# Patient Record
Sex: Female | Born: 1980 | Race: White | Hispanic: No | Marital: Married | Smoking: Never smoker
Health system: Southern US, Community
[De-identification: ages and names within clinical notes are randomized; demographics above are authoritative.]

## PROBLEM LIST (undated history)

## (undated) DIAGNOSIS — IMO0002 Reserved for concepts with insufficient information to code with codable children: Secondary | ICD-10-CM

## (undated) DIAGNOSIS — T7840XA Allergy, unspecified, initial encounter: Secondary | ICD-10-CM

## (undated) DIAGNOSIS — F32A Depression, unspecified: Secondary | ICD-10-CM

## (undated) DIAGNOSIS — R1310 Dysphagia, unspecified: Secondary | ICD-10-CM

## (undated) DIAGNOSIS — R198 Other specified symptoms and signs involving the digestive system and abdomen: Secondary | ICD-10-CM

## (undated) DIAGNOSIS — T8859XA Other complications of anesthesia, initial encounter: Secondary | ICD-10-CM

## (undated) DIAGNOSIS — Z789 Other specified health status: Secondary | ICD-10-CM

## (undated) DIAGNOSIS — T4145XA Adverse effect of unspecified anesthetic, initial encounter: Secondary | ICD-10-CM

## (undated) DIAGNOSIS — F329 Major depressive disorder, single episode, unspecified: Secondary | ICD-10-CM

## (undated) HISTORY — PX: ABDOMINAL SURGERY: SHX537

## (undated) HISTORY — DX: Allergy, unspecified, initial encounter: T78.40XA

---

## 2002-03-17 ENCOUNTER — Emergency Department (HOSPITAL_COMMUNITY): Admission: EM | Admit: 2002-03-17 | Discharge: 2002-03-17 | Payer: Self-pay | Admitting: Emergency Medicine

## 2003-10-15 ENCOUNTER — Emergency Department (HOSPITAL_COMMUNITY): Admission: EM | Admit: 2003-10-15 | Discharge: 2003-10-15 | Payer: Self-pay | Admitting: Emergency Medicine

## 2004-07-10 ENCOUNTER — Other Ambulatory Visit: Admission: RE | Admit: 2004-07-10 | Discharge: 2004-07-10 | Payer: Self-pay | Admitting: Obstetrics and Gynecology

## 2004-07-11 ENCOUNTER — Other Ambulatory Visit: Admission: RE | Admit: 2004-07-11 | Discharge: 2004-07-11 | Payer: Self-pay | Admitting: Obstetrics and Gynecology

## 2005-02-27 ENCOUNTER — Inpatient Hospital Stay (HOSPITAL_COMMUNITY): Admission: AD | Admit: 2005-02-27 | Discharge: 2005-03-03 | Payer: Self-pay | Admitting: Obstetrics and Gynecology

## 2005-02-28 ENCOUNTER — Encounter (INDEPENDENT_AMBULATORY_CARE_PROVIDER_SITE_OTHER): Payer: Self-pay | Admitting: Specialist

## 2005-03-03 ENCOUNTER — Emergency Department (HOSPITAL_COMMUNITY): Admission: EM | Admit: 2005-03-03 | Discharge: 2005-03-03 | Payer: Self-pay | Admitting: Emergency Medicine

## 2005-08-08 ENCOUNTER — Encounter (INDEPENDENT_AMBULATORY_CARE_PROVIDER_SITE_OTHER): Payer: Self-pay | Admitting: *Deleted

## 2005-08-08 LAB — CONVERTED CEMR LAB

## 2005-08-18 ENCOUNTER — Other Ambulatory Visit: Admission: RE | Admit: 2005-08-18 | Discharge: 2005-08-18 | Payer: Self-pay | Admitting: Obstetrics and Gynecology

## 2005-09-04 ENCOUNTER — Emergency Department (HOSPITAL_COMMUNITY): Admission: EM | Admit: 2005-09-04 | Discharge: 2005-09-04 | Payer: Self-pay | Admitting: Emergency Medicine

## 2005-11-10 ENCOUNTER — Emergency Department (HOSPITAL_COMMUNITY): Admission: EM | Admit: 2005-11-10 | Discharge: 2005-11-10 | Payer: Self-pay | Admitting: *Deleted

## 2005-12-09 ENCOUNTER — Emergency Department (HOSPITAL_COMMUNITY): Admission: EM | Admit: 2005-12-09 | Discharge: 2005-12-09 | Payer: Self-pay | Admitting: Family Medicine

## 2006-01-28 ENCOUNTER — Observation Stay (HOSPITAL_COMMUNITY): Admission: AD | Admit: 2006-01-28 | Discharge: 2006-01-29 | Payer: Self-pay | Admitting: Obstetrics & Gynecology

## 2006-01-28 ENCOUNTER — Encounter: Payer: Self-pay | Admitting: Emergency Medicine

## 2006-01-28 ENCOUNTER — Encounter (INDEPENDENT_AMBULATORY_CARE_PROVIDER_SITE_OTHER): Payer: Self-pay | Admitting: Specialist

## 2006-01-28 HISTORY — PX: ECTOPIC PREGNANCY SURGERY: SHX613

## 2006-01-31 ENCOUNTER — Inpatient Hospital Stay (HOSPITAL_COMMUNITY): Admission: AD | Admit: 2006-01-31 | Discharge: 2006-01-31 | Payer: Self-pay | Admitting: Obstetrics and Gynecology

## 2006-02-06 ENCOUNTER — Emergency Department (HOSPITAL_COMMUNITY): Admission: EM | Admit: 2006-02-06 | Discharge: 2006-02-06 | Payer: Self-pay | Admitting: Emergency Medicine

## 2006-02-09 ENCOUNTER — Ambulatory Visit (HOSPITAL_COMMUNITY): Admission: RE | Admit: 2006-02-09 | Discharge: 2006-02-09 | Payer: Self-pay | Admitting: Emergency Medicine

## 2006-04-19 ENCOUNTER — Emergency Department (HOSPITAL_COMMUNITY): Admission: EM | Admit: 2006-04-19 | Discharge: 2006-04-19 | Payer: Self-pay | Admitting: Family Medicine

## 2006-04-19 ENCOUNTER — Emergency Department (HOSPITAL_COMMUNITY): Admission: EM | Admit: 2006-04-19 | Discharge: 2006-04-19 | Payer: Self-pay | Admitting: Emergency Medicine

## 2006-05-12 ENCOUNTER — Ambulatory Visit: Payer: Self-pay | Admitting: Sports Medicine

## 2006-06-16 ENCOUNTER — Ambulatory Visit: Payer: Self-pay | Admitting: Family Medicine

## 2006-07-09 ENCOUNTER — Ambulatory Visit: Payer: Self-pay | Admitting: Sports Medicine

## 2006-07-13 ENCOUNTER — Ambulatory Visit: Payer: Self-pay | Admitting: Family Medicine

## 2006-07-17 ENCOUNTER — Ambulatory Visit: Payer: Self-pay | Admitting: Family Medicine

## 2006-08-05 ENCOUNTER — Ambulatory Visit: Payer: Self-pay | Admitting: Family Medicine

## 2006-09-07 ENCOUNTER — Emergency Department (HOSPITAL_COMMUNITY): Admission: EM | Admit: 2006-09-07 | Discharge: 2006-09-07 | Payer: Self-pay | Admitting: Family Medicine

## 2006-10-28 ENCOUNTER — Ambulatory Visit: Payer: Self-pay | Admitting: Family Medicine

## 2006-11-02 ENCOUNTER — Ambulatory Visit (HOSPITAL_COMMUNITY): Admission: RE | Admit: 2006-11-02 | Discharge: 2006-11-02 | Payer: Self-pay | Admitting: Family Medicine

## 2006-11-02 ENCOUNTER — Ambulatory Visit: Payer: Self-pay | Admitting: Family Medicine

## 2006-11-05 DIAGNOSIS — G43909 Migraine, unspecified, not intractable, without status migrainosus: Secondary | ICD-10-CM | POA: Insufficient documentation

## 2006-11-05 DIAGNOSIS — J45909 Unspecified asthma, uncomplicated: Secondary | ICD-10-CM | POA: Insufficient documentation

## 2006-11-05 DIAGNOSIS — J4521 Mild intermittent asthma with (acute) exacerbation: Secondary | ICD-10-CM | POA: Insufficient documentation

## 2006-11-06 ENCOUNTER — Encounter (INDEPENDENT_AMBULATORY_CARE_PROVIDER_SITE_OTHER): Payer: Self-pay | Admitting: *Deleted

## 2007-03-10 ENCOUNTER — Ambulatory Visit: Payer: Self-pay | Admitting: Family Medicine

## 2007-03-29 ENCOUNTER — Ambulatory Visit: Payer: Self-pay | Admitting: Family Medicine

## 2007-03-31 ENCOUNTER — Telehealth: Payer: Self-pay | Admitting: Family Medicine

## 2007-04-19 ENCOUNTER — Telehealth (INDEPENDENT_AMBULATORY_CARE_PROVIDER_SITE_OTHER): Payer: Self-pay | Admitting: *Deleted

## 2007-04-19 ENCOUNTER — Ambulatory Visit: Payer: Self-pay | Admitting: Sports Medicine

## 2007-04-23 ENCOUNTER — Telehealth: Payer: Self-pay | Admitting: Family Medicine

## 2007-06-16 ENCOUNTER — Emergency Department (HOSPITAL_COMMUNITY): Admission: EM | Admit: 2007-06-16 | Discharge: 2007-06-16 | Payer: Self-pay | Admitting: Emergency Medicine

## 2007-07-05 ENCOUNTER — Telehealth (INDEPENDENT_AMBULATORY_CARE_PROVIDER_SITE_OTHER): Payer: Self-pay | Admitting: *Deleted

## 2007-07-05 ENCOUNTER — Encounter: Payer: Self-pay | Admitting: Family Medicine

## 2007-07-05 ENCOUNTER — Ambulatory Visit: Payer: Self-pay | Admitting: Sports Medicine

## 2007-07-05 LAB — CONVERTED CEMR LAB
ALT: 9 units/L (ref 0–35)
AST: 11 units/L (ref 0–37)
Albumin: 3.8 g/dL (ref 3.5–5.2)
Alkaline Phosphatase: 62 units/L (ref 39–117)
BUN: 10 mg/dL (ref 6–23)
CO2: 20 meq/L (ref 19–32)
Calcium: 9 mg/dL (ref 8.4–10.5)
Chloride: 103 meq/L (ref 96–112)
Creatinine, Ser: 0.53 mg/dL (ref 0.40–1.20)
Glucose, Bld: 79 mg/dL (ref 70–99)
HCT: 41.2 % (ref 36.0–46.0)
Hemoglobin: 13.4 g/dL (ref 12.0–15.0)
MCHC: 32.5 g/dL (ref 30.0–36.0)
MCV: 84.3 fL (ref 78.0–100.0)
Platelets: 287 10*3/uL (ref 150–400)
Potassium: 4 meq/L (ref 3.5–5.3)
RBC: 4.89 M/uL (ref 3.87–5.11)
RDW: 13.8 % (ref 11.5–14.0)
Sodium: 138 meq/L (ref 135–145)
Total Bilirubin: 0.3 mg/dL (ref 0.3–1.2)
Total Protein: 7 g/dL (ref 6.0–8.3)
WBC: 12.6 10*3/uL — ABNORMAL HIGH (ref 4.0–10.5)

## 2007-07-08 ENCOUNTER — Inpatient Hospital Stay (HOSPITAL_COMMUNITY): Admission: AD | Admit: 2007-07-08 | Discharge: 2007-07-09 | Payer: Self-pay | Admitting: Obstetrics and Gynecology

## 2007-07-14 ENCOUNTER — Inpatient Hospital Stay (HOSPITAL_COMMUNITY): Admission: AD | Admit: 2007-07-14 | Discharge: 2007-07-14 | Payer: Self-pay | Admitting: Obstetrics and Gynecology

## 2007-07-22 ENCOUNTER — Inpatient Hospital Stay (HOSPITAL_COMMUNITY): Admission: AD | Admit: 2007-07-22 | Discharge: 2007-07-22 | Payer: Self-pay | Admitting: Obstetrics and Gynecology

## 2007-08-10 ENCOUNTER — Telehealth: Payer: Self-pay | Admitting: *Deleted

## 2007-08-11 ENCOUNTER — Ambulatory Visit: Payer: Self-pay | Admitting: Family Medicine

## 2007-08-20 ENCOUNTER — Ambulatory Visit: Payer: Self-pay | Admitting: Family Medicine

## 2007-08-27 ENCOUNTER — Ambulatory Visit: Payer: Self-pay | Admitting: Family Medicine

## 2007-09-29 ENCOUNTER — Ambulatory Visit: Payer: Self-pay | Admitting: Family Medicine

## 2007-12-16 ENCOUNTER — Inpatient Hospital Stay (HOSPITAL_COMMUNITY): Admission: AD | Admit: 2007-12-16 | Discharge: 2007-12-16 | Payer: Self-pay | Admitting: Obstetrics and Gynecology

## 2007-12-17 ENCOUNTER — Inpatient Hospital Stay (HOSPITAL_COMMUNITY): Admission: AD | Admit: 2007-12-17 | Discharge: 2007-12-17 | Payer: Self-pay | Admitting: Obstetrics and Gynecology

## 2008-01-04 ENCOUNTER — Encounter (INDEPENDENT_AMBULATORY_CARE_PROVIDER_SITE_OTHER): Payer: Self-pay | Admitting: Obstetrics and Gynecology

## 2008-01-04 ENCOUNTER — Inpatient Hospital Stay (HOSPITAL_COMMUNITY): Admission: RE | Admit: 2008-01-04 | Discharge: 2008-01-07 | Payer: Self-pay | Admitting: Obstetrics and Gynecology

## 2008-01-04 HISTORY — PX: TUBAL LIGATION: SHX77

## 2008-03-02 ENCOUNTER — Telehealth: Payer: Self-pay | Admitting: *Deleted

## 2008-03-02 ENCOUNTER — Encounter: Payer: Self-pay | Admitting: Family Medicine

## 2008-03-02 ENCOUNTER — Ambulatory Visit: Payer: Self-pay | Admitting: Family Medicine

## 2008-03-02 DIAGNOSIS — D509 Iron deficiency anemia, unspecified: Secondary | ICD-10-CM | POA: Insufficient documentation

## 2008-03-02 LAB — CONVERTED CEMR LAB: Hemoglobin: 10.3 g/dL

## 2008-03-03 LAB — CONVERTED CEMR LAB
Ferritin: 9 ng/mL — ABNORMAL LOW (ref 10–291)
HCT: 37 % (ref 36.0–46.0)
Hemoglobin: 11.2 g/dL — ABNORMAL LOW (ref 12.0–15.0)
Iron: 20 ug/dL — ABNORMAL LOW (ref 42–145)
MCHC: 30.3 g/dL (ref 30.0–36.0)
MCV: 73.3 fL — ABNORMAL LOW (ref 78.0–100.0)
Platelets: 257 10*3/uL (ref 150–400)
RBC: 5.05 M/uL (ref 3.87–5.11)
RDW: 18.3 % — ABNORMAL HIGH (ref 11.5–15.5)
Saturation Ratios: 6 % — ABNORMAL LOW (ref 20–55)
TIBC: 358 ug/dL (ref 250–470)
UIBC: 338 ug/dL
WBC: 7.4 10*3/uL (ref 4.0–10.5)

## 2008-03-14 ENCOUNTER — Ambulatory Visit: Payer: Self-pay | Admitting: Family Medicine

## 2008-03-14 ENCOUNTER — Encounter: Payer: Self-pay | Admitting: Family Medicine

## 2008-03-14 LAB — CONVERTED CEMR LAB: TSH: 2.231 microintl units/mL (ref 0.350–4.50)

## 2008-03-29 ENCOUNTER — Telehealth: Payer: Self-pay | Admitting: *Deleted

## 2008-04-19 ENCOUNTER — Ambulatory Visit: Payer: Self-pay | Admitting: Family Medicine

## 2008-04-19 DIAGNOSIS — R42 Dizziness and giddiness: Secondary | ICD-10-CM | POA: Insufficient documentation

## 2008-04-19 DIAGNOSIS — H5310 Unspecified subjective visual disturbances: Secondary | ICD-10-CM | POA: Insufficient documentation

## 2008-07-17 ENCOUNTER — Ambulatory Visit: Payer: Self-pay | Admitting: Family Medicine

## 2008-07-17 DIAGNOSIS — G5600 Carpal tunnel syndrome, unspecified upper limb: Secondary | ICD-10-CM | POA: Insufficient documentation

## 2008-07-31 ENCOUNTER — Telehealth: Payer: Self-pay | Admitting: Family Medicine

## 2008-09-22 ENCOUNTER — Ambulatory Visit: Payer: Self-pay | Admitting: Family Medicine

## 2008-09-22 DIAGNOSIS — H8110 Benign paroxysmal vertigo, unspecified ear: Secondary | ICD-10-CM | POA: Insufficient documentation

## 2008-09-29 ENCOUNTER — Telehealth: Payer: Self-pay | Admitting: Family Medicine

## 2008-10-11 ENCOUNTER — Telehealth: Payer: Self-pay | Admitting: Family Medicine

## 2008-12-24 ENCOUNTER — Emergency Department (HOSPITAL_COMMUNITY): Admission: EM | Admit: 2008-12-24 | Discharge: 2008-12-24 | Payer: Self-pay | Admitting: Emergency Medicine

## 2008-12-25 ENCOUNTER — Telehealth: Payer: Self-pay | Admitting: Family Medicine

## 2008-12-25 ENCOUNTER — Encounter: Payer: Self-pay | Admitting: Family Medicine

## 2008-12-25 ENCOUNTER — Ambulatory Visit: Payer: Self-pay | Admitting: Family Medicine

## 2008-12-25 LAB — CONVERTED CEMR LAB: Beta hcg, urine, semiquantitative: NEGATIVE

## 2008-12-26 LAB — CONVERTED CEMR LAB
ALT: 11 units/L (ref 0–35)
AST: 14 units/L (ref 0–37)
Albumin: 4.1 g/dL (ref 3.5–5.2)
Alkaline Phosphatase: 79 units/L (ref 39–117)
BUN: 13 mg/dL (ref 6–23)
Basophils Absolute: 0 10*3/uL (ref 0.0–0.1)
Basophils Relative: 1 % (ref 0–1)
CO2: 26 meq/L (ref 19–32)
Calcium: 9 mg/dL (ref 8.4–10.5)
Chloride: 104 meq/L (ref 96–112)
Creatinine, Ser: 0.63 mg/dL (ref 0.40–1.20)
Eosinophils Absolute: 0.4 10*3/uL (ref 0.0–0.7)
Eosinophils Relative: 5 % (ref 0–5)
Glucose, Bld: 86 mg/dL (ref 70–99)
HCT: 39.8 % (ref 36.0–46.0)
Hemoglobin: 12.6 g/dL (ref 12.0–15.0)
Lymphocytes Relative: 19 % (ref 12–46)
Lymphs Abs: 1.7 10*3/uL (ref 0.7–4.0)
MCHC: 31.7 g/dL (ref 30.0–36.0)
MCV: 80.7 fL (ref 78.0–100.0)
Monocytes Absolute: 1.1 10*3/uL — ABNORMAL HIGH (ref 0.1–1.0)
Monocytes Relative: 13 % — ABNORMAL HIGH (ref 3–12)
Neutro Abs: 5.3 10*3/uL (ref 1.7–7.7)
Neutrophils Relative %: 62 % (ref 43–77)
Platelets: 297 10*3/uL (ref 150–400)
Potassium: 4.3 meq/L (ref 3.5–5.3)
RBC: 4.93 M/uL (ref 3.87–5.11)
RDW: 15.8 % — ABNORMAL HIGH (ref 11.5–15.5)
Sodium: 141 meq/L (ref 135–145)
Total Bilirubin: 0.3 mg/dL (ref 0.3–1.2)
Total Protein: 7.1 g/dL (ref 6.0–8.3)
WBC: 8.5 10*3/uL (ref 4.0–10.5)

## 2008-12-29 ENCOUNTER — Telehealth: Payer: Self-pay | Admitting: *Deleted

## 2009-01-01 ENCOUNTER — Encounter: Payer: Self-pay | Admitting: Family Medicine

## 2009-04-12 ENCOUNTER — Telehealth (INDEPENDENT_AMBULATORY_CARE_PROVIDER_SITE_OTHER): Payer: Self-pay | Admitting: *Deleted

## 2009-04-13 ENCOUNTER — Ambulatory Visit: Payer: Self-pay | Admitting: Family Medicine

## 2009-04-13 ENCOUNTER — Telehealth: Payer: Self-pay | Admitting: *Deleted

## 2010-04-23 ENCOUNTER — Telehealth: Payer: Self-pay | Admitting: Family Medicine

## 2010-06-13 ENCOUNTER — Encounter: Admission: RE | Admit: 2010-06-13 | Discharge: 2010-06-13 | Payer: Self-pay | Admitting: Obstetrics and Gynecology

## 2010-07-24 ENCOUNTER — Encounter: Payer: Self-pay | Admitting: Family Medicine

## 2010-08-03 ENCOUNTER — Emergency Department: Payer: Self-pay | Admitting: Emergency Medicine

## 2010-08-05 ENCOUNTER — Encounter: Payer: Self-pay | Admitting: *Deleted

## 2010-08-05 ENCOUNTER — Telehealth: Payer: Self-pay | Admitting: *Deleted

## 2010-08-05 ENCOUNTER — Ambulatory Visit: Payer: Self-pay | Admitting: Family Medicine

## 2010-08-14 ENCOUNTER — Encounter: Payer: Self-pay | Admitting: *Deleted

## 2010-08-16 ENCOUNTER — Ambulatory Visit: Payer: Self-pay | Admitting: Family Medicine

## 2010-08-16 DIAGNOSIS — L299 Pruritus, unspecified: Secondary | ICD-10-CM | POA: Insufficient documentation

## 2010-10-08 NOTE — Miscellaneous (Signed)
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 she took them to their doctor and although he detected no lice, went ahead and  treated anyway. patient has done all the precautioary measures the doctor advised , washing linen etc. she wants to be checked also . appointment scheduled for Friday afternoon. Theresia Lo RN  August 14, 2010 2:44 PM

## 2010-10-08 NOTE — Assessment & Plan Note (Signed)
Summary: swollen tonsils/eo   Vital Signs:  Patient profile:   30 year old female Weight:      197 pounds Temp:     98 degrees F oral Pulse rate:   66 / minute Pulse rhythm:   regular BP sitting:   97 / 70  (right arm)  Vitals Entered By: Loralee Pacas CMA (August 05, 2010 10:17 AM) CC: swollen tonsils   Primary Care Provider:  . [facility]  CC:  swollen tonsils.  History of Present Illness: 30 yo female here for fu from [facility] ED.  She went in on Sat with ST, swollen tonsils, dehydrated and unable to take by mouth.  Given 3L IVF, felt better.  She also got amoxicillin and oxycodone.  Strep and flu rapid tests were neg.  Overall she feels better today.  No snoring, no hx sleep anea.  POS fevers to 102F and chills, ST, facial pain, body aches.  Tolerating by mouth fluids now.  She is urinating.  No sick contacts.   Migraine:  Needs refill on maxalt.  Current Medications (verified): 1)  Albuterol 90 Mcg/act Aers (Albuterol) .... Inhale 2 Puff Using Inhaler Every Four Hours 2)  Fe Supplement .... One Tab By Mouth Daily 3)  Zofran 4 Mg Tabs (Ondansetron Hcl) .... One Tablet By Mouth Every 6hours As Needed Nausea and Vomiting 4)  Transderm-Scop 1.5 Mg Pt72 (Scopolamine Base) .... Apply To Skin As Directed Every 3 Days As Needed Dizziness and Motion Sickness 5)  Maxalt-Mlt 10 Mg Tbdp (Rizatriptan Benzoate) .... One Tab By Mouth At The First Sign of Migraine, May Repeat in 2h If Symptoms Persist 6)  Mobic 7.5 Mg/53ml Susp (Meloxicam) .... 5 Ml By Mouth Two Times A Day As Needed For Throat Pain. 7)  Tamiflu 12 Mg/ml Susr (Oseltamivir Phosphate) .... 6ml By Mouth Two Times A Day X 5d.  Allergies (verified): No Known Drug Allergies  Review of Systems       SEe HPI  Physical Exam  General:  Well-developed,well-nourished,in no acute distress; alert,appropriate and cooperative throughout examination Head:  Normocephalic and atraumatic without obvious abnormalities. No apparent  alopecia or balding. Eyes:  No corneal or conjunctival inflammation noted. EOMI. Perrl Ears:  External ear exam shows no significant lesions or deformities.  Otoscopic examination reveals clear canals, tympanic membranes are intact bilaterally without bulging, retraction, inflammation or discharge. Hearing is grossly normal bilaterally. Nose:  External nasal examination shows no deformity or inflammation. Nasal mucosa are pink and moist without lesions or exudates. Mouth:  Moist mucosae, tonsils enlarged but not touching.  Able to see posterior oropharynx, small exudate on tonsillar crypt.  erythematous. Neck:  TTP bilaterally with fullness but FROM of neck. Lungs:  Normal respiratory effort, chest expands symmetrically. Lungs are clear to auscultation, no crackles or wheezes. Heart:  Normal rate and regular rhythm. S1 and S2 normal without gallop, murmur, click, rub or other extra sounds. Abdomen:  Bowel sounds positive,abdomen soft and non-tender without masses, organomegaly or hernias noted.   Impression & Recommendations:  Problem # 1:  TONSILLOPHARYNGITIS (ICD-465.8) Assessment New  Symptoms resemble influenza-like-illness.   Finishing course abx. Likely viral. Tamiflu as symptoms started within 48h. Mobic for pain. Toradol in office. RTC 2 weeks if no better.  Orders: [facility]- Est  Level 4 (99214)  Problem # 2:  MIGRAINE, UNSPEC., W/O INTRACTABLE MIGRAINE (ICD-346.90) Assessment: Unchanged Refilled maxalt.  Her updated medication list for this problem includes:    Maxalt-mlt 10 Mg Tbdp (Rizatriptan benzoate) .[facility] Kitchen[facility] Kitchen[facility] Kitchen  One tab by mouth at the first sign of migraine, may repeat in 2h if symptoms persist    Mobic 7.5 Mg/72ml Susp (Meloxicam) .Marland KitchenMarland KitchenMarland KitchenMarland Kitchen 5 ml by mouth two times a day as needed for throat pain.  Orders: Ketorolac-Toradol 15mg  (Z6109) [facility]- Est  Level 4 (60454)  Complete Medication List: 1)  Albuterol 90 Mcg/act Aers (Albuterol) .... Inhale 2 puff using inhaler every four  hours 2)  Fe Supplement  .... One tab by mouth daily 3)  Zofran 4 Mg Tabs (Ondansetron hcl) .... One tablet by mouth every 6hours as needed nausea and vomiting 4)  Transderm-scop 1.5 Mg Pt72 (Scopolamine base) .... Apply to skin as directed every 3 days as needed dizziness and motion sickness 5)  Maxalt-mlt 10 Mg Tbdp (Rizatriptan benzoate) .... One tab by mouth at the first sign of migraine, may repeat in 2h if symptoms persist 6)  Mobic 7.5 Mg/60ml Susp (Meloxicam) .... 5 ml by mouth two times a day as needed for throat pain. 7)  Tamiflu 12 Mg/ml Susr (Oseltamivir phosphate) .... 6ml by mouth two times a day x 5d. Prescriptions: TAMIFLU 12 MG/ML SUSR (OSELTAMIVIR PHOSPHATE) 6mL by mouth two times a day x 5d.  #5d QS. x 0   Entered and Authorized by:   Rodney Langton MD   Signed by:   Rodney Langton MD on 08/05/2010   Method used:   Print then Give to Patient   RxID:   [id number] MOBIC 7.5 MG/5ML SUSP (MELOXICAM) 5 mL by mouth two times a day as needed for throat pain.  #2 weeks QS x 0   Entered and Authorized by:   Rodney Langton MD   Signed by:   Rodney Langton MD on 08/05/2010   Method used:   Print then Give to Patient   RxID:   [id number] MAXALT-MLT 10 MG TBDP (RIZATRIPTAN BENZOATE) One tab by mouth at the first sign of migraine, may repeat in 2h if symptoms persist  #30 x 0   Entered and Authorized by:   Rodney Langton MD   Signed by:   Rodney Langton MD on 08/05/2010   Method used:   Print then Give to Patient   RxID:   [id number]    Medication Administration  Injection # 1:    Medication: Ketorolac-Toradol 15mg     Diagnosis: MIGRAINE, UNSPEC., W/O INTRACTABLE MIGRAINE (ICD-346.90)    Route: IM    Site: R deltoid    Exp Date: 02/07/2012    Lot #: [id number]    Mfr: Hospira    Comments: 30 mg given.     Patient tolerated injection without complications    Given by: Jimmy Footman, CMA (August 05, 2010 11:05 AM)  Orders  Added: 1)  Ketorolac-Toradol 15mg  [[id number]] 2)  [facility]- Est  Level 4 [25956]

## 2010-10-08 NOTE — Progress Notes (Signed)
  Phone Note From Pharmacy   Summary of Call: spoke with pharmacist about tamiflu rx. states that the rx written not available. will substitute 3ml for that with the same directions Initial call taken by: Jimmy Footman, CMA,  August 05, 2010 3:24 PM

## 2010-10-08 NOTE — Progress Notes (Signed)
Summary: Rx Req  Phone Note Refill Request Call back at Home Phone [phone] Message from:  Patient  Refills Requested: Medication #1:  ALBUTEROL 90 MCG/ACT AERS Inhale 2 puff using inhaler every four hours [facility]  Initial call taken by: Clydell Hakim,  April 23, 2010 2:47 PM  Follow-up for Phone Call        spoke with patient and  advised that we need to schedule an appointment since it has been  a year since she has been seen. states she has just started a new job and her insurance does not go into effect until November plus she can not get off work to come in.    she states recently her asthma has been bothering her because she has a little cold, runny nose , sinus drainage , etc. last night had an  asthma attack and she reached for her inahler and it was empty.  thought she would have to go to ER but she sat quietly and sipped on hot tea and gradually improved. Marland Kitchen  advised patient I will have to ask MD about this to see if we can call her inhaler in to last until November . Follow-up by: Theresia Lo RN,  April 23, 2010 3:09 PM  Additional Follow-up for Phone Call Additional follow up Details #1::        Be ok with one refill.  She should come in if any worsening and follow up in november thanks [facility] Additional Follow-up by: Pearlean Brownie MD,  April 23, 2010 3:20 PM    Prescriptions: ALBUTEROL 90 MCG/ACT AERS (ALBUTEROL) Inhale 2 puff using inhaler every four hours  #1 x 0   Entered by:   Theresia Lo RN   Authorized by:   Pearlean Brownie MD   Signed by:   Theresia Lo RN on 04/23/2010   Method used:   [facility]ronically to        [facility]       [address]       [place], [place]  [address]       Ph: [phone]       Fax: [phone]   RxID:   [id number]   Appended Document: Rx Req patient notified.

## 2010-10-08 NOTE — Assessment & Plan Note (Signed)
Summary: wants to be checked for lice, see note/ls   Vital Signs:  Patient profile:   30 year old female Height:      62 inches Weight:      198.3 pounds BMI:     36.40 Temp:     98.4 degrees F oral Pulse rate:   82 / minute BP sitting:   111 / 72  (right arm) Cuff size:   regular  Vitals Entered By: Garen Grams LPN (August 16, 2010 2:57 PM) CC: wants to be checked for scabies Is Patient Diabetic? No Pain Assessment Patient in pain? no        Primary Care Kady Toothaker:  . [facility]  CC:  wants to be checked for scabies.  History of Present Illness: 1) Scabies?: Notified of scabies outbreak at [employer] school - child was not itching but was treated for scabies presumptively by pediatrician. Mom has started itching a few days ago, as did her boyfriend. Itching is intense and is mostly confined to hands and and arms (some itching on back as well). Does have a history of dry skin and "winter itch" - tries to to use moisturizer cream.   Denies fever, chills, nausea, emesis, diarrhea, pain, hives, chemical contatcs  Habits & Providers  Alcohol-Tobacco-Diet     Tobacco Status: never     Passive Smoke Exposure: no  Medications Prior to Update: 1)  Albuterol 90 Mcg/act Aers (Albuterol) .... Inhale 2 Puff Using Inhaler Every Four Hours 2)  Fe Supplement .... One Tab By Mouth Daily 3)  Zofran 4 Mg Tabs (Ondansetron Hcl) .... One Tablet By Mouth Every 6hours As Needed Nausea and Vomiting 4)  Transderm-Scop 1.5 Mg Pt72 (Scopolamine Base) .... Apply To Skin As Directed Every 3 Days As Needed Dizziness and Motion Sickness 5)  Maxalt-Mlt 10 Mg Tbdp (Rizatriptan Benzoate) .... One Tab By Mouth At The First Sign of Migraine, May Repeat in 2h If Symptoms Persist 6)  Mobic 7.5 Mg/82ml Susp (Meloxicam) .... 5 Ml By Mouth Two Times A Day As Needed For Throat Pain. 7)  Tamiflu 12 Mg/ml Susr (Oseltamivir Phosphate) .... 6ml By Mouth Two Times A Day X 5d.  Allergies (verified): No Known Drug  Allergies  Physical Exam  General:  obese, pleasant, NAD  Skin:  dry skin excoriations bilateral forearms  no other rashes or lesions noted     Impression & Recommendations:  Problem # 1:  PRURITUS (ICD-698.9) Assessment New  Given reported history of possible scabies exposure will treat as scabies with permethrin cream - given enough for household contacts. Handout for management given. Also consider xerosis as likely cause - advised to use Eucerin or Aquaphor especially during winter time and to use mild opical hydrocortisone over the counter to help if itching continues after treatment for scabies. Follow up as needed.  Orders: [facility]- Est Level  3 (91478)  Complete Medication List: 1)  Albuterol 90 Mcg/act Aers (Albuterol) .... Inhale 2 puff using inhaler every four hours 2)  Fe Supplement  .... One tab by mouth daily 3)  Zofran 4 Mg Tabs (Ondansetron hcl) .... One tablet by mouth every 6hours as needed nausea and vomiting 4)  Transderm-scop 1.5 Mg Pt72 (Scopolamine base) .... Apply to skin as directed every 3 days as needed dizziness and motion sickness 5)  Maxalt-mlt 10 Mg Tbdp (Rizatriptan benzoate) .... One tab by mouth at the first sign of migraine, may repeat in 2h if symptoms persist 6)  Mobic 7.5 Mg/3ml Susp (  Meloxicam) .... 5 ml by mouth two times a day as needed for throat pain. 7)  Tamiflu 12 Mg/ml Susr (Oseltamivir phosphate) .... 6ml by mouth two times a day x 5d. 8)  Permethrin 5 % Crea (Permethrin) .... Apply head to toe x 1 treatment (wash off after 12 hours) - may repeat in one week if still itching. disp one tube 60 g  Patient Instructions: 1)  It was great to see you today!  2)  Follow the instructions on the scabies handout  3)  Use the permethrin cream as instructed.  4)  Use Eucerin or Aquaphor two times a day for moisturizing your skin. 5)  If after you use the cream for scabies and you are still itching you can use otc hydrocortisone to help with itching.    Prescriptions: PERMETHRIN 5 % CREA (PERMETHRIN) Apply head to toe x 1 treatment (wash off after 12 hours) - may repeat in one week if still itching. disp one tube 60 g  #2 x 3   Entered and Authorized by:   Bobby Rumpf  MD   Signed by:   Bobby Rumpf  MD on 08/16/2010   Method used:   Electronically to        [facility]       [address]       [place], [place]  [address]       Ph: [phone]       Fax: [phone]   RxID:   [id number]    Orders Added: 1)  [facility]- Est Level  3 [84132]

## 2010-10-08 NOTE — Miscellaneous (Signed)
   Clinical Lists Changes  Problems: Changed problem from ASTHMA, UNSPECIFIED (ICD-493.90) to ASTHMA, INTERMITTENT (ICD-493.90) 

## 2010-10-08 NOTE — Letter (Signed)
Summary: Out of Work  [facility]  [address]   [place], [place] [address]   Phone: [phone]  Fax: [phone]    August 05, 2010   Employee:  Makana R ATKINS    To Whom It May Concern:   For Medical reasons, please excuse the above named employee from work for the following dates:  August 05, 2010  If you need additional information, please feel free to contact our office.         Sincerely,    Jimmy Footman, CMA for Rodney Langton, MD

## 2010-11-12 ENCOUNTER — Encounter: Payer: Self-pay | Admitting: *Deleted

## 2011-01-21 NOTE — Op Note (Signed)
Deanna Thomas, Deanna Thomas              ACCOUNT NO.:  [id number]   MEDICAL RECORD NO.:  [id number]          PATIENT TYPE:  INP   LOCATION:  9120                          [facility]ILITY:  [facility]   PHYSICIAN:  Randye Lobo, M.D.   DATE OF BIRTH:  [birth year]   DATE OF PROCEDURE:  01/04/2008  DATE OF DISCHARGE:                               OPERATIVE REPORT   PREOPERATIVE DIAGNOSES:  1. Intrauterine gestation at 39 weeks.  2. History of prior cesarean section, desires repeat cesarean section.  3. Desires permanent sterilization.   POSTOPERATIVE DIAGNOSES:  1. Intrauterine gestation at 39 weeks.  2. History of prior cesarean section, desires repeat cesarean section.  3. Desires permanent sterilization.  4. History of distal left salpingectomy.   PROCEDURE:  Repeat low segment transverse cesarean section and right  tubal ligation by the modified Pomeroy technique.   SURGEON:  Randye Lobo, MD   ASSISTANT:  Luvenia Redden, MD   ANESTHESIA:  Spinal.   IV FLUIDS:  1800 mL Ringer's lactate.   ESTIMATED BLOOD LOSS:  700 mL   URINE OUTPUT:  300 mL   COMPLICATIONS:  None.   INDICATIONS FOR PROCEDURE:  The patient is a 30 year old gravida 2, para  1-0-1-1 Caucasian female at 34 weeks' gestation, who has a history of a  prior cesarean section for failure to descend, and who throughout her  current pregnancy has desired a repeat cesarean delivery.  The patient  also desires permanent sterilization.  She does have a history of a left  ectopic pregnancy and has undergone a partial left salpingectomy.  A  plan is now made to proceed with a repeat cesarean section and tubal  ligation after the risks, benefits, and alternatives are reviewed.  The  patient was quoted a failure rate of the tubal ligation of approximately  1 in 250 to 1 in 300, which may result in either an intrauterine or an  ectopic pregnancy.   FINDINGS:  A viable female was delivered at 12:31 with Apgars of 9 at 1  minute and 9  at 5 minutes.  The weight was 8 pounds 6 ounces.  The  amniotic fluid was clear.  The placenta had a normal insertion of a 3-  vessel cord.  The distal left fallopian tube was noted to be absent.  The right fallopian tube, ovaries, and uterus were unremarkable.   SPECIMENS:  A portion of the right fallopian tube ws sent to pathology   PROCEDURE:  The patient was reidentified in the preoperative holding  area.  She received Ancef 1 gm IV for antibiotic prophylaxis.  In the  operating room, a spinal anesthetic was administered and the patient was  then placed in the supine position.  The abdomen was sterilely prepped  and a Foley catheter placed inside the bladder.  She was sterilely  draped.   A Pfannenstiel incision was created sharply with a scalpel along the  line of the patient's previous Pfannenstiel incision.  This was carried  down to the fascia using a scalpel.  Monopolar cautery was used to  create hemostasis.  The  fascia was then incised in the midline and the  fascial incision was extended with a Mayo scissors bilaterally.  The  rectus muscles were sharply dissected off of the fascia superiorly and  inferiorly.  The rectus muscles were then sharply divided in the  midline.  The parietal peritoneum was grasped with two hemostat clamps  and was entered sharply with a Metzenbaum scissors.  The peritoneal  incision was extended cranially and caudally using the same.   The lower uterine segment was exposed with a bladder retractor and the  bladder flap was sharply created.  A transverse lower uterine segment  incision was then created with a scalpel.  The uterine incision was  extended bilaterally in an upward fashion with a bandage scissors.  A  hand was inserted through the uterine incision and the vertex was  delivered without difficulty.  The nares and mouth were suctioned and  then the remainder of the newborn was delivered.  The umbilical cord was  doubly clamped and cut  and the newborn was carried over to the awaiting  pediatricians in vigorous condition.   The placenta was manually extracted at this time and was set aside for  cord blood donation and cord blood collection.  The uterus was  exteriorized and it was wiped and cleaned with a moistened lap pad.  The  uterine incision was then closed with a single running locked layer of  #1 chromic.  There was a small hematoma along the superior right apical  portion of the incision and this was treated with a figure-of-eight  suture of #1 chromic followed by a horizontal mattress suture of the  same.  This controlled bleeding from the hematoma and compressed it.   The fallopian tubes were then examined at this time for tubal ligation.  The findings were as noted above.  The right fallopian tube was grasped  with a Babcock clamp and followed all the way to its fimbriated end.  A  free tie of 0 plain gut suture was then placed around the isthmic  portion of the fallopian tube.  A hemostat clamp was brought through the  mesosalpinx and an additional tie of 2-0 plain gut suture was placed at  the base of each loop of fallopian tube.  The intervening portion was  sharply excised and was sent to pathology.  Hemostasis was good.   The uterine incision was reexamined at this time and it was found to be  hemostatic.  The uterus was returned to the peritoneal cavity at this  time.   The abdomen was closed.  The parietal peritoneum was closed with a  running suture of 3-0 Vicryl.  The rectus muscles were reapproximated in  the midline with interrupted sutures of #1 chromic.  The fascia was  closed with a running suture of 0 Vicryl.  The subcutaneous layer was  irrigated and suctioned and made hemostatic with monopolar cautery.  Interrupted sutures of 2-0 plain were placed in the subcutaneous layer.  The skin was closed with staples and a sterile bandage was placed over  this.   This concluded the patient's  procedure.  There were no complications.  All needle, instrument, and sponge counts were correct.  The patient is  escorted to the recovery room in stable and awake condition.      Randye Lobo, M.D.  Electronically Signed     BES/MEDQ  D:  01/04/2008  T:  01/05/2008  Job:  [id number]

## 2011-01-24 NOTE — Discharge Summary (Signed)
NAMEMATHA, MASSE              ACCOUNT NO.:  [id number]   MEDICAL RECORD NO.:  [id number]          PATIENT TYPE:  INP   LOCATION:  9109                          [facility]ILITY:  [facility]   PHYSICIAN:  Carrington Clamp, M.D. DATE OF BIRTH:  12/19/1980   DATE OF ADMISSION:  02/27/2005  DATE OF DISCHARGE:  03/03/2005                                 DISCHARGE SUMMARY   FINAL DIAGNOSES:  1.  Intrauterine pregnancy at 39-2/[redacted] weeks gestation.  2.  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  3.  Occiput posterior presentation.   PROCEDURE:  Primary low segment transverse cesarean section.   SURGEON:  Randye Lobo, M.D.   COMPLICATIONS:  None.   This 30 year old G1, P0 presents at 39-2/7 weeks of gestation for induction  of labor secondary to oligohydramnios.  The patient had presented to the  office reporting possible leakage of vaginal fluid but on physical exam was  noted to have no evidence of ruptured membranes.  Ultrasound performed did  show a low AFI, however, and cervix was about 1 cm dilated, 70% effaced, and  -1 at that time.  Therefore, she was admitted for an induction.  The  patient's antepartum course up to this point had been uncomplicated except  for this now presenting low amniotic fluid level.  The patient was started  on Pitocin.  Received an epidural for anesthesia and on the morning of February 28, 2005, IUPCs were placed.  The patient went on to dilate to complete and  complete.  After pushing for about 1-1/2 hours, the baby did not descend  past the +1 station and caplet was developing.  At this point, a discussion  was held with the patient and decision was made to proceed with a cesarean  section.  The patient was taken to the operating room on February 28, 2005,  where a primary low segment transverse cesarean section was performed with  the delivery of a 7 pound 14 ounce female infant with Apgars of 9 and 9.  Delivery went without complications.  The patient's postoperative course was  benign  without significant fevers.  She did have some postoperative anemia  and will be started on some iron.  The little boy was circumcised before  discharge.  She was felt ready for discharge on postoperative day #3.  She  was sent home on a regular diet.  Told to decrease activity, told to  continue her vitamins and her iron supplement daily.  Was given Percocet one  to two q.4h. as needed for pain.  Told she could use up to 600 mg of Motrin  q.6h. as needed for pain.  Was to follow up in the office in 4 to 6 weeks.   LABORATORIES ON DISCHARGE:  The patient had a hemoglobin of 7.2, white blood  cell count of 21.8, and platelets of 272,000.      Leilani Able, P.A.-C.      Carrington Clamp, M.D.  Electronically Signed    MB/MEDQ  D:  03/24/2005  T:  03/24/2005  Job:  [id number]

## 2011-01-24 NOTE — Discharge Summary (Signed)
[name], Deanna Thomas              ACCOUNT NO.:  [id number]   MEDICAL RECORD NO.:  [id number]          PATIENT TYPE:  INP   LOCATION:  9120                          [facility]ILITY:  [facility]   PHYSICIAN:  Malva Limes, M.D.    DATE OF BIRTH:  02-Feb-1981   DATE OF ADMISSION:  01/04/2008  DATE OF DISCHARGE:  01/07/2008                               DISCHARGE SUMMARY   FINAL DIAGNOSES:  1. Intrauterine pregnancy at 70 weeks' gestation.  2. History of prior cesarean section.  The patient desires repeat      cesarean section.  3. The patient desires permanent sterilization.   PROCEDURE:  Repeat low-transverse cesarean section and right tubal  ligation using the modified Pomeroy technique.   SURGEON:  Randye Lobo, MD   ASSISTANT:  Luvenia Redden, MD   COMPLICATIONS:  None.   This 30 year old G2, P 1-0-1-1 presents at 18 weeks' gestation for  repeat cesarean section.  The patient had a prior cesarean section with  her first delivery secondary to failure to descend and has desired to  repeat cesarean section throughout this pregnancy and the patient also  desires permanent sterilization.  This will only need to be performed on  the right because she did have a history of a partial left salpingectomy  secondary to a past ectopic pregnancy.  The patient's antepartum course  otherwise up to this point had been uncomplicated.  She did have a  negative group B strep culture obtained in our office.  The patient was  taken to the operating room on January 04, 2008 by Dr. Conley Simmonds where a  repeat low-segment transverse cesarean section was performed with the  delivery of a 8 pounds 6 ounces female infant with Apgars of 9 and 9.  Delivery went without complications.  A right tubal ligation was  performed using a modified Pomeroy technique and this went without  complications as well.  The patient's postoperative course was benign  without any significant fevers.  The patient did have some mild  postoperative anemia and was started on iron during her postoperative  course.  She was felt ready for discharge on postoperative day #3.  She  was sent home on a regular diet, told to decrease activities, told to  continue her prenatal vitamins and her iron supplement daily, was given  Percocet 1-2 every 4-6 hours as needed for her pain, and was to follow  up in our office in 4 weeks.  Instructions and precautions were reviewed  with the patient.   DISCHARGE LABORATORY DATA:  The patient had a hemoglobin of 7.7, white  blood cell count of 15.2, and platelets of 254,000.      Leilani Able, P.A.-C.    ______________________________  Malva Limes, M.D.    MB/MEDQ  D:  01/27/2008  T:  01/28/2008  Job:  [id number]

## 2011-01-24 NOTE — Op Note (Signed)
Deanna Thomas, Deanna Thomas              ACCOUNT NO.:  [id number]   MEDICAL RECORD NO.:  [id number]          PATIENT TYPE:  INP   LOCATION:  9109                          [facility]   PHYSICIAN:  Randye Lobo, M.D.   DATE OF BIRTH:  1981/08/14   DATE OF PROCEDURE:  02/28/2005  DATE OF DISCHARGE:                                 OPERATIVE REPORT   PREOPERATIVE DIAGNOSES:  1.  Intrauterine gestation at 64 plus 2 weeks.  2.  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   POSTOPERATIVE DIAGNOSES:  1.  Intrauterine gestation at 81 plus 2 weeks.  2.  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  3.  Occiput posterior presentation.   PROCEDURE:  Primary low segment transverse cesarean section.   SURGEON:  Randye Lobo, MD   ANESTHESIA:  Epidural.   IV FLUIDS:  2600 mL Ringer's lactate.   ESTIMATED BLOOD LOSS:  1000 mL.   URINE OUTPUT:  200 mL.   COMPLICATIONS:  None.   INDICATIONS FOR PROCEDURE:  The patient is a 30 year old gravida 1, para 0,  Caucasian female who was admitted at 12 plus one weeks' gestation on February 27, 2005, for induction of labor at term for low amniotic fluid volume.  The  patient had presented to the office reporting possible leakage of vaginal  fluid but on physical examination was noted to have no evidence of ruptured  membranes on speculum examination.  Ultrasound performed in the office  documented low amniotic fluid, however.  The cervix was 1 cm dilated and 70%  effaced with the vertex at the -1 station.   The patient was started on Pitocin.  She received an epidural for  anesthesia.  In the morning on February 28, 2005, an IUPC was placed.  The  patient went on to have complete cervical dilatation.  The fetal heart rate  tracing throughout labor remained reactive and reassuring.  After the  patient had pushed for approximately 1-1/2 hours, there was no descent of  the fetal vertex below the +1 station.  There was caput developing.  The  vertex was thought to be in the occiput posterior  presentation.   At this time, a decision was made to proceed with a primary cesarean section  for failure to descend after risks and benefits were reviewed with the  patient.  The patient's surgery was delayed due to an emergency surgery for  another the patient, and the patient and the fetus were monitored during  this time delay and were noted to be stable.  The Pitocin was discontinued  during this time.   FINDINGS:  A viable female was delivered at 2331 in the occiput posterior  presentation.  Apgars were 9 at one minute and 9 at five minutes.  The  amniotic fluid was noted to be clear.  The weight was 7 pounds 14 ounces.  The placenta had a normal insertion of a three-vessel cord.  The uterus,  tubes, and ovaries were normal.   SPECIMEN:  The placenta was sent to pathology.   PROCEDURE:  The patient was taken from her labor and delivery room  down to  the operating room.  Fetal monitoring continued while the epidural was dosed  for surgical anesthesia.   The patient previously had a Foley catheter placed inside her bladder.  The  abdomen was sterilely prepped and draped.  A Pfannenstiel incision was then  created with the scalpel.  This was carried down to the fascia using the  scalpel.  Hemostasis was created in the subcutaneous layer with monopolar  cautery.  The fascia was incised bilaterally with the Mayo scissors.  The  rectus muscles were dissected off of the overlying fascia superiorly and  inferiorly.  The rectus muscles were then divided in the midline with a  combination of sharp and blunt dissection.  The parietal peritoneum was  elevated with a hemostat clamp and was entered sharply with the Metzenbaum  scissors.  The peritoneal incision was extended cranially and caudally.   The bladder retractor was placed over the bladder and the lower uterine  segment was exposed.  The bladder flap was sharply created.  A transverse  lower uterine segment incision was then created  with a scalpel.  The  incision was extended bilaterally in an upward fashion using the bandage  scissors.  A hand was inserted through the uterine incision and the vertex  was delivered without difficulty.  The nares and mouth were suctioned and  the remainder of the newborn was delivered.  The cord was doubly clamped and  cut and the newborn was carried over to the leading the awaiting  pediatricians in vigorous condition.   Cord blood was obtained and the placenta was then manually extracted.  A  moistened lap pad was used to wipe the uterine cavity clean.  The uterine  incision was then closed with a double-layer closure of #1 chromic.  The  first with a running locked layer and the second was an imbricating layer.  There was some bleeding which was noted medial to each of the apices  bilaterally, and figure-of-eight sutures of #1 chromic were placed along the  uterine incision to create excellent hemostasis.   The uterus was returned to the peritoneal cavity as it had been exteriorized  for its closure.  The abdomen was then irrigated and suctioned and the  uterine incision was found to be hemostatic.  The abdomen was closed at this  time.  A running suture of 3-0 Vicryl was used to close the parietal  peritoneum.  The rectus muscles were reapproximated in the midline with  interrupted sutures of #1 chromic.  The fascia was closed with a running  suture of 0 Vicryl.  The subcutaneous tissue was irrigated and suctioned and  made hemostatic with monopolar cautery.  Interrupted sutures of 3-0 plain  were placed in the subcutaneous layer.  The skin was closed with staples and  a sterile pressure bandage was placed over this.   This concluded the patient's surgery.  There were no complications.  All  needle, instrument, sponge counts were correct.  The patient was escorted to  the recoveryromm in stable and awake condition.     BES/MEDQ  D:  03/01/2005  T:  03/01/2005  Job:  [id number]

## 2011-01-24 NOTE — Op Note (Signed)
Deanna Thomas, Deanna Thomas              ACCOUNT NO.:  [id number]   MEDICAL RECORD NO.:  [id number]          PATIENT TYPE:  OBV   LOCATION:  9302                          [facility]   PHYSICIAN:  Gerrit Friends. Aldona Bar, M.D.   DATE OF BIRTH:  1981/05/15   DATE OF PROCEDURE:  01/28/2006  DATE OF DISCHARGE:                                 OPERATIVE REPORT   PREOPERATIVE DIAGNOSIS:  Suspected ectopic pregnancy, blood type A+.   PROCEDURE:  Diagnostic laparoscopy mini laparotomy and partial left  salpingectomy.   SURGEON:  Dr. Aldona Bar.   ANESTHESIA:  General orotracheal.   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  Her evaluation in the  emergency room at [facility] consisted of labs which found a  quantitative HCG level 555 and an ultrasound which revealed no free fluid  and essentially no intrauterine pregnancy and nothing really to suggest an  obvious ectopic pregnancy.  The patient's pain persisted but was controlled  with intravenous analgesia.  I was called at approximately 4:00 p.m. and  suggested transfer of the patient to [facility] for complete  evaluation of an ectopic pregnancy which in my mind was very likely  considering the history.  The patient was transferred and subsequent  discussion with the patient in triage further confirmed  the necessity in my  mind of ruling out an ectopic pregnancy.   OPERATIVE PROCEDURE:  The patient was taken to the operating room where  after satisfactory induction of general endotracheal anesthesia, she was  prepped and draped having placed in the short Allen stirrups in modified  lithotomy position.  A Foley catheter was placed in the bladder and after  the patient was prepped and appropriately draped,  diagnostic laparoscopy was  begun.  A 1 cm subumbilical midline transversus skin incision was made and  through this, the large trocar and sleeve was introduced without difficulty.  The large trocar was removed and through the large sleeve the laparoscope  was introduced with good visualization of intra-abdominal structures.  At  this time a pneumoperitoneum was created with approximately 3 L of carbon  dioxide gas.  It became obvious that there was a hemoperitoneum.  It  appeared as if there was an ectopic pregnancy in the distal portion of left  fallopian tube.   At this time under direct visualization through a 5 mm suprapubic transverse  midline incision, the accessory trocar sleeve was introduced.  The accessory  trocar was removed and through the accessory sleeve, the grasping instrument  was introduced.  The right fallopian tube and right ovary appeared normal.  The left fallopian tube had essentially been destroyed from about the  midportion of the left fallopian tube to the fimbria and there was a large  amount of blood and clot around the fimbria of the left fallopian tube.  There was approximately 100 mL of free blood in the cul-de-sac and around  the uterus.  The left ovary appeared normal but was very close to the  distorted part of the left fallopian tube.  Decision was made at this time  to end the laparoscopy and proceed to minilaparotomy for precise treatment  of this problem.  Accordingly, all instruments were removed and the  subumbilical incision was closed with 3-0 Vicryl in a subcuticular fashion -  interrupted.  At this time with the patient better positioned, a mini  laparotomy for Pfannenstiel incision was made.  The incision was made down  to the fascia without difficulty.  The fascia was incised in a low  transverse fashion.  Subfascial space was created inferiorly and superiorly,  muscles separated in the midline.  Peritoneum was identified with ease,   remainder of pneumoperitoneum relieved and using the suction apparatus, the  hemoperitoneum was removed.  At this time the left fallopian tube was  identified and with minimal difficulty it was possible to dissect away the  left fallopian tube from the left ovary and essentially a left partial  salpingectomy was carried out using hemostats and suture of 2-0 Vicryl.  The  majority of the distal portion of left fallopian tube was removed in two  segments, one being the fimbriated portion of the tube and the second being  probably the distal two-thirds of the fallopian tube which essentially had  been distorted by the ectopic pregnancy.  At conclusion of removal, the site  was hemostatic and dry.  Further irrigation was carried out and at this time  with no further intra-abdominal pathology appreciated and no foreign bodies  left the abdominal cavity and all counts being correct, the abdominal  peritoneum was closed with 2-0 Vicryl in a running fashion and muscles  secured with same.  Thereafter the fascia was reapproximated from angle to  midline using 0 Vicryl in running fashion.  Subcutaneous tissue rendered  hemostatic and staples were used to close skin.  Sterile pressure dressing  was applied.   In summary, this patient presented with a suspected ectopic pregnancy which  was confirmed in the operating room and she underwent a left partial  salpingectomy through a minilaparotomy incision after diagnostic  laparoscopy.  The patient will be transferred to the recovery room and  thereafter hopefully observed for 24 hours prior to discharge.  Condition on  arrival in recovery is satisfactory.      Gerrit Friends. Aldona Bar, M.D.  Electronically Signed     RMW/MEDQ  D:  01/28/2006  T:  01/29/2006  Job:  [id number]   cc:   Devoria Albe, M.D.  [address]  [place], [place] [address]

## 2011-01-24 NOTE — Discharge Summary (Signed)
Deanna, Thomas              ACCOUNT NO.:  [id number]   MEDICAL RECORD NO.:  [id number]          PATIENT TYPE:  OBV   LOCATION:  9302                          [facility]   PHYSICIAN:  Gerrit Friends. Aldona Bar, M.D.   DATE OF BIRTH:  Jan 25, 1981   DATE OF ADMISSION:  01/28/2006  DATE OF DISCHARGE:  01/29/2006                                 DISCHARGE SUMMARY   DISCHARGE DIAGNOSIS:  Left tubal ectopic pregnancy with hemoperitoneum,  blood type A+.   PROCEDURES:  Diagnostic laparoscopy, minilaparotomy through a Pfannenstiel  incision, evacuation of hemoperitoneum left salpingectomy.   SUMMARY:  This 30 year old, gravida 2, para 1 presented to the emergency  room at [facility] at approximately 10:00 a.m. to 11:00 a.m. on the  morning of May 23 with the acute onset of exquisite left lower quadrant  pain.  Her evaluation revealed a quantitative HCG level of 555 with an  ultrasound that was inconclusive. I was called at approximately 4:00 p.m.  and felt that the further workup needed to rule out an ectopic pregnancy and  suggested transfer to [facility] hospital.  The patient was transferred to  [facility] and after my evaluation, she was taken to the operating  room at which time she underwent a diagnostic laparoscopy with intra-  abdominal findings of a hemoperitoneum and a ruptured tubal ectopic  pregnancy in the left fallopian tube and subsequently underwent a mini  laparotomy through a small Pfannenstiel incision with a left salpingectomy  accomplished and evacuation of the hemoperitoneum.  Her postoperative course  was unremarkable and on the afternoon May 24,  she was discharged to home  with all appropriate instructions.   She will return to maternity admissions on Saturday the 26th or Sunday the  27th to have her staples removed and her wound Steri-Stripped.  She will  return to the office in followup the week of the 12th of June.   MEDICATIONS AT DISCHARGE:  1.   Motrin 600 mg every 6 hours.  2.  Tylox 1-2 every 4-6 hours as needed for more severe pain.  3.  She will begin Depo-Provera 150 mg IM every 3 months in approximately      one weeks' time.   She was given a prescription for all those medications.  In addition for her  allergies and allergic asthma, she was given a prescription for Zyrtec 10 mg  to use daily as well as Advair inhaler 250/50 - 1 puff every 12 hours.  As  mentioned, all appropriate instructions were given to the patient at the  time of discharge and were well understood by the patient and follow-up will  be carried out in the office the week of the 12th of June or as needed.   CONDITION ON DISCHARGE:  Improved.      Gerrit Friends. Aldona Bar, M.D.  Electronically Signed     RMW/MEDQ  D:  01/29/2006  T:  01/30/2006  Job:  [id number]

## 2011-03-07 ENCOUNTER — Encounter: Payer: Self-pay | Admitting: Family Medicine

## 2011-03-07 ENCOUNTER — Ambulatory Visit (INDEPENDENT_AMBULATORY_CARE_PROVIDER_SITE_OTHER): Payer: 59 | Admitting: Family Medicine

## 2011-03-07 VITALS — BP 106/71 | HR 84 | Temp 98.1°F | Wt 226.0 lb

## 2011-03-07 DIAGNOSIS — R600 Localized edema: Secondary | ICD-10-CM

## 2011-03-07 DIAGNOSIS — R609 Edema, unspecified: Secondary | ICD-10-CM

## 2011-03-07 NOTE — Patient Instructions (Addendum)
Edema Edema is an abnormal build-up of fluids in tissues. Because this is partly dependent on gravity (water flows to the lowest place), it is more common in the lower extremities (legs and thighs). It is also common in the looser tissues, like around the eyes. Painless swelling of the feet and ankles is common and increases as a person ages. It may affect both legs and may include the calves or even thighs. When squeezed, the fluid may move out of the affected area and may leave a dent for a few moments. CAUSES  Prolonged standing or sitting in one place for extended periods of time. Movement helps pump tissue fluid into the veins, and absence of movement prevents this, resulting in edema.   Varicose veins. The valves in the veins do not work as well as they should. This causes fluid to leak into the tissues.   Fluid and salt overload.   Injury, burn, or surgery to the leg, ankle, or foot, may damage veins and allow fluid to leak out.   Sunburn damages vessels. Leaky vessels allow fluid to go out into the sunburned tissues.   Allergies (from insect bites or stings, medications or chemicals) cause swelling by allowing vessels to become leaky.   Protein in the blood helps keep fluid in your vessels. Low protein, as in malnutrition, allows fluid to leak out.   Hormonal changes, including pregnancy and menstruation, cause fluid retention. This fluid may leak out of vessels and cause edema.   Medications that cause fluid retention. Examples are sex hormones, blood pressure medications, steroid treatment, or anti-depressants.   Some illnesses cause edema, especially heart failure, kidney disease, or liver disease.   Surgery that cuts veins or lymph nodes, such as surgery done for the heart or for breast cancer, may result in edema.  DIAGNOSIS Your caregiver is usually easily able to determine what is causing your swelling (edema) by simply asking what is wrong (getting a history) and examining  you (doing a physical). Sometimes x-rays, EKG (electrocardiogram or heart tracing), and blood work may be done to evaluate for underlying medical illness. TREATMENT General treatment includes:  Leg elevation (or elevation of the affected body part).   Restriction of fluid intake.   Prevention of fluid overload.   Compression of the affected body part. Compression with elastic bandages or support stockings squeezes the tissues, preventing fluid from entering and forcing it back into the blood vessels.   Diuretics (also called water pills or fluid pills) pull fluid out of your body in the form of increased urination. These are effective in reducing the swelling, but can have side effects and must be used only under your caregiver's supervision. Diuretics are appropriate only for some types of edema.  The specific treatment can be directed at any underlying causes discovered. Heart, liver, or kidney disease should be treated appropriately. HOME CARE INSTRUCTIONS  Elevate the legs (or affected body part) above the level of the heart, while lying down.   Avoid sitting or standing still for prolonged periods of time.   Avoid putting anything directly under the knees when lying down, and do not wear constricting clothing or garters on the upper legs.   Exercising the legs causes the fluid to work back into the veins and lymphatic channels. This may help the swelling go down.   The pressure applied by elastic bandages or support stockings can help reduce ankle swelling.   A low-salt diet may help reduce fluid retention and decrease the   ankle swelling.   Take any medications exactly as prescribed.  SEEK MEDICAL CARE IF:  Your edema is not responding to recommended treatments.  SEEK IMMEDIATE MEDICAL CARE IF:  You develop shortness of breath or chest pain.   You cannot breathe when you lay down; or if, while lying down, you have to get up and go to the window to get your breath.   You are  having increasing swelling without relief from treatment.   You develop a fever over 101.4   You develop pain or redness in the areas that are swollen.  MAKE SURE YOU:  Understand these instructions.   Will watch your condition.   Will get help right away if you are not doing well or get worse.  Document Released: 08/25/2005 Document Re-Released: 02/12/2010 [facility] Patient Information 2011 [place], [place].

## 2011-03-07 NOTE — Assessment & Plan Note (Addendum)
The differentials for b/l LE edema are broad. DVT unlikely and pt denies calf pain so D-dimer and LE doppler not needed. I am considering extravascular overload 2/2 to decreased oncotic pressure due to hypoalbuminemia or anemia.  Anemia very possible given pt's history of iron deficiency anemia and she not currently on iron therapy. Ordered CBC and CMET to assess. Also renal insufficiency/nephrotic syndrome is on the list of differentials. Will assess with CMET. Also hypothyroidism given weight gain and edema. Will asses with TSH. IF all labs are normal plan to rule out pelvic mass with pelvic exam +/- pelvis U/S. Advised pt to continue elevation, salt limiting, and to increase physical activity to aid in weight loss. We have discussed that lasix will over short-term symptomatic improvement, but not a cure and with her low/normal BP she is a risk of experiencing symptoms of hypotension. Plan f/u in 2 weeks after labs obtained and reviewed. Pt works at [employer] and requested that she have labs done there bc they will be free. She was provided with a script and instructions to have labs faxed to the clinic.

## 2011-03-07 NOTE — Progress Notes (Signed)
  Subjective:    [name], female    DOB: 01-13-1981, 30 y.o.   MRN: [id number]  HPI B/l LE swelling x 3 weeks, starting in the AM worse by midday. Associated with pain, numbness, tingling. The pain is in her feet. She describes the pain as sharp and feeling of pins and needles. She denies calf pain.  Patient has also gained 26 lbs in 3 weeks. She has cut out salt, decreased sodas (now one can a day). This has never happened before. Also report nausea in the morning.   Patient started walking 2x/ week to help curb weight gain. Denies fevers, chills, rash, SOB, CP.   Review of Systems  Constitutional: Negative.   HENT: Negative.   Respiratory: Negative.   Cardiovascular: Positive for leg swelling. Negative for chest pain and palpitations.  Gastrointestinal: Positive for abdominal distention. Negative for nausea, vomiting, abdominal pain, diarrhea, constipation, blood in stool, anal bleeding and rectal pain.  Musculoskeletal: Negative for joint swelling.  Neurological: Negative.        Objective:   Physical Exam  Vitals reviewed. Constitutional: She appears well-developed and well-nourished.  Cardiovascular: Normal rate, regular rhythm, normal heart sounds and intact distal pulses.   Pulmonary/Chest: Effort normal and breath sounds normal.  Abdominal: Soft.  Extremity: 1+ pitting edema b/l LE.  Neuro: sensation intact b/l feet.         Assessment & Plan:  1. Edema of unknown origin: see problem list for assessment and plan.

## 2011-03-11 ENCOUNTER — Telehealth: Payer: Self-pay | Admitting: Family Medicine

## 2011-03-11 NOTE — Telephone Encounter (Signed)
Pt asking for test results, says she is having more issues.

## 2011-03-14 NOTE — Telephone Encounter (Signed)
Left VM. Pt opted to have labs done at outside lab b/c free to her. I have not received results. Asked pt to call outside lab and request results faxed to clinic for me to review.

## 2011-03-18 ENCOUNTER — Encounter: Payer: Self-pay | Admitting: Family Medicine

## 2011-03-18 ENCOUNTER — Ambulatory Visit (INDEPENDENT_AMBULATORY_CARE_PROVIDER_SITE_OTHER): Payer: 59 | Admitting: Family Medicine

## 2011-03-18 VITALS — BP 105/73 | HR 59 | Wt 226.9 lb

## 2011-03-18 DIAGNOSIS — R600 Localized edema: Secondary | ICD-10-CM

## 2011-03-18 DIAGNOSIS — R609 Edema, unspecified: Secondary | ICD-10-CM

## 2011-03-18 MED ORDER — SPIRONOLACTONE 50 MG PO TABS: 50.0000 mg | ORAL_TABLET | Freq: Every day | ORAL | Status: DC

## 2011-03-18 NOTE — Patient Instructions (Addendum)
Please f/u in one month. To reassess edema.  In the meantime, take the spironolactone, continue to wear the compression stockings, low salt diet, exercise, limit fluids (drink enough water  to keep up with work outs).  Lab work in two weeks- BMET.

## 2011-03-18 NOTE — Progress Notes (Signed)
  Subjective:    [name], female    DOB: 07/01/1981, 30 y.o.   MRN: [id number]  HPI Pt is here to f/u lower extremity edema (ankle and feet bilaterally). She had blood work drawn at an outside lab (where she is employed) after her last visit and would like to review the results. The results have not yet been received.  She states that her edema is improved with spending time in the pool. Nothing else seems to make it better or worse.  Questioned patient about possible sleep apnea- she denies daytime somnolence, snoring    Review of Systems  Constitutional: Positive for fatigue. Negative for fever, chills, diaphoresis, activity change, appetite change and unexpected weight change.  HENT: Negative for facial swelling.   Cardiovascular: Positive for leg swelling. Negative for chest pain and palpitations.       Objective:   Physical Exam  Constitutional: She appears well-developed and well-nourished.  Neck: Normal range of motion. Neck supple.  Cardiovascular: Normal rate, regular rhythm and normal heart sounds.   Pulmonary/Chest: Effort normal and breath sounds normal. No respiratory distress. She has no wheezes. She has no rales. She exhibits no tenderness.  Musculoskeletal: She exhibits edema.       Edema of b/l feet. L>R. Non-pitting.           Assessment & Plan:

## 2011-03-18 NOTE — Assessment & Plan Note (Signed)
Pt with persistent symptoms. Labs were obtained and reviewed- all normal. Assessment: idiopathic edema most likely, pelvic mass possible but less likely. Plan: Trial with Spironolactone. Will need to to pelvic exam at f/u visit. Pt provided with script for repeat BMET to monitor K+. Pt instructed to have blood drawn in 2 weeks.

## 2011-04-23 ENCOUNTER — Ambulatory Visit: Payer: 59 | Admitting: Family Medicine

## 2011-06-03 ENCOUNTER — Telehealth: Payer: Self-pay | Admitting: Family Medicine

## 2011-06-03 ENCOUNTER — Ambulatory Visit (INDEPENDENT_AMBULATORY_CARE_PROVIDER_SITE_OTHER): Payer: 59 | Admitting: Family Medicine

## 2011-06-03 DIAGNOSIS — N760 Acute vaginitis: Secondary | ICD-10-CM

## 2011-06-03 DIAGNOSIS — E669 Obesity, unspecified: Secondary | ICD-10-CM | POA: Insufficient documentation

## 2011-06-03 DIAGNOSIS — R300 Dysuria: Secondary | ICD-10-CM

## 2011-06-03 DIAGNOSIS — N912 Amenorrhea, unspecified: Secondary | ICD-10-CM

## 2011-06-03 DIAGNOSIS — M549 Dorsalgia, unspecified: Secondary | ICD-10-CM

## 2011-06-03 LAB — POCT UA - MICROSCOPIC ONLY

## 2011-06-03 LAB — CBC
HCT: 23.7 — ABNORMAL LOW
HCT: 30.3 — ABNORMAL LOW
Hemoglobin: 10 — ABNORMAL LOW
Hemoglobin: 7.7 — CL
MCHC: 32.5
MCHC: 32.9
MCV: 70.7 — ABNORMAL LOW
MCV: 71.4 — ABNORMAL LOW
Platelets: 254
Platelets: 299
RBC: 3.32 — ABNORMAL LOW
RBC: 4.29
RDW: 17 — ABNORMAL HIGH
RDW: 17.2 — ABNORMAL HIGH
WBC: 15.2 — ABNORMAL HIGH
WBC: 16.5 — ABNORMAL HIGH

## 2011-06-03 LAB — URINE MICROSCOPIC-ADD ON

## 2011-06-03 LAB — POCT WET PREP (WET MOUNT)
Trichomonas Wet Prep HPF POC: NEGATIVE
Yeast Wet Prep HPF POC: NEGATIVE

## 2011-06-03 LAB — URINALYSIS, ROUTINE W REFLEX MICROSCOPIC
Bilirubin Urine: NEGATIVE
Glucose, UA: NEGATIVE
Hgb urine dipstick: NEGATIVE
Ketones, ur: NEGATIVE
Nitrite: NEGATIVE
Protein, ur: NEGATIVE
Specific Gravity, Urine: <1.005 — ABNORMAL LOW
Urobilinogen, UA: 0.2
pH: 7

## 2011-06-03 LAB — POCT URINALYSIS DIPSTICK
Bilirubin, UA: NEGATIVE
Blood, UA: NEGATIVE
Glucose, UA: NEGATIVE
Ketones, UA: NEGATIVE
Nitrite, UA: NEGATIVE
Protein, UA: NEGATIVE
Spec Grav, UA: 1.01
Urobilinogen, UA: 0.2
pH, UA: 7

## 2011-06-03 LAB — RPR: RPR Ser Ql: NONREACTIVE

## 2011-06-03 LAB — POCT URINE PREGNANCY: Preg Test, Ur: NEGATIVE

## 2011-06-03 LAB — CCBB MATERNAL DONOR DRAW

## 2011-06-03 MED ORDER — MELOXICAM 15 MG PO TABS: 15.0000 mg | ORAL_TABLET | Freq: Every day | ORAL | Status: DC

## 2011-06-03 MED ORDER — CYCLOBENZAPRINE HCL 10 MG PO TABS: 10.0000 mg | ORAL_TABLET | Freq: Three times a day (TID) | ORAL | Status: DC | PRN

## 2011-06-03 NOTE — Telephone Encounter (Signed)
Patient needs an appt with you per Dr. Earnest Bailey.  Her phone number is  [phone].

## 2011-06-03 NOTE — Progress Notes (Signed)
  Subjective:    [name], female    DOB: 09-12-1980, 30 y.o.   MRN: [id number]  HPI   Low back pain x 1 day.  No new physical activity or exertion.  Severe in low back bilaterally.  Onset last night after eating a spaghetti dinner.  Has some urgent bowel movements which were normal and she felt better afterwards.  Still with severe back pain.  No constipation, no diarrhea. No emesis.  No fever, felt flushed and felt cold.  No dysuria, vaginal discharge,  urinary frequency or pelvic pain.  + weight gain, is pursuing edema workup with PCP.  Patient reports discontinuing spironolactone.  LMP is late, this month.  Hx of tubal pregnancy on left side? And ligation on right.  Back pain feels like her previous ectopic.  Review of Systems Gen:  No unexplained weight loss, in fact has had weight gain. Ears:  No hearing loss, ringing Eyes: No vision changes, double vision, eye drainage Nose:  No rhinorrhea, congestion Throat:  No sore throat or dysphagia CV:  No chest pain, palpitations, PND, dyspnea on exertion, or edema Resp: No cough, dyspnea, wheezing Abd: No nausea, vomting, diarrhea, constipation, or change in bowel color, size, or caliber. MSK: no joint pain, myalgias SKIN: no rash, changing moles GU: No dysuria, hematuria, vaginal discharge Neuro:  No headache, numbness, weakness, tingling, syncope.      Objective:   Physical Exam GEN: Alert & Oriented, No acute distress CV:  Regular Rate & Rhythm, no murmur Respiratory:  Normal work of breathing, CTAB Abd:  + BS, soft, no tenderness to palpation Ext: minimal pitting pre-tibially up almost to her knees. Pelvic Exam:        External: normal female genitalia without lesions or masses        Vagina: normal without lesions or masses        Cervix: normal without lesions or masses        Adnexa: normal bimanual exam without masses or fullness        Uterus: normal by palpation        Pap smear: performed      Samples for Wet prep, GC/Chlamydia obtained Back:  Tender to bilateral Low lumbar flanks.  No spinal tenderness.  Negative straight leg raise.  States pain is moderate to severe and has trouble laying back on exam table.  Gait normal. Neuro:  LE strength 5/5.       Assessment & Plan:

## 2011-06-03 NOTE — Patient Instructions (Signed)
Will send your urine to culture to see if it grow bacteria, your symptoms are not classic for a urinary tract infection Let me know if you have new symptoms of fever, bloody urine, painful urination, vomting, or other concerns.  Make appointment with Dr. Gerilyn Pilgrim, nutritionist.  Consider bringing a 3 day food diary with you!

## 2011-06-03 NOTE — Assessment & Plan Note (Addendum)
Patient states her PCP told her about nutrition referral and she is now interested even if her insurance does not cover this service.  I gave her info for Dr. Gerilyn Pilgrim and advised 3 day food diary to help get them started,

## 2011-06-03 NOTE — Progress Notes (Signed)
Addended by: [place], Aniya Jolicoeur on: 06/03/2011 04:55 PM   Modules accepted: Orders

## 2011-06-03 NOTE — Assessment & Plan Note (Signed)
Symptoms not consistent for pyelo/cystitis, but does have some WBC in urine.  Will culture and treat as MSK low back pain with NSAIDS and flexeril.  If starts to have fever, urinary symptoms, nausea, will let me know.  Will await urine culture before deciding on antibiotics.  Cervical cultures pending as well.  U preg negative.

## 2011-06-04 LAB — GC/CHLAMYDIA PROBE AMP, GENITAL
Chlamydia, DNA Probe: NEGATIVE
GC Probe Amp, Genital: NEGATIVE

## 2011-06-04 NOTE — Progress Notes (Signed)
Addended by: Macy Mis on: 06/04/2011 11:58 AM   Modules accepted: Orders

## 2011-06-05 LAB — URINE CULTURE
Colony Count: NO GROWTH
Organism ID, Bacteria: NO GROWTH

## 2011-06-17 LAB — URINALYSIS, ROUTINE W REFLEX MICROSCOPIC
Bilirubin Urine: NEGATIVE
Bilirubin Urine: NEGATIVE
Glucose, UA: NEGATIVE
Glucose, UA: NEGATIVE
Hgb urine dipstick: NEGATIVE
Hgb urine dipstick: NEGATIVE
Ketones, ur: NEGATIVE
Ketones, ur: NEGATIVE
Nitrite: NEGATIVE
Nitrite: NEGATIVE
Protein, ur: 30 — AB
Protein, ur: NEGATIVE
Specific Gravity, Urine: 1.02
Specific Gravity, Urine: 1.02
Urobilinogen, UA: 1
Urobilinogen, UA: 2 — ABNORMAL HIGH
pH: 7
pH: 7.5

## 2011-06-17 LAB — URINE MICROSCOPIC-ADD ON

## 2011-06-18 LAB — URINALYSIS, ROUTINE W REFLEX MICROSCOPIC
Bilirubin Urine: NEGATIVE
Glucose, UA: NEGATIVE
Hgb urine dipstick: NEGATIVE
Ketones, ur: NEGATIVE
Nitrite: NEGATIVE
Protein, ur: NEGATIVE
Specific Gravity, Urine: <1.005 — ABNORMAL LOW
Urobilinogen, UA: 0.2
pH: 6.5

## 2011-06-18 LAB — URINE MICROSCOPIC-ADD ON: RBC / HPF: NONE SEEN

## 2011-06-19 LAB — BASIC METABOLIC PANEL
BUN: 6
CO2: 25
Calcium: 9
Chloride: 105
Creatinine, Ser: 0.51
GFR calc Af Amer: >60
GFR calc non Af Amer: >60
Glucose, Bld: 102 — ABNORMAL HIGH
Potassium: 3.8
Sodium: 137

## 2011-06-19 LAB — D-DIMER, QUANTITATIVE (NOT AT ARMC): D-Dimer, Quant: 0.37

## 2011-06-19 LAB — CBC
HCT: 37.7
Hemoglobin: 12.8
MCHC: 34
MCV: 81.8
Platelets: 279
RBC: 4.61
RDW: 14
WBC: 10.3

## 2011-06-19 LAB — PREGNANCY, URINE: Preg Test, Ur: POSITIVE

## 2011-06-19 LAB — URINALYSIS, ROUTINE W REFLEX MICROSCOPIC
Bilirubin Urine: NEGATIVE
Glucose, UA: NEGATIVE
Hgb urine dipstick: NEGATIVE
Ketones, ur: NEGATIVE
Nitrite: NEGATIVE
Protein, ur: NEGATIVE
Specific Gravity, Urine: 1.011
Urobilinogen, UA: 1
pH: 7

## 2011-06-19 LAB — DIFFERENTIAL
Basophils Absolute: 0.1
Basophils Relative: 1
Eosinophils Absolute: 0.2
Eosinophils Relative: 2
Lymphocytes Relative: 19
Lymphs Abs: 2
Monocytes Absolute: 0.7
Monocytes Relative: 7
Neutro Abs: 7.4
Neutrophils Relative %: 72

## 2011-06-19 LAB — URINE MICROSCOPIC-ADD ON

## 2011-06-23 ENCOUNTER — Other Ambulatory Visit: Payer: Self-pay | Admitting: Obstetrics and Gynecology

## 2011-06-23 ENCOUNTER — Encounter: Payer: Self-pay | Admitting: Family Medicine

## 2011-06-23 ENCOUNTER — Ambulatory Visit (INDEPENDENT_AMBULATORY_CARE_PROVIDER_SITE_OTHER): Payer: 59 | Admitting: Family Medicine

## 2011-06-23 VITALS — BP 104/73 | HR 104 | Temp 98.5°F | Ht 62.0 in | Wt 225.0 lb

## 2011-06-23 DIAGNOSIS — R059 Cough, unspecified: Secondary | ICD-10-CM | POA: Insufficient documentation

## 2011-06-23 DIAGNOSIS — M549 Dorsalgia, unspecified: Secondary | ICD-10-CM

## 2011-06-23 DIAGNOSIS — R05 Cough: Secondary | ICD-10-CM

## 2011-06-23 MED ORDER — MELOXICAM 15 MG PO TABS: 15.0000 mg | ORAL_TABLET | Freq: Every day | ORAL | Status: DC

## 2011-06-23 MED ORDER — RIZATRIPTAN BENZOATE 10 MG PO TBDP: 10.0000 mg | ORAL_TABLET | ORAL | Status: DC | PRN

## 2011-06-23 NOTE — Patient Instructions (Addendum)
Return at the end of the week if not starting to improve.  Cough, Viral You have been seen today for your cough. Your caregiver feels that your cough is caused by a virus. Viral infections must run their course and will not respond to antibiotics.  HOME CARE INSTRUCTIONS  Cough suppressants may be used as directed by your caregiver. Keep in mind that coughing helps clear mucus and infection out of the respiratory tract. It is best to only use cough suppressants to allow your child to rest. For children under the age of 4 years, use cough suppressants only as directed by your child's caregiver.   Your caregiver may also prescribe an expectorant to loosen the mucus to be coughed up.   Only take over-the-counter or prescription medicines for pain, discomfort, or fever as directed by your caregiver.   Smoking is the most common cause of bronchitis and coughing. It is a large cause of pneumonia. Stopping this habit is important.   A cold steam vaporizer or humidifier in your room or home may help loosen secretions.   Cough is most often worse at night. Sleeping in a semi-upright position or using a couple of pillows will help with this.   Get rest as you feel it is needed. Your body will help guide you in this manner.  SEEK IMMEDIATE MEDICAL CARE IF:  You develop more pus like (purulent) sputum, have an uncontrolled fever, or become more ill. This is especially true if you are elderly or weakened (debilitated) from any other disease.   You cannot control your cough with suppressants and are losing sleep.   You begin coughing up blood.   Anytime it becomes difficult to breathe (shortness of breath).   You develop pain which is getting worse or is uncontrolled with medications.   You or your child has an oral temperature above 101, not controlled by medicine.   Your baby is older than 3 months with a rectal temperature of 102 F (38.9 C) or higher.   Your baby is 27 months old or younger  with a rectal temperature of 100.4 F (38 C) or higher.   If any of the symptoms that first brought you in for care are getting worse rather than better.  MAKE SURE YOU:    Understand these instructions.   Will watch your condition.   Will get help right away if you are not doing well or get worse.  Document Released: 11/15/2003 Document Re-Released: 02/12/2010

## 2011-06-23 NOTE — Progress Notes (Signed)
  Subjective:    [name], female    DOB: 11-03-1980, 30 y.o.   MRN: [id number]  HPI  2-3 day history of cough.  Patient states her children recently had sinusitis treated with antibiotics.  Patient has no history of chronic lung disease or immune compromise.  Overall fatigued, body aches, fever, headache.   Review of Systems HEENT: Negative for conjunctivitis, ear pain or drainage, rhinorrhea, nasal congestion, sore throat Respiratory:  Negative for sputum Abdomen: Negative for abdominal pain, emesis, diarrhea Skin:  Negative for rash         Objective:   Physical Exam GEN: Alert & Oriented, No acute distress.  No neck stiffness. CV:  Regular Rate & Rhythm, no murmur Respiratory:  Normal work of breathing, CTAB, no wheezes, rales, or decreased air movement Abd:  + BS, soft, no tenderness to palpation Ext: no pre-tibial edema        Assessment & Plan:

## 2011-06-23 NOTE — Assessment & Plan Note (Signed)
Viral uri.  Advised supportive care.  If continues past normal course, or worsens, advised to return for further evaluation.

## 2011-06-24 ENCOUNTER — Telehealth: Payer: Self-pay | Admitting: Family Medicine

## 2011-06-24 NOTE — Telephone Encounter (Signed)
Spoke with Dr. Earnest Bailey and she states she discussed with patient that symptoms may last for a few days. Advised if she is  continuing to feel bad and running fever should make appointment to return tomorrow  be re evaluated.  Patient notified and she states just forget about it and then hung up phone.

## 2011-06-24 NOTE — Telephone Encounter (Signed)
Pt has a fever today of 102.  She was here yesterday and is frustrated that no one can do anything for her.  She has had a fever off and on for 6 days.  Wants to talk to nurse

## 2011-06-24 NOTE — Telephone Encounter (Signed)
Spoke with patient . States temp 102 today , cough, body aches, hurts to breathe, SOB , feels cold and then hot . States she had these same symptoms yesterday and that is why she came in to see doctor. Fever started last Friday 06/20/2011. Will forward message to Dr. Earnest Bailey who saw patient yesterday.

## 2011-07-10 ENCOUNTER — Ambulatory Visit: Payer: 59 | Admitting: Family Medicine

## 2011-07-14 ENCOUNTER — Telehealth: Payer: Self-pay | Admitting: Family Medicine

## 2011-07-14 NOTE — Telephone Encounter (Signed)
Patient called to say she will reschedule medical nutrition therapy (MNT) appt in 2013 - decision related to insurance coverage.

## 2011-07-21 ENCOUNTER — Telehealth: Payer: Self-pay | Admitting: Family Medicine

## 2011-07-21 NOTE — Telephone Encounter (Signed)
Ms. Deanna Thomas need FMLA papers faxed to [phone] asap today!!!  Was to have completed and sent last Friday.  Pt will not get paid for this if not in today.  Pt is employee of [employer].

## 2011-07-21 NOTE — Telephone Encounter (Signed)
Pt called. Form filled out and faxed to pt and employer.

## 2011-10-22 ENCOUNTER — Emergency Department: Payer: Self-pay | Admitting: Emergency Medicine

## 2011-10-22 ENCOUNTER — Telehealth: Payer: Self-pay | Admitting: Family Medicine

## 2011-10-22 ENCOUNTER — Encounter: Payer: Self-pay | Admitting: Family Medicine

## 2011-10-22 ENCOUNTER — Ambulatory Visit (INDEPENDENT_AMBULATORY_CARE_PROVIDER_SITE_OTHER): Payer: 59 | Admitting: Family Medicine

## 2011-10-22 VITALS — BP 110/80 | HR 84 | Temp 98.2°F | Ht 62.0 in | Wt 237.8 lb

## 2011-10-22 DIAGNOSIS — F329 Major depressive disorder, single episode, unspecified: Secondary | ICD-10-CM

## 2011-10-22 LAB — CBC
BUN: 14 mg/dL (ref 4–21)
CO2: 27 mmol/L
Calcium: 8.9 mg/dL
Chloride: 107 mmol/L
Creat: 0.86
Glucose: 85
HCT: 39 %
HCT: 38.5 % (ref 35.0–47.0)
HGB: 13 g/dL (ref 12.0–16.0)
Hemoglobin: 13 g/dL (ref 12.0–16.0)
MCH: 28.6 pg (ref 26.0–34.0)
MCHC: 33.7 g/dL (ref 32.0–36.0)
MCV: 85 fL (ref 80–100)
Platelet: 273 10*3/uL (ref 150–440)
Platelets: 273 10*3/uL
Potassium: 4 mmol/L
RBC: 4.54 10*6/uL (ref 3.80–5.20)
RDW: 13.6 % (ref 11.5–14.5)
Sodium: 139 mmol/L (ref 137–147)
WBC: 8.8
WBC: 8.8 10*3/uL (ref 3.6–11.0)

## 2011-10-22 LAB — BASIC METABOLIC PANEL
Anion Gap: 5 — ABNORMAL LOW (ref 7–16)
BUN: 14 mg/dL (ref 7–18)
Calcium, Total: 8.9 mg/dL (ref 8.5–10.1)
Chloride: 107 mmol/L (ref 98–107)
Co2: 27 mmol/L (ref 21–32)
Creatinine: 0.86 mg/dL (ref 0.60–1.30)
EGFR (African American): >60
EGFR (Non-African Amer.): >60
Glucose: 85 mg/dL (ref 65–99)
Osmolality: 277 (ref 275–301)
Potassium: 4 mmol/L (ref 3.5–5.1)
Sodium: 139 mmol/L (ref 136–145)

## 2011-10-22 LAB — TROPONIN I: Troponin-I: <0.02 ng/mL

## 2011-10-22 MED ORDER — BUPROPION HCL 100 MG PO TABS: 100.0000 mg | ORAL_TABLET | Freq: Two times a day (BID) | ORAL | Status: DC

## 2011-10-22 NOTE — Telephone Encounter (Signed)
Returned call to patient.  Took med around 12:30 pm.  States arms/hands are hurting (L >R), heart racing, feeling hot, and headache.  Informed patient that sxs may be related to starting new med.  Will route note to Dr. Armen Pickup for advice and call patient back.  Gaylene Brooks, RN

## 2011-10-22 NOTE — Telephone Encounter (Signed)
Spoke with Dr. Armen Pickup,   She states that it is an unlikely reaction to the med and if she was at work she wanted her to go home.  LMOVM informing pt..............................Marland KitchenJone Baseman, CMA

## 2011-10-22 NOTE — Telephone Encounter (Signed)
Pain called back. I spoke to her. She states that her heart was beating fast, she has chest pain and nausea. Her boss has called EMS and they are on their way.  Advised pt to get checked. I highly suspect anxiety/panic attack but it is better to be checked with such significant symptoms.

## 2011-10-22 NOTE — Telephone Encounter (Signed)
Pt was given rx for bupropion today and she took it at lunch and now is feeling kind of weird - wants to talk to nurse
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
## 2011-10-22 NOTE — Assessment & Plan Note (Signed)
A: Depression episode x6 weeks. Discussed patient in detail with Dr. Paula Compton. P:  - discussed medication options with the patient. Plan to start bupropion as this medication is generally weight neutral. -Advised patient to start medication tonight as it may be sedating. -Advised patient to follow up with me in 2 weeks. Although Dareen Piano the patient is very busy at this time and we could close followup as far back as 3 weeks. -Advised immediately initiating therapy. -Advised daily exercise, suggesting some free options he like youtubing workout videos.

## 2011-10-22 NOTE — Patient Instructions (Signed)
Deanna Thomas,  Thank you for coming to see me today. Please start bupropion which will help with your depression, related anger and poor sleep. This medication does not cause weight gain.  Please start therapy as soon as possible. I agree that working out will be great, please be careful not to add additional stress with finances in the process.  F/u in 2 weeks.    Bupropion sustained-release tablets (Depression/Mood Disorders) What is this medicine? BUPROPION (byoo PROE pee on) is used to treat depression. This medicine may be used for other purposes; ask your health care provider or pharmacist if you have questions. What should I tell my health care provider before I take this medicine? They need to know if you have any of these conditions: -an eating disorder, such as anorexia or bulimia -bipolar disorder or psychosis -diabetes or high blood sugar, treated with medication -head injury or brain tumor -heart disease, previous heart attack, or irregular heart beat -high blood pressure -kidney or liver disease -seizures -suicidal thoughts or a previous suicide attempt -Tourette's syndrome -weight loss -an unusual or allergic reaction to bupropion, other medicines, foods, dyes, or preservatives -breast-feeding -pregnant or trying to become pregnant How should I use this medicine? Take this medicine by mouth with a glass of water. Follow the directions on the prescription label. You can take it with or without food. If it upsets your stomach, take it with food. Do not cut, crush or chew this medicine. Take your medicine at regular intervals. If you take this medicine more than once a day, take your second dose at least 8 hours after you take your first dose. To limit difficulty in sleeping, avoid taking this medicine at bedtime. Do not take your medicine more often than directed. If you have been taking this medicine for some time, do not suddenly stop taking it. If your doctor wants you to  stop the medicine, the dose will be slowly lowered over time to avoid any side effects. A special MedGuide will be given to you by the pharmacist with each prescription and refill. Be sure to read this information carefully each time. Talk to your pediatrician regarding the use of this medicine in children. Special care may be needed. Overdosage: If you think you have taken too much of this medicine contact a poison control center or emergency room at once. NOTE: This medicine is only for you. Do not share this medicine with others. What if I miss a dose? If you miss a dose, skip the missed dose and take your next tablet at the regular time. There should be at least 8 hours between doses. Do not take double or extra doses. What may interact with this medicine? Do not take this medicine with any of the following medications: -linezolid -medicines called MAO Inhibitors like Nardil, Parnate, Marplan, Eldepryl -methylene blue -other medicines that contain bupropion like Zyban -procarbazine This medicine may also interact with the following medications: -amantadine -carbamazepine -cimetidine -corticosteroids -cyclophosphamide -efavirenz -levodopa or combination drugs containing levodopa -medicines or herbal products for weight control or appetite -medicines for mental depression, emotional, or psychotic disturbances -nelfinavir -nicotine -orphenadrine -phenobarbital -phenytoin -ritonavir -some medicines for heart rhythm or blood pressure -theophylline -thiotepa -tramadol -warfarin This list may not describe all possible interactions. Give your health care provider a list of all the medicines, herbs, non-prescription drugs, or dietary supplements you use. Also tell them if you smoke, drink alcohol, or use illegal drugs. Some items may interact with your medicine. What should I  watch for while using this medicine? Visit your doctor or health care professional for regular checks on your  progress. You may have to take this medicine for several days before you start to feel better. Patients and their families should watch out for depression or thoughts of suicide that get worse. Also watch out for sudden or severe changes in feelings such as feeling anxious, agitated, panicky, irritable, hostile, aggressive, impulsive, severely restless, overly excited and hyperactive, or not being able to sleep. If this happens, especially at the beginning of treatment or after a change in dose, call your doctor. Alcohol may increase dizziness or drowsiness. Avoid alcoholic drinks while taking this medicine. Drinking excessive alcoholic beverages, using sleeping or anxiety medicines, or quickly stopping the use of these agents while taking this medicine may increase your risk for a seizure. You may get dizzy or have blurred vision. Do not drive, use machinery, or do anything that needs mental alertness until you know how this medicine affects you. Do not stand or sit up quickly, especially if you are an older patient. This reduces the risk of dizzy or fainting spells. Your mouth may get dry. Chewing sugarless gum or sucking hard candy, and drinking plenty of water may help. Contact your doctor if the problem does not go away or is severe. Do not treat yourself for coughs, colds, or allergies without asking your doctor or health care professional. Also do not take any herbal or non-prescription medicines for weight loss without the advice of your doctor or health care professional. Some ingredients may increase possible side effects. If you take a urine drug screening test while receiving this medicine, it may make the test result positive for amphetamines. Be sure to tell the person giving you the drug screening test that you are taking this medicine. What side effects may I notice from receiving this medicine? Side effects that you should report to your doctor or health care professional as soon as  possible: -allergic reactions like skin rash, itching or hives, swelling of the face, lips, or tongue -breathing problems -changes in vision -confusion -fast or irregular heartbeat -hallucinations -increased blood pressure -redness, blistering, peeling or loosening of the skin, including inside the mouth -seizures -suicidal thoughts or other mood changes -unusually weak or tired -vomiting Side effects that usually do not require medical attention (report to your doctor or health care professional if they continue or are bothersome): -change in sex drive or performance -constipation -headache -loss of appetite -nausea -tremors -weight loss This list may not describe all possible side effects. Call your doctor for medical advice about side effects. You may report side effects to FDA at 1-800-FDA-1088. Where should I keep my medicine? Keep out of the reach of children. Store at room temperature between 20 and 25 degrees C (68 and 77 degrees F), away from direct sunlight and moisture. Keep tightly closed. Throw away any unused medicine after the expiration date. NOTE: This sheet is a summary. It may not cover all possible information. If you have questions about this medicine, talk to your doctor, pharmacist, or health care provider.  2012, [employer]. (04/15/2010 7:38:11 PM)

## 2011-10-23 ENCOUNTER — Encounter: Payer: Self-pay | Admitting: Family Medicine

## 2011-10-23 ENCOUNTER — Ambulatory Visit (INDEPENDENT_AMBULATORY_CARE_PROVIDER_SITE_OTHER): Payer: 59 | Admitting: Family Medicine

## 2011-10-23 DIAGNOSIS — T887XXA Unspecified adverse effect of drug or medicament, initial encounter: Secondary | ICD-10-CM

## 2011-10-23 DIAGNOSIS — F329 Major depressive disorder, single episode, unspecified: Secondary | ICD-10-CM

## 2011-10-23 DIAGNOSIS — G43909 Migraine, unspecified, not intractable, without status migrainosus: Secondary | ICD-10-CM

## 2011-10-23 DIAGNOSIS — T50905A Adverse effect of unspecified drugs, medicaments and biological substances, initial encounter: Secondary | ICD-10-CM | POA: Insufficient documentation

## 2011-10-23 DIAGNOSIS — F4322 Adjustment disorder with anxiety: Secondary | ICD-10-CM

## 2011-10-23 MED ORDER — PROCHLORPERAZINE 25 MG RE SUPP: 25.0000 mg | Freq: Two times a day (BID) | RECTAL | Status: DC | PRN

## 2011-10-23 MED ORDER — LORAZEPAM 0.5 MG PO TABS: 0.5000 mg | ORAL_TABLET | Freq: Two times a day (BID) | ORAL | Status: AC | PRN

## 2011-10-23 MED ORDER — RIZATRIPTAN BENZOATE 10 MG PO TBDP: 10.0000 mg | ORAL_TABLET | ORAL | Status: DC | PRN

## 2011-10-23 MED ORDER — PROCHLORPERAZINE MALEATE 10 MG PO TABS: 5.0000 mg | ORAL_TABLET | Freq: Four times a day (QID) | ORAL | Status: DC | PRN

## 2011-10-23 MED ORDER — KETOROLAC TROMETHAMINE 30 MG/ML IJ SOLN
30.0000 mg | Freq: Once | INTRAMUSCULAR | Status: AC
  Administered 2011-10-23: 30 mg via INTRAMUSCULAR

## 2011-10-23 MED ORDER — HYDROCODONE-ACETAMINOPHEN 5-500 MG PO TABS: 1.0000 | ORAL_TABLET | Freq: Four times a day (QID) | ORAL | Status: DC | PRN

## 2011-10-23 NOTE — Patient Instructions (Addendum)
Acute Migraine  Take Maxalt today.  If headache does not improve at least 50% two hours after taking the Maxalt then take half tablet of Compazine (Prochlorperazine) with Benadryl 12.5 mg by mouth and one Vicodin tablet (Hydrocodone/acetaminophen).  This will make you sleepy.    Adjustment Disorder with Anxious mood I agree with stopping the Wellbutrin (Bupropion).  It is okay to use the lorazepam up to twice a day if you need it as you deal with the multiple stressors in your life.  This medication is used for short term as you deal with the new problems in your life and family.  Watch for sleepiness from this medication.   Continue with your planned counseling as it will be the most helpful in helping you learn ways of dealing with the new problems in your life and family.

## 2011-10-24 ENCOUNTER — Telehealth: Payer: Self-pay | Admitting: Family Medicine

## 2011-10-24 ENCOUNTER — Encounter: Payer: Self-pay | Admitting: Family Medicine

## 2011-10-24 ENCOUNTER — Encounter (HOSPITAL_COMMUNITY): Payer: Self-pay | Admitting: Emergency Medicine

## 2011-10-24 ENCOUNTER — Emergency Department (HOSPITAL_COMMUNITY)
Admission: EM | Admit: 2011-10-24 | Discharge: 2011-10-24 | Disposition: A | Discharge status: Home / Self Care | Payer: 59 | Source: Home / Self Care | Attending: Emergency Medicine | Admitting: Emergency Medicine

## 2011-10-24 DIAGNOSIS — J020 Streptococcal pharyngitis: Secondary | ICD-10-CM

## 2011-10-24 HISTORY — DX: Depression, unspecified: F32.A

## 2011-10-24 HISTORY — DX: Major depressive disorder, single episode, unspecified: F32.9

## 2011-10-24 LAB — POCT RAPID STREP A: Streptococcus, Group A Screen (Direct): POSITIVE — AB

## 2011-10-24 MED ORDER — PENICILLIN G BENZATHINE 1200000 UNIT/2ML IM SUSP
1.2000 10*6.[IU] | Freq: Once | INTRAMUSCULAR | Status: AC
  Administered 2011-10-24: 1.2 10*6.[IU] via INTRAMUSCULAR

## 2011-10-24 MED ORDER — PENICILLIN G BENZATHINE 1200000 UNIT/2ML IM SUSP
INTRAMUSCULAR | Status: AC
  Filled 2011-10-24: qty 2

## 2011-10-24 NOTE — Assessment & Plan Note (Signed)
Acute sterotypical migraine precipitated by stress of adverse reaction.  Now over 24 hours in duration. At risk of developing status migrainosus. Given Toradol 30 mg IM x 1 in office today. Recommend Maxalt, if no response to two doses, then rescue with migraine cocktail of Compazine/Benadryl and opiate. Warned patient about sedating effects of the Cocktail. Note for work stating out for 2/14 thru 2/16 unless patient feeling improved in which case she can return to work.

## 2011-10-24 NOTE — Discharge Instructions (Signed)
Tylenol or motrin as needed for pain. Return to the ED if you have trouble breathing, if you cannot swallow liquids, if you have a persistent fever >100.4, or any other concerns.   Strep Throat Strep throat is an infection of the throat caused by a bacteria named Streptococcus pyogenes. Your caregiver may call the infection streptococcal "tonsillitis" or "pharyngitis" depending on whether there are signs of inflammation in the tonsils or back of the throat. Strep throat is most common in children from 98 to 32 years old during the cold months of the year, but it can occur in people of any age during any season. This infection is spread from person to person (contagious) through coughing, sneezing, or other close contact. SYMPTOMS   Fever or chills.   Painful, swollen, red tonsils or throat.   Pain or difficulty when swallowing.   White or yellow spots on the tonsils or throat.   Swollen, tender lymph nodes or "glands" of the neck or under the jaw.   Red rash all over the body (rare).  DIAGNOSIS  Many different infections can cause the same symptoms. A test must be done to confirm the diagnosis so the right treatment can be given. A "rapid strep test" can help your caregiver make the diagnosis in a few minutes. If this test is not available, a light swab of the infected area can be used for a throat culture test. If a throat culture test is done, results are usually available in a day or two. TREATMENT  Strep throat is treated with antibiotic medicine. HOME CARE INSTRUCTIONS   Gargle with 1 tsp of salt in 1 cup of warm water, 3 to 4 times per day or as needed for comfort.   Family members who also have a sore throat or fever should be tested for strep throat and treated with antibiotics if they have the strep infection.   Make sure everyone in your household washes their hands well.   Do not share food, drinking cups, or personal items that could cause the infection to spread to others.     You may need to eat a soft food diet until your sore throat gets better.   Drink enough water and fluids to keep your urine clear or pale yellow. This will help prevent dehydration.   Get plenty of rest.   Stay home from school, daycare, or work until you have been on antibiotics for 24 hours.   Only take over-the-counter or prescription medicines for pain, discomfort, or fever as directed by your caregiver.   If antibiotics are prescribed, take them as directed. Finish them even if you start to feel better.  SEEK MEDICAL CARE IF:   The glands in your neck continue to enlarge.   You develop a rash, cough, or earache.   You cough up green, yellow-brown, or bloody sputum.   You have pain or discomfort not controlled by medicines.   Your problems seem to be getting worse rather than better.  SEEK IMMEDIATE MEDICAL CARE IF:   You develop any new symptoms such as vomiting, severe headache, stiff or painful neck, chest pain, shortness of breath, or trouble swallowing.   You develop severe throat pain, drooling, or changes in your voice.   You develop swelling of the neck, or the skin on the neck becomes red and tender.   You have a fever.   You develop signs of dehydration, such as fatigue, dry mouth, and decreased urination.   You become  increasingly sleepy, or you cannot wake up completely.  Document Released: 08/22/2000 Document Revised: 05/07/2011 Document Reviewed: 10/24/2010 [facility] Patient Information 2012 [place], [place].

## 2011-10-24 NOTE — Telephone Encounter (Signed)
Stomach is still cramping and wants to talk to nurse  Woke up this am with sore throat - everyone around her has strep (2 kids, boyfriend, his mom)

## 2011-10-24 NOTE — Assessment & Plan Note (Signed)
RTC in one week to discuss with Dr Armen Pickup treatment for depression.

## 2011-10-24 NOTE — Telephone Encounter (Signed)
Spoke with patient and she states she started with abdominal cramping last night , felt the need to have a BM but couldn't. She sort of felt anxiety attack , but sat in a tub of warm water and then was able to have BM which was more like diarrhea.   She took ativan last night and this AM . She is wondering if this is cause of abdominal cramping. Also has a sore throat with temp of 99.9 AX during the night last night. . Dr. McDiarmid consulted and he discussed symptoms over the phone with patient. Advised that he doesn't think ativan is cause of the cramping and would continue. Also advised to have  Sore throat evaluated.  RN advised patient since we have no available appointment today to go to Urgent Care.

## 2011-10-24 NOTE — Progress Notes (Signed)
  Subjective:    [name], female    DOB: 02-22-1981, 31 y.o.   MRN: [id number]  HPI Onset of tachycardia with increase anxiety within an hour of taking first dose of Buproprion per patient. Associated symptoms of heaviness in both arms, left > right, with chest discomfort, onset of left-sided headache similar to migraine headaches in past. Constellation of symptoms started while at desk at work around 12:30 pm.  [name] to [facility] ([facility] ED for evaluation. Head CT, EKGs, and blood work unremarkable. Patient given Rx for 3 doses of Maxalt tablets.  Patient took all three doses at home over several hours with minimal relief from left-sided headache. (+) Nausea. Headache currently 5 to 6 out of 10.  Patient labeled the event as a "Panic Attack" which see reports never having experienced before.   She denies significant mood difficulties prior to the recent start psychosocial stressors.  Records from [facility] ED requested.  Not currently available for review.   Pt relayed current interrelational stressors (husband and children), as well as financial and legal stressors that are well documented in Dr Armen Pickup OV note from 10/22/11. The most important symptoms that she thinks we can help with are her irritability with her children and her difficulty with sleep. She has little local social support.  Her relation with her husband is antagonistic.    She denies alcohol use or illicit drug use.  She has couseling arranged through the court system starting in early March.  She looks forward to starting counseling.     Review of Systems  HENT: Negative for facial swelling and trouble swallowing.   Respiratory: Negative for cough, choking, chest tightness and shortness of breath.   Cardiovascular: Negative for chest pain.  Neurological: Positive for headaches. Negative for speech difficulty and weakness.       Objective:   Physical Exam  Constitutional: Vital  signs are normal. She is cooperative.  Non-toxic appearance. No distress.       obese  HENT:  Right Ear: Tympanic membrane and ear canal normal.  Left Ear: Tympanic membrane and ear canal normal.  Eyes: Conjunctivae are normal.  Neck: No thyromegaly present.  Cardiovascular: Normal rate, regular rhythm, S1 normal, S2 normal and normal heart sounds.   Pulmonary/Chest: Effort normal and breath sounds normal.  Neurological: She is alert. Gait normal.  Psychiatric: Her behavior is normal. Thought content normal. Her mood appears anxious. Her affect is not inappropriate. Her speech is not rapid and/or pressured, not delayed, not tangential and not slurred. She is not agitated and not slowed. Cognition and memory are normal. Cognition and memory are not impaired. She exhibits normal recent memory.          Assessment & Plan:

## 2011-10-24 NOTE — Assessment & Plan Note (Addendum)
Given adverse reaction to Bupropion likely due to the Norepinephrine reuptake inhibitor effect, recommended that patient stop Buproprion.  While patient appears to have both depressive and anxious symptoms, the predominant complaint today seems to be ones.  Given predominance of symptoms are worry about psychosocial stressors that are impacting her familial role and vegetative symptom of sleep, it seems reasonable to treat for Adjustment disorder with anxious mood.   She will soon be starting counseling in early March which will be the best intervention to assist with her adjustment to her current psychosocial stressors.  Will give a time-limited course of benzodiazipine, lorazepam, 0.5 mg twice a day, as needed to help with the somatic feelings and emotive irritability that is impairing her relationships.  Patient understands that this is a one time intervention, and that engagement in counseling is the most effective intervention for adjustment disorder.  Patient given educational material on Adjustment Disorder.

## 2011-10-24 NOTE — ED Notes (Signed)
Boyfriend , both of her children tested pos for strep; pt post nasopharynx slightly reddened; NAD

## 2011-10-24 NOTE — ED Provider Notes (Signed)
History     CSN: [id number]  Arrival date & time 10/24/11  1142   First MD Initiated Contact with Patient 10/24/11 1203      Chief Complaint  Patient presents with  . Sore Throat    (Consider location/radiation/quality/duration/timing/severity/associated sxs/prior treatment) HPI Comments: Patient with sore throat for several days. Reports fever 99.9 axillary. No rhinorrhea, postnasal drip, nasal congestion, headache, voice changes, trismus, nausea, vomiting, coughing, abdominal pain, rash. No reflux symptoms. Patient states her boyfriend and her children have tested positive for strep in the past 24 hours.  ROS as noted in HPI. All other ROS negative.   Patient is a 31 y.o. female presenting with pharyngitis. The history is provided by the patient. No language interpreter was used.  Sore Throat This is a new problem. The current episode started more than 2 days ago. The problem occurs constantly. The problem has not changed since onset.Pertinent negatives include no chest pain, no abdominal pain, no headaches and no shortness of breath. The symptoms are aggravated by swallowing. The symptoms are relieved by nothing. She has tried nothing for the symptoms. The treatment provided no relief.    Past Medical History  Diagnosis Date  . Migraine   . Asthma   . Depression   . Ectopic pregnancy     Past Surgical History  Procedure Date  . C-sections   . Abdominal surgery     for ectopic pregnancy    History reviewed. No pertinent family history.  History  Substance Use Topics  . Smoking status: Never Smoker   . Smokeless tobacco: Not on file  . Alcohol Use: No    OB History    Grav Para Term Preterm Abortions TAB SAB Ect Mult Living                  Review of Systems  Respiratory: Negative for shortness of breath.   Cardiovascular: Negative for chest pain.  Gastrointestinal: Negative for abdominal pain.  Neurological: Negative for headaches.    Allergies    Bupropion and Imitrex  Home Medications   Current Outpatient Rx  Name Route Sig Dispense Refill  . ALBUTEROL 90 MCG/ACT IN AERS Inhalation Inhale 2 puffs into the lungs every 4 (four) hours as needed.      Marland Kitchen HYDROCODONE-ACETAMINOPHEN 5-500 MG PO TABS Oral Take 1 tablet by mouth every 6 (six) hours as needed for pain. For migraine headache that fails to respond to Maxalt. 12 tablet 0  . LORAZEPAM 0.5 MG PO TABS Oral Take 1 tablet (0.5 mg total) by mouth 2 (two) times daily as needed for anxiety. 28 tablet 0  . MULTIVITAMINS PO CAPS Oral Take 1 capsule by mouth daily.      Marland Kitchen PROCHLORPERAZINE MALEATE 10 MG PO TABS Oral Take 0.5 tablets (5 mg total) by mouth every 6 (six) hours as needed. Nausea or migraine headache. Will cause drowsiness. 10 tablet 0  . RIZATRIPTAN BENZOATE 10 MG PO TBDP Oral Take 1 tablet (10 mg total) by mouth as needed for migraine. May repeat in 2 hours if needed 9 tablet PRN    BP 97/53  Pulse 98  Temp(Src) 98.6 F (37 C) (Oral)  Resp 16  SpO2 99%  LMP 10/18/2011  Physical Exam  Nursing note and vitals reviewed. Constitutional: She is oriented to person, place, and time. She appears well-developed and well-nourished.  HENT:  Head: Normocephalic and atraumatic. No trismus in the jaw.  Right Ear: Tympanic membrane and ear canal normal.  Left Ear: Tympanic membrane and ear canal normal.  Nose: Nose normal.  Mouth/Throat: Uvula is midline and mucous membranes are normal. No oropharyngeal exudate or tonsillar abscesses.       Bilateral erythematous, enlarged tonsils with exudates.  Eyes: Conjunctivae and EOM are normal.  Neck: Normal range of motion. Neck supple.  Cardiovascular: Normal rate, regular rhythm and normal heart sounds.   Pulmonary/Chest: Effort normal and breath sounds normal.  Abdominal: She exhibits no distension.  Musculoskeletal: Normal range of motion.  Lymphadenopathy:    She has cervical adenopathy.  Neurological: She is alert and oriented  to person, place, and time.  Skin: Skin is warm and dry. No rash noted.  Psychiatric: She has a normal mood and affect. Her behavior is normal. Judgment and thought content normal.    ED Course  Procedures (including critical care time)  Labs Reviewed  POCT RAPID STREP A ([facility] CARE ONLY) - Abnormal; Notable for the following:    Streptococcus, Group A Screen (Direct) POSITIVE (*)    All other components within normal limits   No results found.   1. Strep pharyngitis       MDM  Patient states she cannot take pills. Giving penicillin G 1.2 million units IM times one.  Luiz Blare, MD 10/24/11 [phone]

## 2011-10-30 ENCOUNTER — Emergency Department (HOSPITAL_COMMUNITY)
Admission: EM | Admit: 2011-10-30 | Discharge: 2011-10-30 | Disposition: A | Discharge status: Home Health | Payer: 59 | Attending: Emergency Medicine | Admitting: Emergency Medicine

## 2011-10-30 ENCOUNTER — Encounter (HOSPITAL_COMMUNITY): Payer: Self-pay | Admitting: *Deleted

## 2011-10-30 ENCOUNTER — Encounter: Payer: Self-pay | Admitting: *Deleted

## 2011-10-30 ENCOUNTER — Emergency Department (HOSPITAL_COMMUNITY): Payer: 59

## 2011-10-30 DIAGNOSIS — R059 Cough, unspecified: Secondary | ICD-10-CM

## 2011-10-30 DIAGNOSIS — F3289 Other specified depressive episodes: Secondary | ICD-10-CM | POA: Insufficient documentation

## 2011-10-30 DIAGNOSIS — R509 Fever, unspecified: Secondary | ICD-10-CM | POA: Insufficient documentation

## 2011-10-30 DIAGNOSIS — Z79899 Other long term (current) drug therapy: Secondary | ICD-10-CM | POA: Insufficient documentation

## 2011-10-30 DIAGNOSIS — J45909 Unspecified asthma, uncomplicated: Secondary | ICD-10-CM | POA: Insufficient documentation

## 2011-10-30 DIAGNOSIS — G43909 Migraine, unspecified, not intractable, without status migrainosus: Secondary | ICD-10-CM | POA: Insufficient documentation

## 2011-10-30 DIAGNOSIS — J3489 Other specified disorders of nose and nasal sinuses: Secondary | ICD-10-CM | POA: Insufficient documentation

## 2011-10-30 DIAGNOSIS — R05 Cough: Secondary | ICD-10-CM

## 2011-10-30 DIAGNOSIS — R6889 Other general symptoms and signs: Secondary | ICD-10-CM | POA: Insufficient documentation

## 2011-10-30 DIAGNOSIS — R070 Pain in throat: Secondary | ICD-10-CM | POA: Insufficient documentation

## 2011-10-30 DIAGNOSIS — F329 Major depressive disorder, single episode, unspecified: Secondary | ICD-10-CM | POA: Insufficient documentation

## 2011-10-30 MED ORDER — PREDNISONE 50 MG PO TABS: ORAL_TABLET | ORAL | Status: DC

## 2011-10-30 MED ORDER — KETOROLAC TROMETHAMINE 30 MG/ML IJ SOLN
60.0000 mg | Freq: Once | INTRAMUSCULAR | Status: AC
  Administered 2011-10-30: 60 mg via INTRAMUSCULAR
  Filled 2011-10-30: qty 2

## 2011-10-30 MED ORDER — PREDNISONE 20 MG PO TABS
60.0000 mg | ORAL_TABLET | Freq: Once | ORAL | Status: AC
  Administered 2011-10-30: 60 mg via ORAL
  Filled 2011-10-30: qty 3

## 2011-10-30 MED ORDER — DIPHENHYDRAMINE HCL 12.5 MG/5ML PO ELIX
25.0000 mg | ORAL_SOLUTION | Freq: Once | ORAL | Status: AC
  Administered 2011-10-30: 25 mg via ORAL
  Filled 2011-10-30: qty 10

## 2011-10-30 MED ORDER — PROMETHAZINE HCL 25 MG/ML IJ SOLN
25.0000 mg | INTRAMUSCULAR | Status: AC
  Administered 2011-10-30: 25 mg via INTRAMUSCULAR
  Filled 2011-10-30: qty 1

## 2011-10-30 NOTE — ED Provider Notes (Signed)
Medical screening examination/treatment/procedure(s) were performed by non-physician practitioner and as supervising physician I was immediately available for consultation/collaboration.  Hurman Horn, MD 10/30/11 [id number]

## 2011-10-30 NOTE — ED Notes (Signed)
Pt reports several day hx of cold like symptoms. Reports chills fever and cough. Pt reports she was treated for strep recently, states her throat feels better. Pt states she feels like she is dehydrated but is able to keep fluids down. Pt reports hx of migraines and states she has one now and her medications aren't working.

## 2011-10-30 NOTE — ED Provider Notes (Signed)
History     CSN: [id number]  Arrival date & time 10/30/11  6962   First MD Initiated Contact with Patient 10/30/11 [phone]      Chief Complaint  Patient presents with  . Headache  . Nasal Congestion    (Consider location/radiation/quality/duration/timing/severity/associated sxs/prior treatment) HPI  Patient presents to emergency department complaining of a 5 to 6 day history of cough, runny nose, sinus pressure, headache, and sore throat stating that she was seen at an urgent care on February 15 and diagnosed with strep and given a shot of penicillin as treatment and since then states her sore throat has improved however she is having ongoing cough and headache as well as sinus pressure. Patient states she has a history of migraines and since the onset of the cold  symptoms has had increased migraines without improvement with her usual Maxalt. Patient states she had a temperature of 101 last night and has underlying asthma and has been increasing her use of her inhaler for her cough and wheezing. She denies dizziness, visual changes, neck stiffness, rash, chest pain, hemoptysis, abdominal pain, nausea, vomiting, diarrhea, dysuria, hematuria. She denies aggravating or alleviating factors. Symptoms are gradual onset, unchanging, and persistent.  Past Medical History  Diagnosis Date  . Migraine   . Asthma   . Depression   . Ectopic pregnancy     Past Surgical History  Procedure Date  . C-sections   . Abdominal surgery     for ectopic pregnancy    History reviewed. No pertinent family history.  History  Substance Use Topics  . Smoking status: Never Smoker   . Smokeless tobacco: Not on file  . Alcohol Use: No    OB History    Grav Para Term Preterm Abortions TAB SAB Ect Mult Living                  Review of Systems  All other systems reviewed and are negative.    Allergies  Bupropion and Imitrex  Home Medications   Current Outpatient Rx  Name Route Sig Dispense  Refill  . ALBUTEROL 90 MCG/ACT IN AERS Inhalation Inhale 2 puffs into the lungs every 4 (four) hours as needed. For wheeze or shortness of breath    . TORADOL IM IM Intramuscular Inject into the muscle.    Marland Kitchen LORAZEPAM 0.5 MG PO TABS Oral Take 1 tablet (0.5 mg total) by mouth 2 (two) times daily as needed for anxiety. 28 tablet 0  . MULTIVITAMINS PO CAPS Oral Take 1 capsule by mouth daily.      Marland Kitchen PENICILLIN G BENZATHINE 600000 UNIT/ML IM SUSP Intramuscular Inject 60,000 Units into the muscle once.    Marland Kitchen RIZATRIPTAN BENZOATE 10 MG PO TBDP Oral Take 1 tablet (10 mg total) by mouth as needed for migraine. May repeat in 2 hours if needed 9 tablet PRN    BP 131/78  Pulse 86  Temp(Src) 97.7 F (36.5 C) (Oral)  Resp 17  SpO2 96%  LMP 10/18/2011  Physical Exam  Nursing note and vitals reviewed. Constitutional: She is oriented to person, place, and time. She appears well-developed and well-nourished. No distress.  HENT:  Head: Normocephalic and atraumatic.  Right Ear: External ear normal.  Left Ear: External ear normal.  Nose: Nose normal.  Mouth/Throat: No oropharyngeal exudate.       Mild erythema of posterior pharynx and tonsils no tonsillar exudate or enlargement. Patent airway. Swallowing secretions well  Eyes: Conjunctivae and EOM are normal. Pupils are  equal, round, and reactive to light.  Neck: Normal range of motion. Neck supple. No Brudzinski's sign and no Kernig's sign noted.  Cardiovascular: Normal rate, regular rhythm, normal heart sounds and intact distal pulses.  Exam reveals no gallop and no friction rub.   No murmur heard. Pulmonary/Chest: Effort normal. No respiratory distress. She has wheezes. She has no rales. She exhibits no tenderness.       Faint expiratory wheezing  Abdominal: Soft. Bowel sounds are normal. She exhibits no distension and no mass. There is no tenderness. There is no rebound and no guarding.  Musculoskeletal: Normal range of motion. She exhibits no edema  and no tenderness.  Lymphadenopathy:    She has no cervical adenopathy.  Neurological: She is alert and oriented to person, place, and time. She has normal reflexes.  Skin: Skin is warm and dry. No rash noted. She is not diaphoretic. No erythema.  Psychiatric: She has a normal mood and affect.    ED Course  Procedures (including critical care time)  IM toradol and phenergan, PO benadryl and prednisone  Labs Reviewed - No data to display No results found.   1. Migraine   2. Cough       MDM  Afebrile, non toxic appearing with no meningeal signs and no neuro focal findings. Pulse ox 96% on room air with faint exp wheezing but no acute findings on chest xray. Recently tx with PCN for strep with patent airway with no signs of infection. Ambulating without difficulty without ataxia.         Lenon Oms [place], [place] 10/30/11 [phone]

## 2011-10-30 NOTE — Discharge Instructions (Signed)
Take prednisone as directed for cough and continue your at home medications for migraine. Follow up with [facility] in the next few days for recheck of ongoing symptoms but return to ER for emergent changing or worsening of symptoms.  Headaches, Frequently Asked Questions MIGRAINE HEADACHES Q: What is migraine? What causes it? How can I treat it? A: Generally, migraine headaches begin as a dull ache. Then they develop into a constant, throbbing, and pulsating pain. You may experience pain at the temples. You may experience pain at the front or back of one or both sides of the head. The pain is usually accompanied by a combination of:  Nausea.   Vomiting.   Sensitivity to light and noise.  Some people (about 15%) experience an aura (see below) before an attack. The cause of migraine is believed to be chemical reactions in the brain. Treatment for migraine may include over-the-counter or prescription medications. It may also include self-help techniques. These include relaxation training and biofeedback.  Q: What is an aura? A: About 15% of people with migraine get an "aura". This is a sign of neurological symptoms that occur before a migraine headache. You may see wavy or jagged lines, dots, or flashing lights. You might experience tunnel vision or blind spots in one or both eyes. The aura can include visual or auditory hallucinations (something imagined). It may include disruptions in smell (such as strange odors), taste or touch. Other symptoms include:  Numbness.   A "pins and needles" sensation.   Difficulty in recalling or speaking the correct word.  These neurological events may last as long as 60 minutes. These symptoms will fade as the headache begins. Q: What is a trigger? A: Certain physical or environmental factors can lead to or "trigger" a migraine. These include:  Foods.   Hormonal changes.   Weather.   Stress.  It is important to remember that triggers are  different for everyone. To help prevent migraine attacks, you need to figure out which triggers affect you. Keep a headache diary. This is a good way to track triggers. The diary will help you talk to your healthcare professional about your condition. Q: Does weather affect migraines? A: Bright sunshine, hot, humid conditions, and drastic changes in barometric pressure may lead to, or "trigger," a migraine attack in some people. But studies have shown that weather does not act as a trigger for everyone with migraines. Q: What is the link between migraine and hormones? A: Hormones start and regulate many of your body's functions. Hormones keep your body in balance within a constantly changing environment. The levels of hormones in your body are unbalanced at times. Examples are during menstruation, pregnancy, or menopause. That can lead to a migraine attack. In fact, about three quarters of all women with migraine report that their attacks are related to the menstrual cycle.  Q: Is there an increased risk of stroke for migraine sufferers? A: The likelihood of a migraine attack causing a stroke is very remote. That is not to say that migraine sufferers cannot have a stroke associated with their migraines. In persons under age 84, the most common associated factor for stroke is migraine headache. But over the course of a person's normal life span, the occurrence of migraine headache may actually be associated with a reduced risk of dying from cerebrovascular disease due to stroke.  Q: What are acute medications for migraine? A: Acute medications are used to treat the pain of the headache after it  has started. Examples over-the-counter medications, NSAIDs, ergots, and triptans.  Q: What are the triptans? A: Triptans are the newest class of abortive medications. They are specifically targeted to treat migraine. Triptans are vasoconstrictors. They moderate some chemical reactions in the brain. The triptans work  on receptors in your brain. Triptans help to restore the balance of a neurotransmitter called serotonin. Fluctuations in levels of serotonin are thought to be a main cause of migraine.  Q: Are over-the-counter medications for migraine effective? A: Over-the-counter, or "OTC," medications may be effective in relieving mild to moderate pain and associated symptoms of migraine. But you should see your caregiver before beginning any treatment regimen for migraine.  Q: What are preventive medications for migraine? A: Preventive medications for migraine are sometimes referred to as "prophylactic" treatments. They are used to reduce the frequency, severity, and length of migraine attacks. Examples of preventive medications include antiepileptic medications, antidepressants, beta-blockers, calcium channel blockers, and NSAIDs (nonsteroidal anti-inflammatory drugs). Q: Why are anticonvulsants used to treat migraine? A: During the past few years, there has been an increased interest in antiepileptic drugs for the prevention of migraine. They are sometimes referred to as "anticonvulsants". Both epilepsy and migraine may be caused by similar reactions in the brain.  Q: Why are antidepressants used to treat migraine? A: Antidepressants are typically used to treat people with depression. They may reduce migraine frequency by regulating chemical levels, such as serotonin, in the brain.  Q: What alternative therapies are used to treat migraine? A: The term "alternative therapies" is often used to describe treatments considered outside the scope of conventional Western medicine. Examples of alternative therapy include acupuncture, acupressure, and yoga. Another common alternative treatment is herbal therapy. Some herbs are believed to relieve headache pain. Always discuss alternative therapies with your caregiver before proceeding. Some herbal products contain arsenic and other toxins. TENSION HEADACHES Q: What is a  tension-type headache? What causes it? How can I treat it? A: Tension-type headaches occur randomly. They are often the result of temporary stress, anxiety, fatigue, or anger. Symptoms include soreness in your temples, a tightening band-like sensation around your head (a "vice-like" ache). Symptoms can also include a pulling feeling, pressure sensations, and contracting head and neck muscles. The headache begins in your forehead, temples, or the back of your head and neck. Treatment for tension-type headache may include over-the-counter or prescription medications. Treatment may also include self-help techniques such as relaxation training and biofeedback. CLUSTER HEADACHES Q: What is a cluster headache? What causes it? How can I treat it? A: Cluster headache gets its name because the attacks come in groups. The pain arrives with little, if any, warning. It is usually on one side of the head. A tearing or bloodshot eye and a runny nose on the same side of the headache may also accompany the pain. Cluster headaches are believed to be caused by chemical reactions in the brain. They have been described as the most severe and intense of any headache type. Treatment for cluster headache includes prescription medication and oxygen. SINUS HEADACHES Q: What is a sinus headache? What causes it? How can I treat it? A: When a cavity in the bones of the face and skull (a sinus) becomes inflamed, the inflammation will cause localized pain. This condition is usually the result of an allergic reaction, a tumor, or an infection. If your headache is caused by a sinus blockage, such as an infection, you will probably have a fever. An x-ray will confirm a sinus  blockage. Your caregiver's treatment might include antibiotics for the infection, as well as antihistamines or decongestants.  REBOUND HEADACHES Q: What is a rebound headache? What causes it? How can I treat it? A: A pattern of taking acute headache medications too  often can lead to a condition known as "rebound headache." A pattern of taking too much headache medication includes taking it more than 2 days per week or in excessive amounts. That means more than the label or a caregiver advises. With rebound headaches, your medications not only stop relieving pain, they actually begin to cause headaches. Doctors treat rebound headache by tapering the medication that is being overused. Sometimes your caregiver will gradually substitute a different type of treatment or medication. Stopping may be a challenge. Regularly overusing a medication increases the potential for serious side effects. Consult a caregiver if you regularly use headache medications more than 2 days per week or more than the label advises. ADDITIONAL QUESTIONS AND ANSWERS Q: What is biofeedback? A: Biofeedback is a self-help treatment. Biofeedback uses special equipment to monitor your body's involuntary physical responses. Biofeedback monitors:  Breathing.   Pulse.   Heart rate.   Temperature.   Muscle tension.   Brain activity.  Biofeedback helps you refine and perfect your relaxation exercises. You learn to control the physical responses that are related to stress. Once the technique has been mastered, you do not need the equipment any more. Q: Are headaches hereditary? A: Four out of five (80%) of people that suffer report a family history of migraine. Scientists are not sure if this is genetic or a family predisposition. Despite the uncertainty, a child has a 50% chance of having migraine if one parent suffers. The child has a 75% chance if both parents suffer.  Q: Can children get headaches? A: By the time they reach high school, most young people have experienced some type of headache. Many safe and effective approaches or medications can prevent a headache from occurring or stop it after it has begun.  Q: What type of doctor should I see to diagnose and treat my headache? A: Start  with your primary caregiver. Discuss his or her experience and approach to headaches. Discuss methods of classification, diagnosis, and treatment. Your caregiver may decide to recommend you to a headache specialist, depending upon your symptoms or other physical conditions. Having diabetes, allergies, etc., may require a more comprehensive and inclusive approach to your headache. The National Headache Foundation will provide, upon request, a list of [facility] physician members in your state. Document Released: 11/15/2003 Document Revised: 05/07/2011 Document Reviewed: 04/24/2008 [facility] Patient Information 2012 [place], [place].

## 2011-11-03 ENCOUNTER — Other Ambulatory Visit: Payer: Self-pay | Admitting: Family Medicine

## 2011-11-03 ENCOUNTER — Ambulatory Visit: Payer: 59 | Admitting: Family Medicine

## 2011-11-03 NOTE — Telephone Encounter (Signed)
Patient has appt on 3/7 but does not have enough Lorazepam to last until then unless Dr. McDiarmid wants her to stop until her appt.  She was given a total of 28 and takes 2 per day.  She needs a refill sen to [facility] at [address].  Please call her when this has been done.

## 2011-11-03 NOTE — Telephone Encounter (Signed)
Patient is to follow up with Dr Armen Pickup on 11/13/11.  The lorazepam was just a short-term one time treatment.   She will need to discuss further treatment with Dr Armen Pickup.

## 2011-11-03 NOTE — Telephone Encounter (Signed)
Pt agreeable. Deanna Thomas, Deanna Thomas

## 2011-11-05 ENCOUNTER — Telehealth: Payer: Self-pay | Admitting: Family Medicine
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
## 2011-11-06 ENCOUNTER — Telehealth: Payer: Self-pay | Admitting: *Deleted

## 2011-11-06 ENCOUNTER — Encounter: Payer: Self-pay | Admitting: Family Medicine

## 2011-11-06 ENCOUNTER — Ambulatory Visit (INDEPENDENT_AMBULATORY_CARE_PROVIDER_SITE_OTHER): Payer: 59 | Admitting: Family Medicine

## 2011-11-06 VITALS — BP 110/70 | HR 84 | Temp 98.3°F | Ht 62.0 in | Wt 234.9 lb

## 2011-11-06 DIAGNOSIS — F4322 Adjustment disorder with anxiety: Secondary | ICD-10-CM

## 2011-11-06 DIAGNOSIS — R059 Cough, unspecified: Secondary | ICD-10-CM

## 2011-11-06 DIAGNOSIS — F329 Major depressive disorder, single episode, unspecified: Secondary | ICD-10-CM

## 2011-11-06 DIAGNOSIS — R05 Cough: Secondary | ICD-10-CM

## 2011-11-06 DIAGNOSIS — R058 Other specified cough: Secondary | ICD-10-CM | POA: Insufficient documentation

## 2011-11-06 LAB — BASIC METABOLIC PANEL: Troponin I: 0.02 ng/mL (ref ?–0.05)

## 2011-11-06 MED ORDER — AMITRIPTYLINE HCL 25 MG PO TABS: 25.0000 mg | ORAL_TABLET | Freq: Every day | ORAL | Status: DC

## 2011-11-06 NOTE — Assessment & Plan Note (Signed)
Robitussin prn.

## 2011-11-06 NOTE — Patient Instructions (Signed)
Deanna Thomas, Thank you for coming to see me today. Please start the Elavil tonight is 25 mg which she will take for the first five days.   Titration schedule 0-5: 1 tab 6-10: 2 tabs 11-15: 3  Tabs 16-20: 4 tabs 21-25: 5 tabs  26-30: 6 tabs   Goal 150-300 mg per day.   Take robitussin for the cough.  F/u with me in 4 weeks.   Dr. Armen Pickup

## 2011-11-06 NOTE — Telephone Encounter (Signed)
Pt informed.  Pt will see Fun[facility] this afternoon and appt with McDiarmid cancelled.Deanna Thomas, Deanna Thomas

## 2011-11-06 NOTE — Assessment & Plan Note (Signed)
A: stable. No recurrent panic attacks. P: -start elavil for depression (taper up per AVS) -fill out FL2 -f/u in one month. -no refill on ativan.  -pt to start therapy.

## 2011-11-06 NOTE — Assessment & Plan Note (Signed)
A: stable. No recurrent panic attacks. P: -start elavil for depression (taper up per AVS) -fill out FL2 -f/u in one month. -no refill on ativan.  -pt to start therapy.  

## 2011-11-06 NOTE — Progress Notes (Signed)
Subjective:     [name], female   DOB: 10-14-1980, 31 y.o.   MRN: [id number]  HPI A 31 year old female presents to follow up her situational depression and adjustment disorder with anxious mood. She taking any Ativan with good results. She has one left. She reports no anxiety attacks since the first one on 10/22/2011. She does state that she fell while coming on was able to give Ativan and did not have one. She reports sleeping from 10 PM to 6 AM. She reports continued polyphagia but is now choosing lower calorie foods. She denies suicidal ideation. She also denies homicidal ideation. She said the paperwork to start therapy services were provided through her employer.  Strep pharyngitis patient was treated with IV penicillin G. She still has persistent cough and congestion she is not taking anything currently. She denies fever.   Review of Systems Pertinent ROS as per HPI     Objective:   Physical Exam BP 110/70  Pulse 84  Temp(Src) 98.3 F (36.8 C) (Oral)  Ht 5\' 2"  (1.575 m)  Wt 234 lb 14.4 oz (106.55 kg)  BMI 42.96 kg/m2  LMP 10/18/2011 General appearance: alert, cooperative, no distress and a little teary  Lungs: normal work of breathing.     Assessment:         Plan:

## 2011-11-06 NOTE — Telephone Encounter (Signed)
Received call from [facility].  Patient has Rx for Elavil 25mg  [facility] and states Dr. Armen Pickup told her to take med differently.  Pharmacy wants to verify dosage.  Informed pharmacist of titration schedule listed in  Dr. Armen Pickup office notes for today. Gaylene Brooks, RN

## 2011-11-06 NOTE — Telephone Encounter (Signed)
Ms Deanna Thomas will need to be evaluated at [facility] as the Ativan prescription was to be a one time treatment.    She was to be scheduled with Dr Armen Pickup for a one-week follow-up appointment after her SDA with Loana Salvaggio on 2/14, but was incorrectly scheduled with Chaya Dehaan instead for 11/13/11.   Can we get the patient a sooner appointment with her PCP, Dr Armen Pickup,  to discuss longer term treatment of her condition?

## 2011-11-13 ENCOUNTER — Ambulatory Visit: Payer: 59 | Admitting: Family Medicine

## 2011-11-18 ENCOUNTER — Emergency Department (HOSPITAL_COMMUNITY): Payer: 59

## 2011-11-18 ENCOUNTER — Encounter (HOSPITAL_COMMUNITY): Payer: Self-pay | Admitting: Emergency Medicine

## 2011-11-18 ENCOUNTER — Emergency Department (HOSPITAL_COMMUNITY)
Admission: EM | Admit: 2011-11-18 | Discharge: 2011-11-19 | Disposition: A | Discharge status: Home / Self Care | Payer: 59 | Attending: Emergency Medicine | Admitting: Emergency Medicine

## 2011-11-18 DIAGNOSIS — M25476 Effusion, unspecified foot: Secondary | ICD-10-CM | POA: Insufficient documentation

## 2011-11-18 DIAGNOSIS — F3289 Other specified depressive episodes: Secondary | ICD-10-CM | POA: Insufficient documentation

## 2011-11-18 DIAGNOSIS — S99919A Unspecified injury of unspecified ankle, initial encounter: Secondary | ICD-10-CM | POA: Insufficient documentation

## 2011-11-18 DIAGNOSIS — F329 Major depressive disorder, single episode, unspecified: Secondary | ICD-10-CM | POA: Insufficient documentation

## 2011-11-18 DIAGNOSIS — M7989 Other specified soft tissue disorders: Secondary | ICD-10-CM | POA: Insufficient documentation

## 2011-11-18 DIAGNOSIS — M79609 Pain in unspecified limb: Secondary | ICD-10-CM | POA: Insufficient documentation

## 2011-11-18 DIAGNOSIS — S93409A Sprain of unspecified ligament of unspecified ankle, initial encounter: Secondary | ICD-10-CM | POA: Insufficient documentation

## 2011-11-18 DIAGNOSIS — T148XXA Other injury of unspecified body region, initial encounter: Secondary | ICD-10-CM

## 2011-11-18 DIAGNOSIS — R209 Unspecified disturbances of skin sensation: Secondary | ICD-10-CM | POA: Insufficient documentation

## 2011-11-18 DIAGNOSIS — S9030XA Contusion of unspecified foot, initial encounter: Secondary | ICD-10-CM | POA: Insufficient documentation

## 2011-11-18 DIAGNOSIS — J45909 Unspecified asthma, uncomplicated: Secondary | ICD-10-CM | POA: Insufficient documentation

## 2011-11-18 DIAGNOSIS — R609 Edema, unspecified: Secondary | ICD-10-CM | POA: Insufficient documentation

## 2011-11-18 DIAGNOSIS — M25473 Effusion, unspecified ankle: Secondary | ICD-10-CM | POA: Insufficient documentation

## 2011-11-18 DIAGNOSIS — M25579 Pain in unspecified ankle and joints of unspecified foot: Secondary | ICD-10-CM | POA: Insufficient documentation

## 2011-11-18 DIAGNOSIS — Z79899 Other long term (current) drug therapy: Secondary | ICD-10-CM | POA: Insufficient documentation

## 2011-11-18 DIAGNOSIS — S8990XA Unspecified injury of unspecified lower leg, initial encounter: Secondary | ICD-10-CM | POA: Insufficient documentation

## 2011-11-18 MED ORDER — HYDROCODONE-ACETAMINOPHEN 5-325 MG PO TABS: 1.0000 | ORAL_TABLET | ORAL | Status: AC | PRN
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
## 2011-11-18 NOTE — Discharge Instructions (Signed)
You were seen and evaluated today for your left ankle and lower leg injury. At this time your x-rays do not show any signs for fracture or break in the bones. Your providers today feel your swelling is caused from sprain or injury to the ligaments and muscle. You have been given a splint to where to help with healing. We have also been provided crutches to keep weight off her foot for the next week. Please followup with orthopedic specialist.   Ankle Sprain An ankle sprain is an injury to the strong, fibrous tissues (ligaments) that hold the bones of your ankle joint together.  CAUSES Ankle sprain usually is caused by a fall or by twisting your ankle. People who participate in sports are more prone to these types of injuries.  SYMPTOMS  Symptoms of ankle sprain include:  Pain in your ankle. The pain may be present at rest or only when you are trying to stand or walk.   Swelling.   Bruising. Bruising may develop immediately or within 1 to 2 days after your injury.   Difficulty standing or walking.  DIAGNOSIS  Your caregiver will ask you details about your injury and perform a physical exam of your ankle to determine if you have an ankle sprain. During the physical exam, your caregiver will press and squeeze specific areas of your foot and ankle. Your caregiver will try to move your ankle in certain ways. An X-ray exam may be done to be sure a bone was not broken or a ligament did not separate from one of the bones in your ankle (avulsion).  TREATMENT  Certain types of braces can help stabilize your ankle. Your caregiver can make a recommendation for this. Your caregiver may recommend the use of medication for pain. If your sprain is severe, your caregiver may refer you to a surgeon who helps to restore function to parts of your skeletal system (orthopedist) or a physical therapist. HOME CARE INSTRUCTIONS  Apply ice to your injury for 1 to 2 days or as directed by your caregiver. Applying ice  helps to reduce inflammation and pain.  Put ice in a plastic bag.   Place a towel between your skin and the bag.   Leave the ice on for 15 to 20 minutes at a time, every 2 hours while you are awake.   Take over-the-counter or prescription medicines for pain, discomfort, or fever only as directed by your caregiver.   Keep your injured leg elevated, when possible, to lessen swelling.   If your caregiver recommends crutches, use them as instructed. Gradually, put weight on the affected ankle. Continue to use crutches or a cane until you can walk without feeling pain in your ankle.   If you have a plaster splint, wear the splint as directed by your caregiver. Do not rest it on anything harder than a pillow the first 24 hours. Do not put weight on it. Do not get it wet. You may take it off to take a shower or bath.   You may have been given an elastic bandage to wear around your ankle to provide support. If the elastic bandage is too tight (you have numbness or tingling in your foot or your foot becomes cold and blue), adjust the bandage to make it comfortable.   If you have an air splint, you may blow more air into it or let air out to make it more comfortable. You may take your splint off at night and before taking  a shower or bath.   Wiggle your toes in the splint several times per day if you are able.  SEEK MEDICAL CARE IF:   You have an increase in bruising, swelling, or pain.   Your toes feel cold.   Pain relief is not achieved with medication.  SEEK IMMEDIATE MEDICAL CARE IF: Your toes are numb or blue or you have severe pain. MAKE SURE YOU:   Understand these instructions.   Will watch your condition.   Will get help right away if you are not doing well or get worse.  Document Released: 08/25/2005 Document Revised: 08/14/2011 Document Reviewed: 03/29/2008 [facility] Patient Information 2012 [place], [place].    Contusion A contusion is a deep bruise. Contusions are the  result of an injury that caused bleeding under the skin. The contusion may turn blue, purple, or yellow. Minor injuries will give you a painless contusion, but more severe contusions may stay painful and swollen for a few weeks.  CAUSES  A contusion is usually caused by a blow, trauma, or direct force to an area of the body. SYMPTOMS   Swelling and redness of the injured area.   Bruising of the injured area.   Tenderness and soreness of the injured area.   Pain.  DIAGNOSIS  The diagnosis can be made by taking a history and physical exam. An X-ray, CT scan, or MRI may be needed to determine if there were any associated injuries, such as fractures. TREATMENT  Specific treatment will depend on what area of the body was injured. In general, the best treatment for a contusion is resting, icing, elevating, and applying cold compresses to the injured area. Over-the-counter medicines may also be recommended for pain control. Ask your caregiver what the best treatment is for your contusion. HOME CARE INSTRUCTIONS   Put ice on the injured area.   Put ice in a plastic bag.   Place a towel between your skin and the bag.   Leave the ice on for 15 to 20 minutes, 3 to 4 times a day.   Only take over-the-counter or prescription medicines for pain, discomfort, or fever as directed by your caregiver. Your caregiver may recommend avoiding anti-inflammatory medicines (aspirin, ibuprofen, and naproxen) for 48 hours because these medicines may increase bruising.   Rest the injured area.   If possible, elevate the injured area to reduce swelling.  SEEK IMMEDIATE MEDICAL CARE IF:   You have increased bruising or swelling.   You have pain that is getting worse.   Your swelling or pain is not relieved with medicines.  MAKE SURE YOU:   Understand these instructions.   Will watch your condition.   Will get help right away if you are not doing well or get worse.  Document Released: 06/04/2005  Document Revised: 08/14/2011 Document Reviewed: 06/30/2011 [facility] Patient Information 2012 [place], [place].
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
## 2011-11-19 NOTE — ED Notes (Signed)
Ortho tech called 

## 2011-11-19 NOTE — ED Provider Notes (Signed)
Medical screening examination/treatment/procedure(s) were performed by non-physician practitioner and as supervising physician I was immediately available for consultation/collaboration.  Jezreel Justiniano, MD 11/19/11 0735 

## 2011-12-03 ENCOUNTER — Ambulatory Visit: Payer: 59 | Admitting: Family Medicine

## 2011-12-12 ENCOUNTER — Encounter: Payer: Self-pay | Admitting: Family Medicine

## 2011-12-12 ENCOUNTER — Ambulatory Visit (INDEPENDENT_AMBULATORY_CARE_PROVIDER_SITE_OTHER): Payer: 59 | Admitting: Family Medicine

## 2011-12-12 VITALS — BP 107/74 | HR 92 | Temp 98.5°F | Ht 62.0 in | Wt 237.0 lb

## 2011-12-12 DIAGNOSIS — F4322 Adjustment disorder with anxiety: Secondary | ICD-10-CM

## 2011-12-12 DIAGNOSIS — F329 Major depressive disorder, single episode, unspecified: Secondary | ICD-10-CM

## 2011-12-12 MED ORDER — AMITRIPTYLINE HCL 100 MG PO TABS: 100.0000 mg | ORAL_TABLET | Freq: Every day | ORAL | Status: DC

## 2011-12-12 MED ORDER — LORAZEPAM 0.5 MG PO TABS: 0.5000 mg | ORAL_TABLET | Freq: Two times a day (BID) | ORAL | Status: AC | PRN

## 2011-12-12 MED ORDER — CITALOPRAM HYDROBROMIDE 40 MG PO TABS: 20.0000 mg | ORAL_TABLET | Freq: Every day | ORAL | Status: DC

## 2011-12-12 NOTE — Patient Instructions (Addendum)
Earsie,  Thank you for coming in to see me today. For your mood and sleep: we will continue elavil 100 mg nightly. We will start celexa 20 mg daily x 8 days then 40 mg daily for daytime symptoms of anxiety. For the first 8 days of taking the celexa take ativan as well. After this take ativan as needed for stressful days (court).   We will allow up to 30 ativan tabs per month with a goal of using it only as needed for the shortest amount of time necessary.    In addition,  continue to build coping skills through therapy, exercise and doing fun activities with your children.   Please f/u in 4-6 weeks Dr. Armen Pickup   Serotonin Syndrome Serotonin is a brain chemical that regulates the nervous system. Some kinds of drugs increase the amount of serotonin in your body. Drugs that increase the serotonin in your body include:    Anti-depressant medications.   St. John's wort.   Recreational drugs.   Migraine medicines.   Some pain medicines.  SYMPTOMS Combining these drugs increases the risk that you will become ill with a toxic condition called serotonin syndrome.   Symptoms of too much serotonin include:  Confusion.   Agitation.   Weakness.   Insomnia.   Fever.   Sweats.  Other symptoms that may develop include:  Shakiness.   Muscle spasms.   Seizures.  TREATMENT  Hospital treatment is often needed until the effects are controlled.   Avoiding the combination of medicines listed above is recommended.   Check with your doctor if you are concerned about your medicine or the side effects.  Document Released: 10/02/2004 Document Revised: 08/14/2011 Document Reviewed: 08/25/2005 [facility] Patient Information 2012 [place], [place].

## 2011-12-18 NOTE — Progress Notes (Signed)
Subjective:     [name], female   DOB: November 04, 1980, 31 y.o.   MRN: [id number]  HPI Patient returns to f/u depression/adjustment disorder with anxious mood. She it taking elavil. She has taking it for 5 weeks.  She is now up to 150 gm q D. She states the overall he anxiety had improved with just one anxiety attack during court. She reports continued difficulty with falling asleep at night. She also reports great difficulty waking up in the morning. She reports that her increased appetite is the sam but she is making better food choices. She is participating in therapy to working on coping. She is enjoying fun times with her children and working out at [employer]. She denies homicidal or suicidal ideations. She denies hallucinations. She sleeps 9 hrs per night.   Review of Systems As per HPI     Objective:   Physical Exam BP 107/74  Pulse 92  Temp(Src) 98.5 F (36.9 C) (Oral)  Ht 5\' 2"  (1.575 m)  Wt 237 lb (107.502 kg)  BMI 43.35 kg/m2  LMP 11/14/2011 General appearance: alert, cooperative, appears older than stated age and well groomed.  Neurologic: Grossly normal    Assessment and Plan:  A: Depression/adjustment disorder with anxious mood. Improving with less anxiety. P:  -reduced elavil to 100 mg q D  -Add celexa 40 mg q D for long term anxiety management -ativan 0.5 mg q D prn particularly stressful events ( court dates) -continue with therapy to gain coping skills -F/u in one month.

## 2011-12-19 NOTE — Assessment & Plan Note (Signed)
A: Depression/adjustment disorder with anxious mood. Improving with less anxiety. P:  -reduced elavil to 100 mg q D  -Add celexa 40 mg q D for long term anxiety management -ativan 0.5 mg q D prn particularly stressful events ( court dates) -continue with therapy to gain coping skills -F/u in one month.   

## 2011-12-19 NOTE — Assessment & Plan Note (Signed)
A: Depression/adjustment disorder with anxious mood. Improving with less anxiety. P:  -reduced elavil to 100 mg q D  -Add celexa 40 mg q D for long term anxiety management -ativan 0.5 mg q D prn particularly stressful events ( court dates) -continue with therapy to gain coping skills -F/u in one month.

## 2012-01-02 ENCOUNTER — Telehealth: Payer: Self-pay | Admitting: Family Medicine

## 2012-01-02 DIAGNOSIS — F329 Major depressive disorder, single episode, unspecified: Secondary | ICD-10-CM

## 2012-01-02 NOTE — Telephone Encounter (Signed)
Patient would like to lower the dosage of her Citalopram and Amitriptyline.  She has been taking 20 mg instead of 40 mc of the Citalopram and 75 mg  Instead of 100mg  of the Amitriptyline.  The Rx for the Amitriptyline is for 100 mg tabs and she needs it to be change to 25mg  tabs so she can take 3.  She uses [facility] at [address].

## 2012-01-03 MED ORDER — AMITRIPTYLINE HCL 75 MG PO TABS: 75.0000 mg | ORAL_TABLET | Freq: Every day | ORAL | Status: DC

## 2012-01-03 MED ORDER — CITALOPRAM HYDROBROMIDE 20 MG PO TABS: 20.0000 mg | ORAL_TABLET | Freq: Every day | ORAL | Status: DC

## 2012-01-03 NOTE — Telephone Encounter (Signed)
Changes made and new scripts sent. Please inform patient.

## 2012-01-21 ENCOUNTER — Ambulatory Visit (INDEPENDENT_AMBULATORY_CARE_PROVIDER_SITE_OTHER): Payer: 59 | Admitting: Family Medicine

## 2012-01-21 DIAGNOSIS — F329 Major depressive disorder, single episode, unspecified: Secondary | ICD-10-CM

## 2012-01-21 NOTE — Progress Notes (Signed)
[name], female   DOB: Jul 01, 1981, 31 y.o.   MRN: [id number] Patient [facility]

## 2013-04-20 ENCOUNTER — Encounter: Payer: Self-pay | Admitting: Family Medicine

## 2013-04-20 ENCOUNTER — Ambulatory Visit (INDEPENDENT_AMBULATORY_CARE_PROVIDER_SITE_OTHER): Payer: 59 | Admitting: Family Medicine

## 2013-04-20 VITALS — BP 115/81 | HR 90 | Temp 98.2°F | Wt 250.0 lb

## 2013-04-20 DIAGNOSIS — R5383 Other fatigue: Secondary | ICD-10-CM

## 2013-04-20 DIAGNOSIS — R5381 Other malaise: Secondary | ICD-10-CM

## 2013-04-20 NOTE — Patient Instructions (Addendum)
I am sorry you are not feeling well, and I'm sorry I dont know exactly what is going on. But lets start the work up today, I will call you with any abnormal results.  Please follow up in one week, or sooner if you feel worse.  Amber M. Hairford, M.D.
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
## 2013-04-20 NOTE — Assessment & Plan Note (Signed)
Patient with unspecified fatigue and other symptoms. No clear reason. Will check CBC for anemia, Bmet for electrolytes and glucose as well as TSH. She states she is not under increased stress at this time, but stress/anxiety can also present with these types of symptoms. Will call with abnormal results, and pt to follow up in one week.

## 2013-04-22 ENCOUNTER — Ambulatory Visit: Payer: 59 | Admitting: Family Medicine

## 2013-05-16 ENCOUNTER — Encounter: Payer: Self-pay | Admitting: Family Medicine

## 2013-05-16 ENCOUNTER — Ambulatory Visit (INDEPENDENT_AMBULATORY_CARE_PROVIDER_SITE_OTHER): Payer: 59 | Admitting: Family Medicine

## 2013-05-16 ENCOUNTER — Other Ambulatory Visit: Payer: Self-pay

## 2013-05-16 VITALS — BP 111/74 | HR 77 | Temp 98.2°F | Wt 248.0 lb

## 2013-05-16 DIAGNOSIS — F329 Major depressive disorder, single episode, unspecified: Secondary | ICD-10-CM

## 2013-05-16 DIAGNOSIS — F4322 Adjustment disorder with anxiety: Secondary | ICD-10-CM

## 2013-05-16 MED ORDER — CITALOPRAM HYDROBROMIDE 20 MG PO TABS: 20.0000 mg | ORAL_TABLET | Freq: Every day | ORAL | Status: DC

## 2013-05-16 NOTE — Patient Instructions (Addendum)
Deanna Thomas it was great to meet you today! I am sorry that you are not feeling well.  I think optimizing your sleep at night to at least 8 hrs may help you feel better during the day. Minimizing lights, noises will help as well as making sure you are not watching tv, calling or texting from your bed at night.  I would also recommend you restart the medication that you tried before, celexa, to see if that may help with these feelings. I do not think that you will have increased thoughts of depression or suicidality but should these appear stop this medication right away and give [place] a call  I will see you back in 2 weeks to see how you are doing.   Charlane Ferretti, MD

## 2013-05-16 NOTE — Progress Notes (Signed)
[name], female   DOB: 26-Sep-1980, 32 y.o.   MRN: [id number] [facility] Charlane Ferretti, MD Phone: [phone]  Subjective:   # generalized fatigue -began approx 3 weeks ago. At that time had difficulty getting out out bed. Feeling like she is falling asleep at work. Although attests to getting 6-8 hrs a sleep per night. Still c/o pin prick pains diffusely as well as breast tenderness  -has hx of migraines as well -PHQ-0 score of 10  All systems were reviewed and were negative unless otherwise noted in the HPI  Past Medical History Patient Active Problem List   Diagnosis Date Noted  . Other malaise and fatigue 04/20/2013  . Cough productive of clear sputum 11/06/2011  . Adverse drug reaction 10/23/2011  . Adjustment disorder with anxious mood 10/23/2011  . Depression, major, single episode 10/22/2011  . Back pain 06/03/2011  . Obesity 06/03/2011  . BENIGN PAROXYSMAL POSITIONAL VERTIGO 09/22/2008  . CARPAL TUNNEL SYNDROME, BILATERAL 07/17/2008  . UNSPECIFIED SUBJECTIVE VISUAL DISTURBANCE 04/19/2008  . INTERMITTENT VERTIGO 04/19/2008  . ANEMIA, IRON DEFICIENCY 03/02/2008  . MIGRAINE, UNSPEC., W/O INTRACTABLE MIGRAINE 11/05/2006  . ASTHMA, INTERMITTENT 11/05/2006   Reviewed problem list.  Medications- reviewed and updated Chief complaint-noted No additions to family history Social history- patient is a non smoker  Objective: LMP 03/28/2013 Gen: NAD, alert, cooperative with exam Neck: FROM, supple no thyromegaly CV: RRR, good S1/S2, no murmur, cap refill <3 Breast- no fixed firm masses, nipple not expressing discharge or blood Resp: CTABL, no wheezes, non-labored Abd: SNTND, BS present, no guarding or organomegaly Neuro: Alert and oriented, No gross deficits Skin: no rashes no lesions  Assessment/Plan: See problem based a/p
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
## 2013-06-06 ENCOUNTER — Ambulatory Visit (INDEPENDENT_AMBULATORY_CARE_PROVIDER_SITE_OTHER): Payer: 59 | Admitting: Family Medicine

## 2013-06-06 ENCOUNTER — Encounter: Payer: Self-pay | Admitting: Family Medicine

## 2013-06-06 VITALS — BP 104/69 | HR 70 | Temp 98.3°F | Ht 62.0 in | Wt 245.0 lb

## 2013-06-06 DIAGNOSIS — F329 Major depressive disorder, single episode, unspecified: Secondary | ICD-10-CM

## 2013-06-06 NOTE — Patient Instructions (Addendum)
It was good to see you today. Keep on the same dose, and follow up with Dr. Michail Jewels in 2-3 weeks.  You can take Tylenol or advil for your headaches.   Please get your flu shot when it is available.  Deanna Thomas, M.D.  Sleep Apnea  Sleep apnea is a sleep disorder characterized by abnormal pauses in breathing while you sleep. When your breathing pauses, the level of oxygen in your blood decreases. This causes you to move out of deep sleep and into light sleep. As a result, your quality of sleep is poor, and the system that carries your blood throughout your body (cardiovascular system) experiences stress. If sleep apnea remains untreated, the following conditions can develop:  High blood pressure (hypertension).  Coronary artery disease.  Inability to achieve or maintain an erection (impotence).  Impairment of your thought process (cognitive dysfunction). There are three types of sleep apnea: 1. Obstructive sleep apnea Pauses in breathing during sleep because of a blocked airway. 2. Central sleep apnea Pauses in breathing during sleep because the area of the brain that controls your breathing does not send the correct signals to the muscles that control breathing. 3. Mixed sleep apnea A combination of both obstructive and central sleep apnea. RISK FACTORS The following risk factors can increase your risk of developing sleep apnea:  Being overweight.  Smoking.  Having narrow passages in your nose and throat.  Being of older age.  Being female.  Alcohol use.  Sedative and tranquilizer use.  Ethnicity. Among individuals younger than 35 years, African Americans are at increased risk of sleep apnea. SYMPTOMS   Difficulty staying asleep.  Daytime sleepiness and fatigue.  Loss of energy.  Irritability.  Loud, heavy snoring.  Morning headaches.  Trouble concentrating.  Forgetfulness.  Decreased interest in sex. DIAGNOSIS  In order to diagnose sleep apnea, your  caregiver will perform a physical examination. Your caregiver may suggest that you take a home sleep test. Your caregiver may also recommend that you spend the night in a sleep lab. In the sleep lab, several monitors record information about your heart, lungs, and brain while you sleep. Your leg and arm movements and blood oxygen level are also recorded. TREATMENT The following actions may help to resolve mild sleep apnea:  Sleeping on your side.   Using a decongestant if you have nasal congestion.   Avoiding the use of depressants, including alcohol, sedatives, and narcotics.   Losing weight and modifying your diet if you are overweight. There also are devices and treatments to help open your airway:  Oral appliances. These are custom-made mouthpieces that shift your lower jaw forward and slightly open your bite. This opens your airway.  Devices that create positive airway pressure. This positive pressure "splints" your airway open to help you breathe better during sleep. The following devices create positive airway pressure:  Continuous positive airway pressure (CPAP) device. The CPAP device creates a continuous level of air pressure with an air pump. The air is delivered to your airway through a mask while you sleep. This continuous pressure keeps your airway open.  Nasal expiratory positive airway pressure (EPAP) device. The EPAP device creates positive air pressure as you exhale. The device consists of single-use valves, which are inserted into each nostril and held in place by adhesive. The valves create very little resistance when you inhale but create much more resistance when you exhale. That increased resistance creates the positive airway pressure. This positive pressure while you exhale keeps  your airway open, making it easier to breath when you inhale again.  Bilevel positive airway pressure (BPAP) device. The BPAP device is used mainly in patients with central sleep apnea. This  device is similar to the CPAP device because it also uses an air pump to deliver continuous air pressure through a mask. However, with the BPAP machine, the pressure is set at two different levels. The pressure when you exhale is lower than the pressure when you inhale.  Surgery. Typically, surgery is only done if you cannot comply with less invasive treatments or if the less invasive treatments do not improve your condition. Surgery involves removing excess tissue in your airway to create a wider passage way. Document Released: 08/15/2002 Document Revised: 02/24/2012 Document Reviewed: 01/01/2012 [facility] Patient Information 2014 [place], [place].

## 2013-06-06 NOTE — Assessment & Plan Note (Addendum)
Started on Celexa and appears stable at this time. No SI/HI. Only on week 2 of medication so encouraged her to keep on same dose and see how she feels. F/u in 2-3 weeks with Dr. Michail Jewels. Hopefully her symptoms will continue to improve. Only consideration I have mentioned to her is also the possibility of sleep apnea. Although she denies snoring, she is obese, has decreased sleep latency, morning headaches and daytime sleepiness. Could consider sleep study as well, and I have given her some information to review. Patient agrees with plan and will make appointment with Dr. Michail Jewels.

## 2013-06-06 NOTE — Progress Notes (Signed)
[name], female   DOB: 17-Feb-1981, 32 y.o.   MRN: [id number]  [facility] Phoua Hoadley M. Hikeem Andersson, MD Phone: [phone]   Subjective: HPI: Patient is a 32 y.o. female presenting to clinic today for depression follow up.  Started on Celexa 20mg  a few weeks ago. She states she still has increased daytime sleepiness, headaches and hot flashes. Overall she states she is not as achy, but the above symptoms are worse.   States she sleeps 6 hours per night. She is going to bed earlier and is in bed for 8 hours. (Takes Celexa between 8-9:30 then in bed at 10.) Does not feel restless at night. Does not snore. Wakes up with headache in the morning.   History Reviewed: Non-smoker.  ROS: Please see HPI above.  Objective: Office vital signs reviewed. BP 104/69  Pulse 70  Temp(Src) 98.3 F (36.8 C) (Oral)  Ht 5\' 2"  (1.575 m)  Wt 245 lb (111.131 kg)  BMI 44.8 kg/m2  Physical Examination:  General: Awake, alert. NAD. Happy and smiling. HEENT: Atraumatic, normocephalic. MMM. Neck: No masses palpated. No LAD Pulm: CTAB, no wheezes Cardio: RRR, no murmurs appreciated Abdomen:+BS, soft, nontender, nondistended Extremities: No edema Neuro: Grossly intact  Assessment: 32 y.o. female with depression follow up  Plan: See Problem List and After Visit Summary

## 2013-06-27 ENCOUNTER — Encounter: Payer: Self-pay | Admitting: Family Medicine

## 2013-06-27 ENCOUNTER — Ambulatory Visit (INDEPENDENT_AMBULATORY_CARE_PROVIDER_SITE_OTHER): Payer: 59 | Admitting: Family Medicine

## 2013-06-27 VITALS — BP 116/64 | Temp 98.8°F | Wt 246.0 lb

## 2013-06-27 DIAGNOSIS — G43909 Migraine, unspecified, not intractable, without status migrainosus: Secondary | ICD-10-CM

## 2013-06-27 MED ORDER — KETOROLAC TROMETHAMINE 60 MG/2ML IM SOLN
60.0000 mg | Freq: Once | INTRAMUSCULAR | Status: AC
  Administered 2013-06-27: 60 mg via INTRAMUSCULAR

## 2013-06-27 NOTE — Progress Notes (Signed)
  Subjective:    [name], female    DOB: 03-21-1981, 32 y.o.   MRN: [id number]  HPI  HEADACHE   Onset: 3 days ago  Location: all over Quality: aching Precipitating factors: not sure maybe allergies Prior treatment: Took maxalt 2 tabs yesterday only helped a little.  She thinks Celexa makes it worse  Associated Symptoms Nausea/vomiting: yes  Photophobia/phonophobia: yes  Tearing of eyes: no  Sinus pain/pressure: no   Red Flags Fever: no  Neck pain/stiffness: no  Vision/speech/swallow/hearing difficulty: no  Focal weakness/numbness: no  Altered mental status: no  Trauma: no  New type of headache: no  Anticoagulant use: no   Review of Systems     Objective:   Physical Exam Alert no acute distress Neurologic exam : Cn 2-7 intact Neck:  No deformities, thyromegaly, masses, or tenderness noted.   Supple with full range of motion without pain. Eye - Pupils Equal Round Reactive to light, Extraocular movements intact, Fundi without hemorrhage or visible lesions, Conjunctiva without redness or discharge         Assessment & Plan:

## 2013-06-27 NOTE — Assessment & Plan Note (Addendum)
Recurrent.  Symptoms consistent with migraine no signs of infection or cranial lesions.  Will treat with ketoralac which she has had before.  She feels celexa is giving her more headache so will wean off.  Asked to come back and see PCP bring all medications and see what symptoms are bothering her the most.  To consider a different ssri if mostly depressive.  If insomnia and needs headache prophylaxis - tricyclic or tetracyclic antidepressant might work

## 2013-06-27 NOTE — Patient Instructions (Signed)
Good to see you today!  Thanks for coming in.  We will give you a shot your headache should be gone in 1-2 days  If you headache is not gone in 2 days or you develop any weakness or trouble seeing then come back  Take 1/2 celexa tablet for one week then stop  Bring in all your medications next visit  See Dr Michail Jewels in 2 weeks

## 2013-07-09 DIAGNOSIS — IMO0002 Reserved for concepts with insufficient information to code with codable children: Secondary | ICD-10-CM

## 2013-07-09 HISTORY — DX: Reserved for concepts with insufficient information to code with codable children: IMO0002

## 2013-07-14 ENCOUNTER — Other Ambulatory Visit: Payer: Self-pay

## 2013-07-15 ENCOUNTER — Encounter: Payer: Self-pay | Admitting: Family Medicine

## 2013-07-15 ENCOUNTER — Ambulatory Visit (INDEPENDENT_AMBULATORY_CARE_PROVIDER_SITE_OTHER): Payer: 59 | Admitting: Family Medicine

## 2013-07-15 VITALS — BP 117/78 | HR 86 | Temp 97.9°F | Wt 253.0 lb

## 2013-07-15 DIAGNOSIS — J45909 Unspecified asthma, uncomplicated: Secondary | ICD-10-CM

## 2013-07-15 DIAGNOSIS — G4730 Sleep apnea, unspecified: Secondary | ICD-10-CM

## 2013-07-15 DIAGNOSIS — G43909 Migraine, unspecified, not intractable, without status migrainosus: Secondary | ICD-10-CM

## 2013-07-15 DIAGNOSIS — F4322 Adjustment disorder with anxiety: Secondary | ICD-10-CM

## 2013-07-15 DIAGNOSIS — G4700 Insomnia, unspecified: Secondary | ICD-10-CM

## 2013-07-15 MED ORDER — RIZATRIPTAN BENZOATE 10 MG PO TBDP: 10.0000 mg | ORAL_TABLET | ORAL | Status: DC | PRN

## 2013-07-15 MED ORDER — ALBUTEROL SULFATE HFA 108 (90 BASE) MCG/ACT IN AERS: 2.0000 | INHALATION_SPRAY | Freq: Four times a day (QID) | RESPIRATORY_TRACT | Status: DC | PRN

## 2013-07-15 MED ORDER — PROPRANOLOL HCL 40 MG PO TABS: 40.0000 mg | ORAL_TABLET | Freq: Two times a day (BID) | ORAL | Status: DC

## 2013-07-15 NOTE — Assessment & Plan Note (Signed)
A: celexa stopped by Dr. Deirdre Priest bc he felt that they were contributing to headaches, does not attest to depressive sx at this time, feels more anxious with intermittent panic attacks than depressed  P: propanolol 40mg  BID, f/up in 3 months for sx reassessment, keep appointments with counselor

## 2013-07-15 NOTE — Patient Instructions (Signed)
Ms Deanna Thomas, Thanks so much for coming in today! I am happy to hear that you are doing better. I would recommend getting the sleep study done to determine if sleep apnea is contributing to your headaches I would also recommend starting propranolol which can help with both your anxiety as well as your migraines I will see you back in 3 months or sooner if things are not getting better  Charlane Ferretti, MD

## 2013-07-15 NOTE — Assessment & Plan Note (Signed)
A: 3 episodes in the last month lasting 3-4 days, relieved by maxalt P: re prescribe maxalt, start propranolol 40mg  BID, monitor for bradycardia, limit NSAID use
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
## 2013-07-15 NOTE — Assessment & Plan Note (Signed)
A: initial insomnia, worsening with stress, daytime sleepiness; fm with extensive hx of sleep apnea P: sleep study for sleep apnea eval

## 2013-07-21 ENCOUNTER — Other Ambulatory Visit: Payer: Self-pay | Admitting: Orthopedic Surgery

## 2013-07-26 ENCOUNTER — Encounter (HOSPITAL_BASED_OUTPATIENT_CLINIC_OR_DEPARTMENT_OTHER): Payer: Self-pay | Admitting: *Deleted

## 2013-07-29 ENCOUNTER — Encounter (HOSPITAL_BASED_OUTPATIENT_CLINIC_OR_DEPARTMENT_OTHER): Payer: Self-pay | Admitting: *Deleted

## 2013-08-01 ENCOUNTER — Ambulatory Visit (HOSPITAL_BASED_OUTPATIENT_CLINIC_OR_DEPARTMENT_OTHER): Payer: 59 | Admitting: Anesthesiology

## 2013-08-01 ENCOUNTER — Encounter (HOSPITAL_BASED_OUTPATIENT_CLINIC_OR_DEPARTMENT_OTHER): Payer: 59 | Admitting: Anesthesiology

## 2013-08-01 ENCOUNTER — Encounter (HOSPITAL_BASED_OUTPATIENT_CLINIC_OR_DEPARTMENT_OTHER): Payer: Self-pay | Admitting: *Deleted

## 2013-08-01 ENCOUNTER — Ambulatory Visit (HOSPITAL_BASED_OUTPATIENT_CLINIC_OR_DEPARTMENT_OTHER)
Admission: RE | Admit: 2013-08-01 | Discharge: 2013-08-01 | Disposition: A | Discharge status: Home / Self Care | Payer: 59 | Source: Ambulatory Visit | Attending: Orthopedic Surgery | Admitting: Orthopedic Surgery

## 2013-08-01 ENCOUNTER — Encounter (HOSPITAL_BASED_OUTPATIENT_CLINIC_OR_DEPARTMENT_OTHER)
Admission: RE | Disposition: A | Discharge status: Home / Self Care | Payer: Self-pay | Source: Ambulatory Visit | Attending: Orthopedic Surgery

## 2013-08-01 DIAGNOSIS — G43909 Migraine, unspecified, not intractable, without status migrainosus: Secondary | ICD-10-CM | POA: Insufficient documentation

## 2013-08-01 DIAGNOSIS — M6740 Ganglion, unspecified site: Secondary | ICD-10-CM | POA: Insufficient documentation

## 2013-08-01 DIAGNOSIS — J45909 Unspecified asthma, uncomplicated: Secondary | ICD-10-CM | POA: Insufficient documentation

## 2013-08-01 HISTORY — DX: Dysphagia, unspecified: R13.10

## 2013-08-01 HISTORY — DX: Other complications of anesthesia, initial encounter: T88.59XA

## 2013-08-01 HISTORY — DX: Other specified health status: Z78.9

## 2013-08-01 HISTORY — DX: Adverse effect of unspecified anesthetic, initial encounter: T41.45XA

## 2013-08-01 HISTORY — DX: Reserved for concepts with insufficient information to code with codable children: IMO0002

## 2013-08-01 HISTORY — DX: Other specified symptoms and signs involving the digestive system and abdomen: R19.8

## 2013-08-01 HISTORY — PX: MASS EXCISION: SHX2000

## 2013-08-01 SURGERY — EXCISION MASS
Anesthesia: General | Site: Finger | Laterality: Right | Wound class: Clean

## 2013-08-01 MED ORDER — HYDROMORPHONE HCL PF 1 MG/ML IJ SOLN: 0.2500 mg | INTRAMUSCULAR | Status: DC | PRN

## 2013-08-01 MED ORDER — FENTANYL CITRATE 0.05 MG/ML IJ SOLN: 50.0000 ug | INTRAMUSCULAR | Status: DC | PRN

## 2013-08-01 MED ORDER — BUPIVACAINE HCL (PF) 0.25 % IJ SOLN
INTRAMUSCULAR | Status: DC | PRN
  Administered 2013-08-01: 5 mL

## 2013-08-01 MED ORDER — LIDOCAINE HCL (PF) 1 % IJ SOLN
INTRAMUSCULAR | Status: AC
  Filled 2013-08-01: qty 30

## 2013-08-01 MED ORDER — MIDAZOLAM HCL 2 MG/2ML IJ SOLN: 1.0000 mg | INTRAMUSCULAR | Status: DC | PRN

## 2013-08-01 MED ORDER — CEFAZOLIN SODIUM-DEXTROSE 2-3 GM-% IV SOLR
2.0000 g | INTRAVENOUS | Status: AC
  Administered 2013-08-01: 2 g via INTRAVENOUS

## 2013-08-01 MED ORDER — OXYCODONE HCL 5 MG PO TABS
5.0000 mg | ORAL_TABLET | Freq: Once | ORAL | Status: AC | PRN
Start: 2013-08-01 — End: 2013-08-01
  Administered 2013-08-01: 5 mg via ORAL

## 2013-08-01 MED ORDER — ONDANSETRON HCL 4 MG/2ML IJ SOLN
INTRAMUSCULAR | Status: DC | PRN
  Administered 2013-08-01: 4 mg via INTRAVENOUS

## 2013-08-01 MED ORDER — PROPOFOL 10 MG/ML IV EMUL
INTRAVENOUS | Status: AC
  Filled 2013-08-01: qty 50

## 2013-08-01 MED ORDER — BUPIVACAINE HCL (PF) 0.25 % IJ SOLN
INTRAMUSCULAR | Status: AC
  Filled 2013-08-01: qty 30

## 2013-08-01 MED ORDER — PROPOFOL 10 MG/ML IV BOLUS
INTRAVENOUS | Status: DC | PRN
  Administered 2013-08-01: 50 mg via INTRAVENOUS
  Administered 2013-08-01: 200 mg via INTRAVENOUS

## 2013-08-01 MED ORDER — CHLORHEXIDINE GLUCONATE 4 % EX LIQD: 60.0000 mL | Freq: Once | CUTANEOUS | Status: DC

## 2013-08-01 MED ORDER — MIDAZOLAM HCL 2 MG/2ML IJ SOLN
INTRAMUSCULAR | Status: AC
  Filled 2013-08-01: qty 2

## 2013-08-01 MED ORDER — OXYCODONE HCL 5 MG PO TABS
ORAL_TABLET | ORAL | Status: AC
  Filled 2013-08-01: qty 1

## 2013-08-01 MED ORDER — LIDOCAINE HCL (CARDIAC) 20 MG/ML IV SOLN
INTRAVENOUS | Status: DC | PRN
  Administered 2013-08-01: 100 mg via INTRAVENOUS

## 2013-08-01 MED ORDER — LACTATED RINGERS IV SOLN
INTRAVENOUS | Status: DC
  Administered 2013-08-01: 08:00:00 via INTRAVENOUS

## 2013-08-01 MED ORDER — HYDROCODONE-ACETAMINOPHEN 5-325 MG PO TABS: ORAL_TABLET | ORAL | Status: DC

## 2013-08-01 MED ORDER — FENTANYL CITRATE 0.05 MG/ML IJ SOLN
INTRAMUSCULAR | Status: DC | PRN
  Administered 2013-08-01: 50 ug via INTRAVENOUS
  Administered 2013-08-01: 100 ug via INTRAVENOUS

## 2013-08-01 MED ORDER — DEXAMETHASONE SODIUM PHOSPHATE 4 MG/ML IJ SOLN
INTRAMUSCULAR | Status: DC | PRN
  Administered 2013-08-01: 10 mg via INTRAVENOUS

## 2013-08-01 MED ORDER — MIDAZOLAM HCL 2 MG/ML PO SYRP: 12.0000 mg | ORAL_SOLUTION | Freq: Once | ORAL | Status: DC | PRN

## 2013-08-01 MED ORDER — OXYCODONE HCL 5 MG/5ML PO SOLN: 5.0000 mg | Freq: Once | ORAL | Status: AC | PRN

## 2013-08-01 MED ORDER — FENTANYL CITRATE 0.05 MG/ML IJ SOLN
INTRAMUSCULAR | Status: AC
  Filled 2013-08-01: qty 6

## 2013-08-01 MED ORDER — MIDAZOLAM HCL 5 MG/5ML IJ SOLN
INTRAMUSCULAR | Status: DC | PRN
  Administered 2013-08-01: 2 mg via INTRAVENOUS

## 2013-08-01 MED ORDER — CEFAZOLIN SODIUM-DEXTROSE 2-3 GM-% IV SOLR
INTRAVENOUS | Status: AC
  Filled 2013-08-01: qty 50

## 2013-08-01 MED ORDER — PROMETHAZINE HCL 25 MG/ML IJ SOLN: 6.2500 mg | INTRAMUSCULAR | Status: DC | PRN

## 2013-08-01 SURGICAL SUPPLY — 51 items
APL SKNCLS STERI-STRIP NONHPOA (GAUZE/BANDAGES/DRESSINGS)
BANDAGE COBAN STERILE 2 (GAUZE/BANDAGES/DRESSINGS) ×1 IMPLANT
BANDAGE CONFORM 2  STR LF (GAUZE/BANDAGES/DRESSINGS) ×1 IMPLANT
BANDAGE ELASTIC 3 VELCRO ST LF (GAUZE/BANDAGES/DRESSINGS) IMPLANT
BANDAGE GAUZE ELAST BULKY 4 IN (GAUZE/BANDAGES/DRESSINGS) IMPLANT
BANDAGE GAUZE STRT 1 STR LF (GAUZE/BANDAGES/DRESSINGS) IMPLANT
BENZOIN TINCTURE PRP APPL 2/3 (GAUZE/BANDAGES/DRESSINGS) IMPLANT
BLADE MINI RND TIP GREEN BEAV (BLADE) IMPLANT
BLADE SURG 15 STRL LF DISP TIS (BLADE) ×2 IMPLANT
BLADE SURG 15 STRL SS (BLADE) ×4
BNDG CMPR 9X4 STRL LF SNTH (GAUZE/BANDAGES/DRESSINGS) ×1
BNDG CMPR MD 5X2 ELC HKLP STRL (GAUZE/BANDAGES/DRESSINGS)
BNDG COHESIVE 1X5 TAN STRL LF (GAUZE/BANDAGES/DRESSINGS) IMPLANT
BNDG ELASTIC 2 VLCR STRL LF (GAUZE/BANDAGES/DRESSINGS) IMPLANT
BNDG ESMARK 4X9 LF (GAUZE/BANDAGES/DRESSINGS) ×1 IMPLANT
BNDG PLASTER X FAST 3X3 WHT LF (CAST SUPPLIES) IMPLANT
BNDG PLSTR 9X3 FST ST WHT (CAST SUPPLIES)
CHLORAPREP W/TINT 26ML (MISCELLANEOUS) ×2 IMPLANT
CORDS BIPOLAR (ELECTRODE) ×2 IMPLANT
COVER MAYO STAND STRL (DRAPES) ×2 IMPLANT
COVER TABLE BACK 60X90 (DRAPES) ×2 IMPLANT
CUFF TOURNIQUET SINGLE 18IN (TOURNIQUET CUFF) ×2 IMPLANT
DRAPE EXTREMITY T 121X128X90 (DRAPE) ×2 IMPLANT
DRAPE SURG 17X23 STRL (DRAPES) ×2 IMPLANT
GAUZE XEROFORM 1X8 LF (GAUZE/BANDAGES/DRESSINGS) ×2 IMPLANT
GLOVE BIO SURGEON STRL SZ7 (GLOVE) ×1 IMPLANT
GLOVE BIO SURGEON STRL SZ7.5 (GLOVE) ×2 IMPLANT
GLOVE BIOGEL PI IND STRL 8 (GLOVE) ×1 IMPLANT
GLOVE BIOGEL PI INDICATOR 8 (GLOVE) ×1
GLOVE EXAM NITRILE MD LF STRL (GLOVE) ×2 IMPLANT
GOWN BRE IMP PREV XXLGXLNG (GOWN DISPOSABLE) ×2 IMPLANT
GOWN PREVENTION PLUS XLARGE (GOWN DISPOSABLE) ×2 IMPLANT
NDL HYPO 25X1 1.5 SAFETY (NEEDLE) ×1 IMPLANT
NEEDLE HYPO 25X1 1.5 SAFETY (NEEDLE) ×2 IMPLANT
NS IRRIG 1000ML POUR BTL (IV SOLUTION) ×2 IMPLANT
PACK BASIN DAY SURGERY FS (CUSTOM PROCEDURE TRAY) ×2 IMPLANT
PAD CAST 3X4 CTTN HI CHSV (CAST SUPPLIES) IMPLANT
PAD CAST 4YDX4 CTTN HI CHSV (CAST SUPPLIES) IMPLANT
PADDING CAST ABS 4INX4YD NS (CAST SUPPLIES) ×1
PADDING CAST ABS COTTON 4X4 ST (CAST SUPPLIES) ×1 IMPLANT
PADDING CAST COTTON 3X4 STRL (CAST SUPPLIES)
PADDING CAST COTTON 4X4 STRL (CAST SUPPLIES)
SPONGE GAUZE 4X4 12PLY (GAUZE/BANDAGES/DRESSINGS) ×2 IMPLANT
STOCKINETTE 4X48 STRL (DRAPES) ×2 IMPLANT
STRIP CLOSURE SKIN 1/2X4 (GAUZE/BANDAGES/DRESSINGS) IMPLANT
SUT ETHILON 3 0 PS 1 (SUTURE) IMPLANT
SUT ETHILON 4 0 PS 2 18 (SUTURE) ×2 IMPLANT
SYR BULB 3OZ (MISCELLANEOUS) ×2 IMPLANT
SYR CONTROL 10ML LL (SYRINGE) ×2 IMPLANT
TOWEL OR 17X24 6PK STRL BLUE (TOWEL DISPOSABLE) ×3 IMPLANT
UNDERPAD 30X30 INCONTINENT (UNDERPADS AND DIAPERS) ×2 IMPLANT

## 2013-08-01 NOTE — H&P (Signed)
  Deanna Thomas is an 32 y.o. female.   Chief Complaint: right hand mass HPI: 32 yo rhd female with mass at base of right long finger x 1.5 months.  It is bothersome to her.  No injuries.  No previous problems in area.  Pain with grip and palpation.  She wishes to have it removed.  Past Medical History  Diagnosis Date  . Migraine   . Asthma   . Cyst of finger 07/2013    right long annular ligament cyst  . Multiple body piercings     nose and both ears - unsure if she can remove for surgery  . Difficulty swallowing pills   . Complication of anesthesia     hard to wake up post-op    Past Surgical History  Procedure Laterality Date  . Abdominal surgery      for ectopic pregnancy  . Cesarean section  02/28/3005; 01/04/2008  . Ectopic pregnancy surgery Left 01/28/2006    mini laparotomy; partial left salpingectomy  . Tubal ligation Right 01/04/2008    History reviewed. No pertinent family history. Social History:  reports that she has never smoked. She has never used smokeless tobacco. She reports that she drinks alcohol. She reports that she does not use illicit drugs.  Allergies:  Allergies  Allergen Reactions  . Imitrex [Sumatriptan Succinate] Rash and Other (See Comments)    FEELS LIKE SKIN IS BURNING  . Bupropion Other (See Comments)    CAUSED A PANIC ATTACK    Medications Prior to Admission  Medication Sig Dispense Refill  . cetirizine (ZYRTEC) 10 MG tablet Take 10 mg by mouth daily.      Marland Kitchen ibuprofen (ADVIL,MOTRIN) 200 MG tablet Take 200 mg by mouth every 6 (six) hours as needed.      . Multiple Vitamin (MULTIVITAMIN) capsule Take 1 capsule by mouth daily.        Marland Kitchen albuterol (PROVENTIL HFA;VENTOLIN HFA) 108 (90 BASE) MCG/ACT inhaler Inhale 2 puffs into the lungs every 6 (six) hours as needed for wheezing or shortness of breath.  3 Inhaler  11  . amitriptyline (ELAVIL) 75 MG tablet Take 1 tablet (75 mg total) by mouth at bedtime.  30 tablet  1    No results found for  this or any previous visit (from the past 48 hour(s)).  No results found.   A comprehensive review of systems was negative except for: Eyes: positive for contacts/glasses Respiratory: positive for asthma Neurological: positive for headaches  Blood pressure 122/70, pulse 84, temperature 98.8 F (37.1 C), temperature source Oral, resp. rate 18, height 5\' 2"  (1.575 m), weight 253 lb (114.76 kg), last menstrual period 07/06/2013, SpO2 97.00%.  General appearance: alert, cooperative and appears stated age Head: Normocephalic, without obvious abnormality, atraumatic Neck: supple, symmetrical, trachea midline Resp: clear to auscultation bilaterally Cardio: regular rate and rhythm GI: non tender Extremities: intact sensation and capillary refill all digits.  +epl/fpl/io.  palpable mass at base right long finger.  no skin changes. Pulses: 2+ and symmetric Skin: Skin color, texture, turgor normal. No rashes or lesions Neurologic: Grossly normal Incision/Wound: none  Assessment/Plan Right long finger annular ligament cyst.  Non operative and operative treatment options were discussed with the patient and patient wishes to proceed with operative treatment. Risks, benefits, and alternatives of surgery were discussed and the patient agrees with the plan of care.   Hashem Goynes R 08/01/2013, 8:30 AM

## 2013-08-01 NOTE — Brief Op Note (Signed)
08/01/2013  9:07 AM  PATIENT:  Deanna Thomas  32 y.o. female  PRE-OPERATIVE DIAGNOSIS:  RIGHT LONG FINGER ANNULAR LIGAMENT CYST  POST-OPERATIVE DIAGNOSIS:  RIGHT LONG FINGER ANNULAR LIGAMENT CYST  PROCEDURE:  Procedure(s): EXCISION MASS RIGHT LONG FINGER (Right)  SURGEON:  Surgeon(s) and Role:    * Tami Ribas, MD - Primary  PHYSICIAN ASSISTANT:   ASSISTANTS: none   ANESTHESIA:   general  EBL:  Total I/O In: 850 [I.V.:850] Out: -   BLOOD ADMINISTERED:none  DRAINS: none   LOCAL MEDICATIONS USED:  MARCAINE     SPECIMEN:  Source of Specimen:  right long finger  DISPOSITION OF SPECIMEN:  PATHOLOGY  COUNTS:  YES  TOURNIQUET:   Total Tourniquet Time Documented: Upper Arm (Right) - 13 minutes Total: Upper Arm (Right) - 13 minutes   DICTATION: .Other Dictation: Dictation Number [id number]  PLAN OF CARE: Discharge to home after PACU  PATIENT DISPOSITION:  PACU - hemodynamically stable.

## 2013-08-01 NOTE — Op Note (Signed)
NAMENathen May              ACCOUNT NO.:  [id number]  MEDICAL RECORD NO.:  [id number]  LOCATION:                                 FACILITY:  PHYSICIAN:  Betha Loa, MD             DATE OF BIRTH:  DATE OF PROCEDURE:  08/01/2013 DATE OF DISCHARGE:                              OPERATIVE REPORT   PREOPERATIVE DIAGNOSIS:  Right long finger annular ligament cyst.  POSTOPERATIVE DIAGNOSIS:  Right long finger annular ligament cyst.  PROCEDURE:  Excision of mass, right long finger with release of A1 pulley.  SURGEON:  Betha Loa, MD  ASSISTANTS:  None.  ANESTHESIA:  General.  IV FLUIDS:  Per anesthesia flow sheet.  ESTIMATED BLOOD LOSS:  Minimal.  COMPLICATIONS:  None.  SPECIMENS:  Right long finger mass to Pathology.  TOURNIQUET TIME:  13 minutes.  DISPOSITION:  Stable to PACU.  INDICATIONS:  Ms. Deanna Thomas is a 32 year old female who has noted a mass in her right long finger for approximately 1-1/2 months.  This was bothersome to her.  She has pain when she grips or palpates it.  She has had no previous problems in the area.  We discussed nonoperative and operative treatment options and she wishes to have it excised.  Risks, benefits and alternatives of the surgery were discussed including the risk of blood loss, infection, damage to nerves, vessels, tendons, ligaments, bone, failure of surgery, need for additional surgery, complications with wound healing, continued pain, and recurrence of the mass.  She voiced understanding of these risks and elected to proceed.  OPERATIVE COURSE:  After being identified preoperatively by myself, the patient and I agreed upon procedure and site of procedure.  Surgical site was marked.  The risks, benefits, and alternatives of surgery were reviewed and she wished to proceed.  Surgical consent had been signed. She was given IV Ancef as preoperative antibiotic prophylaxis.  She was transferred to the operating room and placed on  the operating table in supine position with the right upper extremity on arm board.  General anesthesia was induced by the anesthesiologist.  The right upper extremity was prepped and draped in normal sterile orthopedic fashion. A surgical pause was performed between surgeons, anesthesia, and operating room staff, and all were in agreement as to the patient, procedure, and site of procedure.  Tourniquet at the proximal aspect of the extremity was inflated to 250 mmHg after exsanguination of the limb with Esmarch bandage.  A Brunner-type incision was made volarly at the MP joint of the long finger.  This was carried down into subcutaneous tissues by spreading technique.  The radial and ulnar neurovascular bundles were identified and were intact.  These were not involved in the mass.  There was a apparent annular ligament cyst coming from the A1 pulley.  It was freed of its soft tissue adhesions.  There was a boggy fat between the long and ring finger metacarpals, but no mass in this area.  It was felt that release of the A1 pulley was appropriate rather than just taking a window out of the pulley to prevent any creation of triggering in the finger.  The  A1 pulley was released and the mass excised from it.  The mass was sent to Pathology for examination.  The wound was copiously irrigated with sterile saline.  It was then closed with 4-0 nylon in a horizontal mattress fashion.  It was injected with 5 mL of 0.25% plain Marcaine to aid in postoperative analgesia.  It was then dressed with sterile Xeroform, 4x4s, and wrapped with a Kling and Coban dressing lightly.  Tourniquet was deflated at 13 minutes. Fingertips were pink with brisk capillary refill after deflation of tourniquet.  Operative drapes were broken down.  The patient was awoken from anesthesia safely.  She was transferred back to stretcher and taken to PACU in stable condition.  I will see her back in the office in 1 week for  postoperative followup.  I will give her Norco 5/325, 1-2 p.o. q.6 hours p.r.n. pain, dispensed #30.     Betha Loa, MD     KK/MEDQ  D:  08/01/2013  T:  08/01/2013  Job:  [id number]

## 2013-08-01 NOTE — Anesthesia Postprocedure Evaluation (Signed)
Anesthesia Post Note  Patient: Deanna Thomas  Procedure(s) Performed: Procedure(s) (LRB): EXCISION MASS RIGHT LONG FINGER (Right)  Anesthesia type: general  Patient location: PACU  Post pain: Pain level controlled  Post assessment: Patient's Cardiovascular Status Stable  Last Vitals:  Filed Vitals:   08/01/13 1027  BP: 107/70  Pulse: 83  Temp: 36.5 C  Resp: 16    Post vital signs: Reviewed and stable  Level of consciousness: sedated  Complications: No apparent anesthesia complications

## 2013-08-01 NOTE — Transfer of Care (Signed)
Immediate Anesthesia Transfer of Care Note  Patient: Deanna Thomas  Procedure(s) Performed: Procedure(s): EXCISION MASS RIGHT LONG FINGER (Right)  Patient Location: PACU  Anesthesia Type:General  Level of Consciousness: awake, alert  and oriented  Airway & Oxygen Therapy: Patient Spontanous Breathing  Post-op Assessment: Report given to PACU RN and Post -op Vital signs reviewed and stable  Post vital signs: Reviewed and stable  Complications: No apparent anesthesia complications

## 2013-08-01 NOTE — Op Note (Signed)
[id number] 

## 2013-08-01 NOTE — Anesthesia Preprocedure Evaluation (Signed)
Anesthesia Evaluation  Patient identified by MRN, date of birth, ID band Patient awake    Reviewed: Allergy & Precautions, H&P , NPO status , Patient's Chart, lab work & pertinent test results  History of Anesthesia Complications Negative for: history of anesthetic complications  Airway Mallampati: II TM Distance: >3 FB Neck ROM: full    Dental  (+) Teeth Intact and Dental Advidsory Given   Pulmonary asthma ,  breath sounds clear to auscultation        Cardiovascular negative cardio ROS  Rhythm:regular Rate:Normal     Neuro/Psych PSYCHIATRIC DISORDERS negative neurological ROS     GI/Hepatic negative GI ROS, Neg liver ROS,   Endo/Other  Morbid obesity  Renal/GU negative Renal ROS     Musculoskeletal   Abdominal   Peds  Hematology  (+) anemia ,   Anesthesia Other Findings   Reproductive/Obstetrics negative OB ROS                           Anesthesia Physical Anesthesia Plan  ASA: II  Anesthesia Plan: General LMA   Post-op Pain Management:    Induction:   Airway Management Planned:   Additional Equipment:   Intra-op Plan:   Post-operative Plan:   Informed Consent: I have reviewed the patients History and Physical, chart, labs and discussed the procedure including the risks, benefits and alternatives for the proposed anesthesia with the patient or authorized representative who has indicated his/her understanding and acceptance.   Dental Advisory Given  Plan Discussed with: Anesthesiologist, CRNA and Surgeon  Anesthesia Plan Comments:         Anesthesia Quick Evaluation

## 2013-08-02 ENCOUNTER — Encounter (HOSPITAL_BASED_OUTPATIENT_CLINIC_OR_DEPARTMENT_OTHER): Payer: Self-pay | Admitting: Orthopedic Surgery

## 2013-08-19 ENCOUNTER — Ambulatory Visit (HOSPITAL_BASED_OUTPATIENT_CLINIC_OR_DEPARTMENT_OTHER): Payer: 59 | Attending: Family Medicine

## 2013-08-19 VITALS — Ht 62.0 in | Wt 250.0 lb

## 2013-08-19 DIAGNOSIS — G4733 Obstructive sleep apnea (adult) (pediatric): Secondary | ICD-10-CM

## 2013-08-19 DIAGNOSIS — G4730 Sleep apnea, unspecified: Secondary | ICD-10-CM

## 2013-08-19 DIAGNOSIS — G4710 Hypersomnia, unspecified: Secondary | ICD-10-CM | POA: Insufficient documentation

## 2013-08-27 DIAGNOSIS — G4730 Sleep apnea, unspecified: Secondary | ICD-10-CM

## 2013-08-27 DIAGNOSIS — G4733 Obstructive sleep apnea (adult) (pediatric): Secondary | ICD-10-CM

## 2013-08-27 NOTE — Sleep Study (Signed)
   NAME: Deanna Thomas DATE OF BIRTH:  08-11-1981 MEDICAL RECORD NUMBER [id number]  LOCATION: [facility]  PHYSICIAN: Jermaine Neuharth D  DATE OF STUDY: 08/19/2013  SLEEP STUDY TYPE: Nocturnal Polysomnogram               REFERRING PHYSICIAN: Nestor Ramp, MD  INDICATION FOR STUDY: Hypersomnia with sleep apnea  EPWORTH SLEEPINESS SCORE:   13/24 HEIGHT: 5\' 2"  (157.5 cm)  WEIGHT: 250 lb (113.399 kg)    Body mass index is 45.71 kg/(m^2).  NECK SIZE: 15 in.  MEDICATIONS: Charted for review  SLEEP ARCHITECTURE: Total sleep time 338 minutes with sleep efficiency 93.4%. Stage I was 2.8%, stage II 53.6%, stage III 0.3%, REM 43.3% of total sleep time. Sleep latency 20 minutes, REM latency 57 minutes, awake after sleep onset 5 minutes, arousal index 5.3. Bedtime medication: None  RESPIRATORY DATA: Apnea hypopnea index (AHI) 4.4 per hour. A total of 25 events were scored including 2 obstructive apneas and 23 hypopneas. Most events were while supine. REM AHI 9.4 per hour. There were insufficient numbers of events to permit split protocol CPAP titration.  OXYGEN DATA: Mild snoring with oxygen desaturation to a nadir of 91% and mean oxygen saturation through the study of 96.8% on room air  CARDIAC DATA: Normal sinus rhythm  MOVEMENT/PARASOMNIA: No significant movement disturbance. No bathroom trips  IMPRESSION/ RECOMMENDATION:   1) Occasional respiratory events with sleep disturbance, within normal limits. AHI of 4.4 per hour (the normal range for adults is an AHI from 0-5 per hour). Mild snoring with oxygen desaturation to a nadir of 91% and mean oxygen saturation through the study of 96.8% on room air.   Signed Jetty Duhamel M.D. Waymon Budge Diplomate, American Board of Sleep Medicine  ELECTRONICALLY SIGNED ON:  08/27/2013, 2:09 PM [facility] PH: [phone]   FX: [phone] ACCREDITED BY THE AMERICAN ACADEMY OF SLEEP MEDICINE

## 2013-12-05 ENCOUNTER — Other Ambulatory Visit: Payer: Self-pay | Admitting: Obstetrics and Gynecology

## 2013-12-05 ENCOUNTER — Other Ambulatory Visit: Payer: Self-pay

## 2013-12-05 DIAGNOSIS — N644 Mastodynia: Secondary | ICD-10-CM

## 2013-12-07 ENCOUNTER — Ambulatory Visit
Admission: RE | Admit: 2013-12-07 | Discharge: 2013-12-07 | Disposition: A | Discharge status: Home / Self Care | Payer: 59 | Source: Ambulatory Visit | Attending: Obstetrics and Gynecology | Admitting: Obstetrics and Gynecology

## 2013-12-07 DIAGNOSIS — N644 Mastodynia: Secondary | ICD-10-CM

## 2014-06-23 ENCOUNTER — Other Ambulatory Visit: Payer: Self-pay | Admitting: Obstetrics and Gynecology

## 2014-06-23 DIAGNOSIS — N644 Mastodynia: Secondary | ICD-10-CM

## 2014-07-17 ENCOUNTER — Ambulatory Visit
Admission: RE | Admit: 2014-07-17 | Discharge: 2014-07-17 | Disposition: A | Discharge status: Home / Self Care | Payer: 59 | Source: Ambulatory Visit | Attending: Obstetrics and Gynecology | Admitting: Obstetrics and Gynecology

## 2014-07-17 DIAGNOSIS — N644 Mastodynia: Secondary | ICD-10-CM

## 2014-12-15 ENCOUNTER — Other Ambulatory Visit: Payer: Self-pay | Admitting: Obstetrics and Gynecology

## 2014-12-15 DIAGNOSIS — N63 Unspecified lump in unspecified breast: Secondary | ICD-10-CM

## 2015-01-12 ENCOUNTER — Other Ambulatory Visit: Payer: Self-pay | Admitting: Specialist

## 2015-01-12 DIAGNOSIS — H539 Unspecified visual disturbance: Secondary | ICD-10-CM

## 2015-01-28 ENCOUNTER — Ambulatory Visit
Admission: RE | Admit: 2015-01-28 | Discharge: 2015-01-28 | Disposition: A | Discharge status: Home / Self Care | Payer: 59 | Source: Ambulatory Visit | Attending: Specialist | Admitting: Specialist

## 2015-01-28 DIAGNOSIS — H539 Unspecified visual disturbance: Secondary | ICD-10-CM

## 2015-01-30 ENCOUNTER — Other Ambulatory Visit: Payer: Self-pay

## 2015-01-30 LAB — HM PAP SMEAR: HM Pap smear: NORMAL

## 2015-01-31 LAB — CYTOLOGY - PAP

## 2015-02-23 ENCOUNTER — Other Ambulatory Visit: Payer: Self-pay | Admitting: Obstetrics and Gynecology

## 2015-02-23 ENCOUNTER — Ambulatory Visit
Admission: RE | Admit: 2015-02-23 | Discharge: 2015-02-23 | Disposition: A | Discharge status: Home / Self Care | Payer: 59 | Source: Ambulatory Visit | Attending: Obstetrics and Gynecology | Admitting: Obstetrics and Gynecology

## 2015-02-23 DIAGNOSIS — N63 Unspecified lump in unspecified breast: Secondary | ICD-10-CM

## 2015-02-23 LAB — HM MAMMOGRAPHY

## 2015-03-29 LAB — LIPID PANEL
Cholesterol: 202 mg/dL — AB (ref 0–200)
HDL: 37 mg/dL (ref 35–70)
LDL Cholesterol: 121 mg/dL
Triglycerides: 220 mg/dL — AB (ref 40–160)

## 2015-03-29 LAB — BASIC METABOLIC PANEL: Glucose: 80 mg/dL

## 2015-03-29 LAB — HEMOGLOBIN A1C: Hgb A1c MFr Bld: 5.5 % (ref 4.0–6.0)

## 2015-04-17 ENCOUNTER — Ambulatory Visit (INDEPENDENT_AMBULATORY_CARE_PROVIDER_SITE_OTHER): Payer: 59 | Admitting: Internal Medicine

## 2015-04-17 ENCOUNTER — Encounter: Payer: Self-pay | Admitting: Internal Medicine

## 2015-04-17 VITALS — BP 108/76 | HR 64 | Temp 98.1°F | Ht 62.0 in | Wt 252.0 lb

## 2015-04-17 DIAGNOSIS — G43809 Other migraine, not intractable, without status migrainosus: Secondary | ICD-10-CM

## 2015-04-17 DIAGNOSIS — F321 Major depressive disorder, single episode, moderate: Secondary | ICD-10-CM

## 2015-04-17 DIAGNOSIS — E669 Obesity, unspecified: Secondary | ICD-10-CM | POA: Diagnosis not present

## 2015-04-17 DIAGNOSIS — M722 Plantar fascial fibromatosis: Secondary | ICD-10-CM | POA: Diagnosis not present

## 2015-04-17 DIAGNOSIS — J4520 Mild intermittent asthma, uncomplicated: Secondary | ICD-10-CM

## 2015-04-17 DIAGNOSIS — G4719 Other hypersomnia: Secondary | ICD-10-CM

## 2015-04-17 MED ORDER — MELOXICAM 15 MG PO TABS: 15.0000 mg | ORAL_TABLET | Freq: Every day | ORAL | Status: DC

## 2015-04-17 NOTE — Patient Instructions (Signed)
Plantar Fasciitis (Heel Spur Syndrome) with Rehab The plantar fascia is a fibrous, ligament-like, soft-tissue structure that spans the bottom of the foot. Plantar fasciitis is a condition that causes pain in the foot due to inflammation of the tissue. SYMPTOMS   Pain and tenderness on the underneath side of the foot.  Pain that worsens with standing or walking. CAUSES  Plantar fasciitis is caused by irritation and injury to the plantar fascia on the underneath side of the foot. Common mechanisms of injury include:  Direct trauma to bottom of the foot.  Damage to a small nerve that runs under the foot where the main fascia attaches to the heel bone.  Stress placed on the plantar fascia due to bone spurs. RISK INCREASES WITH:   Activities that place stress on the plantar fascia (running, jumping, pivoting, or cutting).  Poor strength and flexibility.  Improperly fitted shoes.  Tight calf muscles.  Flat feet.  Failure to warm-up properly before activity.  Obesity. PREVENTION  Warm up and stretch properly before activity.  Allow for adequate recovery between workouts.  Maintain physical fitness:  Strength, flexibility, and endurance.  Cardiovascular fitness.  Maintain a health body weight.  Avoid stress on the plantar fascia.  Wear properly fitted shoes, including arch supports for individuals who have flat feet. PROGNOSIS  If treated properly, then the symptoms of plantar fasciitis usually resolve without surgery. However, occasionally surgery is necessary. RELATED COMPLICATIONS   Recurrent symptoms that may result in a chronic condition.  Problems of the lower back that are caused by compensating for the injury, such as limping.  Pain or weakness of the foot during push-off following surgery.  Chronic inflammation, scarring, and partial or complete fascia tear, occurring more often from repeated injections. TREATMENT  Treatment initially involves the use of  ice and medication to help reduce pain and inflammation. The use of strengthening and stretching exercises may help reduce pain with activity, especially stretches of the Achilles tendon. These exercises may be performed at home or with a therapist. Your caregiver may recommend that you use heel cups of arch supports to help reduce stress on the plantar fascia. Occasionally, corticosteroid injections are given to reduce inflammation. If symptoms persist for greater than 6 months despite non-surgical (conservative), then surgery may be recommended.  MEDICATION   If pain medication is necessary, then nonsteroidal anti-inflammatory medications, such as aspirin and ibuprofen, or other minor pain relievers, such as acetaminophen, are often recommended.  Do not take pain medication within 7 days before surgery.  Prescription pain relievers may be given if deemed necessary by your caregiver. Use only as directed and only as much as you need.  Corticosteroid injections may be given by your caregiver. These injections should be reserved for the most serious cases, because they may only be given a certain number of times. HEAT AND COLD  Cold treatment (icing) relieves pain and reduces inflammation. Cold treatment should be applied for 10 to 15 minutes every 2 to 3 hours for inflammation and pain and immediately after any activity that aggravates your symptoms. Use ice packs or massage the area with a piece of ice (ice massage).  Heat treatment may be used prior to performing the stretching and strengthening activities prescribed by your caregiver, physical therapist, or athletic trainer. Use a heat pack or soak the injury in warm water. SEEK IMMEDIATE MEDICAL CARE IF:  Treatment seems to offer no benefit, or the condition worsens.  Any medications produce adverse side effects. EXERCISES RANGE   OF MOTION (ROM) AND STRETCHING EXERCISES - Plantar Fasciitis (Heel Spur Syndrome) These exercises may help you  when beginning to rehabilitate your injury. Your symptoms may resolve with or without further involvement from your physician, physical therapist or athletic trainer. While completing these exercises, remember:   Restoring tissue flexibility helps normal motion to return to the joints. This allows healthier, less painful movement and activity.  An effective stretch should be held for at least 30 seconds.  A stretch should never be painful. You should only feel a gentle lengthening or release in the stretched tissue. RANGE OF MOTION - Toe Extension, Flexion  Sit with your right / left leg crossed over your opposite knee.  Grasp your toes and gently pull them back toward the top of your foot. You should feel a stretch on the bottom of your toes and/or foot.  Hold this stretch for __________ seconds.  Now, gently pull your toes toward the bottom of your foot. You should feel a stretch on the top of your toes and or foot.  Hold this stretch for __________ seconds. Repeat __________ times. Complete this stretch __________ times per day.  RANGE OF MOTION - Ankle Dorsiflexion, Active Assisted  Remove shoes and sit on a chair that is preferably not on a carpeted surface.  Place right / left foot under knee. Extend your opposite leg for support.  Keeping your heel down, slide your right / left foot back toward the chair until you feel a stretch at your ankle or calf. If you do not feel a stretch, slide your bottom forward to the edge of the chair, while still keeping your heel down.  Hold this stretch for __________ seconds. Repeat __________ times. Complete this stretch __________ times per day.  STRETCH - Gastroc, Standing  Place hands on wall.  Extend right / left leg, keeping the front knee somewhat bent.  Slightly point your toes inward on your back foot.  Keeping your right / left heel on the floor and your knee straight, shift your weight toward the wall, not allowing your back to  arch.  You should feel a gentle stretch in the right / left calf. Hold this position for __________ seconds. Repeat __________ times. Complete this stretch __________ times per day. STRETCH - Soleus, Standing  Place hands on wall.  Extend right / left leg, keeping the other knee somewhat bent.  Slightly point your toes inward on your back foot.  Keep your right / left heel on the floor, bend your back knee, and slightly shift your weight over the back leg so that you feel a gentle stretch deep in your back calf.  Hold this position for __________ seconds. Repeat __________ times. Complete this stretch __________ times per day. STRETCH - Gastrocsoleus, Standing  Note: This exercise can place a lot of stress on your foot and ankle. Please complete this exercise only if specifically instructed by your caregiver.   Place the ball of your right / left foot on a step, keeping your other foot firmly on the same step.  Hold on to the wall or a rail for balance.  Slowly lift your other foot, allowing your body weight to press your heel down over the edge of the step.  You should feel a stretch in your right / left calf.  Hold this position for __________ seconds.  Repeat this exercise with a slight bend in your right / left knee. Repeat __________ times. Complete this stretch __________ times per day.    STRENGTHENING EXERCISES - Plantar Fasciitis (Heel Spur Syndrome)  These exercises may help you when beginning to rehabilitate your injury. They may resolve your symptoms with or without further involvement from your physician, physical therapist or athletic trainer. While completing these exercises, remember:   Muscles can gain both the endurance and the strength needed for everyday activities through controlled exercises.  Complete these exercises as instructed by your physician, physical therapist or athletic trainer. Progress the resistance and repetitions only as guided. STRENGTH -  Towel Curls  Sit in a chair positioned on a non-carpeted surface.  Place your foot on a towel, keeping your heel on the floor.  Pull the towel toward your heel by only curling your toes. Keep your heel on the floor.  If instructed by your physician, physical therapist or athletic trainer, add ____________________ at the end of the towel. Repeat __________ times. Complete this exercise __________ times per day. STRENGTH - Ankle Inversion  Secure one end of a rubber exercise band/tubing to a fixed object (table, pole). Loop the other end around your foot just before your toes.  Place your fists between your knees. This will focus your strengthening at your ankle.  Slowly, pull your big toe up and in, making sure the band/tubing is positioned to resist the entire motion.  Hold this position for __________ seconds.  Have your muscles resist the band/tubing as it slowly pulls your foot back to the starting position. Repeat __________ times. Complete this exercises __________ times per day.  Document Released: 08/25/2005 Document Revised: 11/17/2011 Document Reviewed: 12/07/2008 Exit[employer] Patient Information 2015 [employer]. This information is not intended to replace advice given to you by your health care provider. Make sure you discuss any questions you have with your health care provider.  

## 2015-04-17 NOTE — Assessment & Plan Note (Signed)
Advised her to stop the Topomax She can continue to use Flexeril prn She will [facility]

## 2015-04-17 NOTE — Assessment & Plan Note (Signed)
Start Meloxicam daily Continue to wear your shoe inserts If pain persist, make follow up appt with Dr. Lorelei Pont

## 2015-04-17 NOTE — Assessment & Plan Note (Signed)
Encouraged her to work on diet and exercise 

## 2015-04-17 NOTE — Assessment & Plan Note (Signed)
Continue albuterol prn °

## 2015-04-17 NOTE — Progress Notes (Signed)
HPI Pt presents to the clinic today to establish care and for management of the conditions listed below.  Flu: 06/2014 Tetanus: 2009 Pap Smear: 01/2015 Mammogram: 02/2015, every 6 months, often followed by ultrasound Dentist: biannually  Migraine: She is not taking the Topomax daily because she forgets. She feels like it keeps her awake at night. She has never been on any other preventative in the past. She does take Flexeril prn which provides good relief if she takes it right when she gets a headache. [facility] is [facility] the [facility].    Depression: This occured while she was going through a divorce. She is moody at times but denies reoccurance of depression. She denies anxiety, SI/HI.  Allergies: These occur all year long. She alternates between Claritin and Zyrtec, whichever one happens to be cheaper.  She also has concerns about being verweight. She has tried diet and exercise and she has not been able to get below 240. She has had her thyroid checked in the past and it has been normal. She is not sure why she can not lose weight.  She also c/o pain her feet . This has been going on 2-3 months. The pain is in her arch, heel and radiates up her leg. She describes the pain as sharp and stabbing. Her symptoms are worse first thing in the morning or after sitting for long periods of time. She has gotten shoe inserts, and she is not sure if it helping. She denies swelling, numbness or tingling.   She reports she falls asleep at work. It doesn't happen at home. She feels like it is because she is bored while at work and she is not able to move around very much. She sleeps about 5-6 hours per sleep. She did have a sleep study in the past and is not sure of what the results were.  Past Medical History  Diagnosis Date  . Migraine   . Asthma   . Cyst of finger 07/2013    right long annular ligament cyst  . Multiple body piercings     nose and both ears - unsure if she can remove  for surgery  . Difficulty swallowing pills   . Complication of anesthesia     hard to wake up post-op  . Depression   . Allergy     Current Outpatient Prescriptions  Medication Sig Dispense Refill  . albuterol (PROVENTIL HFA;VENTOLIN HFA) 108 (90 BASE) MCG/ACT inhaler Inhale 2 puffs into the lungs every 6 (six) hours as needed for wheezing or shortness of breath. 3 Inhaler 11  . cyclobenzaprine (FLEXERIL) 10 MG tablet Take 10 mg by mouth as needed (for migraine--no mroe than 2 tablets per week).    Marland Kitchen ibuprofen (ADVIL,MOTRIN) 200 MG tablet Take 200 mg by mouth every 6 (six) hours as needed.    . loratadine (CLARITIN) 10 MG tablet Take 10 mg by mouth daily.    . Multiple Vitamin (MULTIVITAMIN) capsule Take 1 capsule by mouth daily.      Marland Kitchen topiramate (TOPAMAX) 50 MG tablet Take 50 mg by mouth at bedtime as needed.    . cetirizine (ZYRTEC) 10 MG tablet Take 10 mg by mouth daily.     No current facility-administered medications for this visit.    Allergies  Allergen Reactions  . Imitrex [Sumatriptan Succinate] Rash and Other (See Comments)    FEELS LIKE SKIN IS BURNING  . Bupropion Other (See Comments)    CAUSED A PANIC ATTACK  Family History  Problem Relation Age of Onset  . Breast cancer Maternal Aunt   . Breast cancer Maternal Grandmother   . Heart disease Maternal Grandmother   . Hypertension Maternal Grandmother   . Cancer Paternal Grandmother     ovarian/uterine? unsure    History   Social History  . Marital Status: Legally Separated    Spouse Name: N/A  . Number of Children: N/A  . Years of Education: N/A   Occupational History  . Not on file.   Social History Main Topics  . Smoking status: Never Smoker   . Smokeless tobacco: Never Used  . Alcohol Use: Yes     Comment: occasionally  . Drug Use: No  . Sexual Activity: Not on file   Other Topics Concern  . Not on file   Social History Narrative    ROS:  Constitutional: Pt reports excessive daytime  sleepiness and weight gain. Denies fever, malaise, fatigue, headache.  HEENT: Denies eye pain, eye redness, ear pain, ringing in the ears, wax buildup, runny nose, nasal congestion, bloody nose, or sore throat. Respiratory: Denies difficulty breathing, shortness of breath, cough or sputum production.   Cardiovascular: Denies chest pain, chest tightness, palpitations or swelling in the hands or feet.  Gastrointestinal: Denies abdominal pain, bloating, constipation, diarrhea or blood in the stool.  GU: Denies frequency, urgency, pain with urination, blood in urine, odor or discharge. Musculoskeletal: Pt reports pain in feet. Denies decrease in range of motion, muscle pain or joint pain and swelling.  Skin: Denies redness, rashes, lesions or ulcercations.  Neurological: Denies dizziness, difficulty with memory, difficulty with speech or problems with balance and coordination.  Psych: Denies anxiety, depression, SI/HI.  No other specific complaints in a complete review of systems (except as listed in HPI above).  PE:  BP 108/76 mmHg  Pulse 64  Temp(Src) 98.1 F (36.7 C) (Oral)  Ht 5' 2"  (1.575 m)  Wt 252 lb (114.306 kg)  BMI 46.08 kg/m2  SpO2 98%  LMP 03/22/2015 Wt Readings from Last 3 Encounters:  04/17/15 252 lb (114.306 kg)  08/19/13 250 lb (113.399 kg)  08/01/13 253 lb (114.76 kg)    General: Appears her stated age, obese in NAD. HEENT: Head: normal shape and size; Eyes: sclera white, no icterus, conjunctiva pink, PERRLA and EOMs intact; Ears: Tm's gray and intact, normal light reflex;  Throat/Mouth: Teeth present, mucosa pink and moist, no lesions or ulcerations noted.  Neck:. Neck supple, trachea midline. No masses, lumps or thyromegaly present.  Cardiovascular: Normal rate and rhythm. S1,S2 noted.  No murmur, rubs or gallops noted.  Pulmonary/Chest: Normal effort and positive vesicular breath sounds. No respiratory distress. No wheezes, rales or ronchi noted.  Musculoskeletal:  Pain with palpation of bilateral heels and arches. No swelling noted. No difficulty with gait.  Neurological: Alert and oriented.  Psychiatric: Mood and affect normal. Behavior is normal. Judgment and thought content normal.     BMET    Component Value Date/Time   NA 139 10/22/2011 1515   NA 139 10/22/2011 1515   NA 141 12/25/2008 2037   K 4.0 10/22/2011 1515   K 4.0 10/22/2011 1515   K 4.3 12/25/2008 2037   CL 107 10/22/2011 1515   CL 107 10/22/2011 1515   CL 104 12/25/2008 2037   CO2 27 10/22/2011 1515   CO2 27 10/22/2011 1515   CO2 26 12/25/2008 2037   GLUCOSE 85 10/22/2011 1515   GLUCOSE 86 12/25/2008 2037   BUN 14  10/22/2011 1515   BUN 14 10/22/2011 1515   BUN 13 12/25/2008 2037   CREATININE 0.86 10/22/2011 1515   CREATININE 0.86 10/22/2011 1515   CREATININE 0.63 12/25/2008 2037   CALCIUM 8.9 10/22/2011 1515   CALCIUM 8.9 10/22/2011 1515   CALCIUM 9.0 12/25/2008 2037   GFRNONAA >60 06/16/2007 1549   GFRAA  06/16/2007 1549    >60        The eGFR has been calculated using the MDRD equation. This calculation has not been validated in all clinical    Lipid Panel  No results found for: CHOL, TRIG, HDL, CHOLHDL, VLDL, LDLCALC  CBC    Component Value Date/Time   WBC 8.8 10/22/2011 1515   WBC 8.8 10/22/2011 1515   RBC 4.54 10/22/2011 1515   RBC 4.93 12/25/2008 2037   HGB 13.0 10/22/2011 1515   HGB 13.0 10/22/2011 1515   HCT 39 10/22/2011 1515   HCT 38.5 10/22/2011 1515   HCT 39.8 12/25/2008 2037   PLT 273 10/22/2011 1515   PLT 273 10/22/2011 1515   PLT 297 12/25/2008 2037   MCV 85 10/22/2011 1515   MCV 80.7 12/25/2008 2037   MCH 28.6 10/22/2011 1515   MCHC 33.7 10/22/2011 1515   MCHC 31.7 12/25/2008 2037   RDW 13.6 10/22/2011 1515   RDW 15.8* 12/25/2008 2037   LYMPHSABS 1.7 12/25/2008 2037   MONOABS 1.1* 12/25/2008 2037   EOSABS 0.4 12/25/2008 2037   BASOSABS 0.0 12/25/2008 2037    Hgb A1C No results found for: HGBA1C   Assessment and  Plan:  Excessive daytime sleepiness:  She is not getting enough sleep Some concern about OSA Will request records from prior PCP May need referral to pulm for repeat sleep study  RTC as needed

## 2015-04-17 NOTE — Progress Notes (Signed)
Pre visit review using our clinic review tool, if applicable. No additional management support is needed unless otherwise documented below in the visit note. 

## 2015-04-17 NOTE — Assessment & Plan Note (Signed)
No reoccurrence Will continue to monitor 

## 2015-05-08 ENCOUNTER — Encounter: Payer: Self-pay | Admitting: Internal Medicine

## 2015-05-10 ENCOUNTER — Encounter: Payer: Self-pay | Admitting: Family Medicine

## 2015-05-10 ENCOUNTER — Ambulatory Visit (INDEPENDENT_AMBULATORY_CARE_PROVIDER_SITE_OTHER): Payer: 59 | Admitting: Family Medicine

## 2015-05-10 VITALS — BP 96/56 | HR 64 | Temp 98.9°F | Ht 62.0 in | Wt 247.2 lb

## 2015-05-10 DIAGNOSIS — M722 Plantar fascial fibromatosis: Secondary | ICD-10-CM | POA: Diagnosis not present

## 2015-05-10 NOTE — Patient Instructions (Signed)
Please read handouts on Plantar Fascitis.  STRETCHING and Strengthening program critically important.  Strengthening on foot and calf muscles as seen in handout. Calf raises, 2 legged, then 1 legged. Foot massage with tennis ball. Ice massage.  NEEDS TO BE DONE EVERY DAY  Recommended over the counter insoles. (Spenco or Hapad)  A rigid shoe with good arch support helps: Dansko (great), Jennet Maduro, Merrell No easily bendable shoes.    Excellent Over the Counter Orthotics:  Hapad: available at www.hapad.com (Green Sports Insoles)  SPENCO: Available at some sports stores or MagazineAlert.pl. (I prefer the full-length green orthotic that has a yellow bottom / base)  www.pedifix.com and "Orwin" website: multiple good foot products  SHOES: Danskos, Merrells, Keens, Clarks, Birkenstocks - good arch support, want minimal bendability. Finn Comfort also excellent, but even more expensive.  Tennis Shoes / Running Shoes: Many good companies and styles. The most important thing is to get a good fit and wear a shoe that feels good in the store. Walk around in the store. Run if that is something that you can do. Some of the running stores have a treadmill, also.  Brooks, New Balance, Karleen Hampshire are good, but the most important thing is to wear a shoe that fits your foot and feels good.  [employer] in [place]: excellent staff, IT consultant (Running and [employer]), OTC orthotics. On [address] across from the [employer]. For running shoe and athletic shoe fit, they are the best.  Nicer shoes:  Birkenstock shoes, [place]: Excellent selection  [employer], [place] [place], [place]: They have the largest selection of comfort and supportive shoes that I have ever seen, and their staff is generally quite good in fitting you for shoes. (They will intermittently have sales, mail coupons, and you can look on their website.)  [employer]: [address], [place], [place]

## 2015-05-10 NOTE — Progress Notes (Signed)
Dr. Frederico Hamman T. Hung Rhinesmith, MD, [facility] [place], [address] Phone: [phone] Fax: [phone]  05/10/2015  Patient: Deanna Thomas, MRN: [id number], DOB: April 19, 1981, 34 y.o.  Primary Physician:  Webb Silversmith, NP  Chief Complaint: Plantar Fasciitis  Subjective:   This 34 y.o. female patient presents with a 12 month long history of heel pain. This is notable for worsening pain first thing in the morning when arising and standing after sitting.   Has been having foot pain in both of her feet.  Had to change doctors and started to come here.   Took some Mobic, #30.   Feet bothering, arch to heel R > L, now in both.   Prior foot or ankle fractures: none Prior operations: none Orthotics or bracing: OTC semi-custom Dr. Zoe Lan Medications: NSAIDS PT or home rehab: none Night splints: no Ice massage: no Ball massage: seemed to not help  Metatarsal pain: no  The PMH, PSH, Social History, Family History, Medications, and allergies have been reviewed in [facility], and have been updated if relevant.  Patient Active Problem List   Diagnosis Date Noted  . Plantar fasciitis, bilateral 04/17/2015  . Depression, major, single episode 10/22/2011  . Obesity 06/03/2011  . CARPAL TUNNEL SYNDROME, BILATERAL 07/17/2008  . Migraine 11/05/2006  . Asthma 11/05/2006    Past Medical History  Diagnosis Date  . Migraine   . Asthma   . Cyst of finger 07/2013    right long annular ligament cyst  . Multiple body piercings     nose and both ears - unsure if she can remove for surgery  . Difficulty swallowing pills   . Complication of anesthesia     hard to wake up post-op  . Depression   . Allergy     Past Surgical History  Procedure Laterality Date  . Abdominal surgery      for ectopic pregnancy  . Cesarean section  02/28/3005; 01/04/2008  . Ectopic pregnancy surgery Left 01/28/2006    mini laparotomy; partial left  salpingectomy  . Tubal ligation Right 01/04/2008  . Mass excision Right 08/01/2013    Procedure: EXCISION MASS RIGHT LONG FINGER;  Surgeon: Tennis Must, MD;  Location: [facility];  Service: Orthopedics;  Laterality: Right;    Social History   Social History  . Marital Status: Legally Separated    Spouse Name: N/A  . Number of Children: N/A  . Years of Education: N/A   Occupational History  . Not on file.   Social History Main Topics  . Smoking status: Never Smoker   . Smokeless tobacco: Never Used  . Alcohol Use: Yes     Comment: occasionally  . Drug Use: No  . Sexual Activity: Yes    Birth Control/ Protection: Surgical   Other Topics Concern  . Not on file   Social History Narrative    Family History  Problem Relation Age of Onset  . Breast cancer Maternal Aunt   . Breast cancer Maternal Grandmother   . Heart disease Maternal Grandmother   . Hypertension Maternal Grandmother   . Cancer Paternal Grandmother     ovarian/uterine? unsure    Allergies  Allergen Reactions  . Imitrex [Sumatriptan Succinate] Rash and Other (See Comments)    FEELS LIKE SKIN IS BURNING  . Bupropion Other (See Comments)    CAUSED A PANIC ATTACK    Medication list reviewed and updated in full in [facility].  GEN: No fevers, chills. Nontoxic. Primarily MSK c/o today. MSK: Detailed in the HPI GI: tolerating PO intake without difficulty Neuro: No numbness, parasthesias, or tingling associated. Otherwise the pertinent positives of the ROS are noted above.   Objective:   Blood pressure 96/56, pulse 64, temperature 98.9 F (37.2 C), temperature source Oral, height 5\' 2"  (1.575 m), weight 247 lb 4 oz (112.152 kg), last menstrual period 04/20/2015.  GEN: Well-developed,well-nourished,in no acute distress; alert,appropriate and cooperative throughout examination HEENT: Normocephalic and atraumatic without obvious abnormalities. Ears, externally no deformities PULM:  Breathing comfortably in no respiratory distress EXT: No clubbing, cyanosis, or edema PSYCH: Normally interactive. Cooperative during the interview. Pleasant. Friendly and conversant. Not anxious or depressed appearing. Normal, full affect.  B feet Echymosis: no Edema: no ROM: full LE B Gait: heel toe, non-antalgic MT pain: no Callus pattern: none Lateral Mall: NT Medial Mall: NT Talus: NT Navicular: NT Calcaneous: NT Metatarsals: NT 5th MT: NT Phalanges: NT Achilles: NT Plantar Fascia: tender, medial along PF. Pain with forced dorsi Fat Pad: NT Peroneals: NT Post Tib: NT Great Toe: Nml motion Ant Drawer: neg Other foot breakdown: none Long arch: mild breakdown Transverse arch: preserved Hindfoot breakdown: none Sensation: intact  Assessment and Plan:   Plantar fasciitis, bilateral   Anatomy reviewed. Stretching and rehab are critically important to the treatment of PF. Reviewed footwear. Rigid soles have been shown to help with PF. Reviewed basic anatomy and PF questions  Reviewed rehab of stretching and calf raises.  Reviewed rehab from [facility]  Could benefit from a corticosteroid injection if conservative treatment fails.  Refer to the patient instructions sections for details of plan shared with patient.   I appreciate the opportunity to evaluate this very friendly patient. If you have any question regarding her care or prognosis, do not hesitate to ask.   Follow-up: if not improved in 4-6 months  Patient Instructions  Please read handouts on Plantar Fascitis.  STRETCHING and Strengthening program critically important.  Strengthening on foot and calf muscles as seen in handout. Calf raises, 2 legged, then 1 legged. Foot massage with tennis ball. Ice massage.  NEEDS TO BE DONE EVERY DAY  Recommended over the counter insoles. (Spenco or Hapad)  A rigid shoe with good arch support helps: Dansko (great), Jennet Maduro,  Merrell No easily bendable shoes.    Excellent Over the Counter Orthotics:  Hapad: available at www.hapad.com (Green Sports Insoles)  SPENCO: Available at some sports stores or MagazineAlert.pl. (I prefer the full-length green orthotic that has a yellow bottom / base)  www.pedifix.com and "[place]" website: multiple good foot products  SHOES: Danskos, Merrells, Keens, Clarks, Birkenstocks - good arch support, want minimal bendability. Finn Comfort also excellent, but even more expensive.  Tennis Shoes / Running Shoes: Many good companies and styles. The most important thing is to get a good fit and wear a shoe that feels good in the store. Walk around in the store. Run if that is something that you can do. Some of the running stores have a treadmill, also.  Brooks, New Balance, Karleen Hampshire are good, but the most important thing is to wear a shoe that fits your foot and feels good.  [employer] in [place]: excellent staff, IT consultant (Running and [employer]), OTC orthotics. On [employer] across from the [employer]. For running shoe and athletic shoe fit, they are the best.  Nicer shoes:  Birkenstock shoes, [facility], [place]: Excellent selection  [employer], [address] [place], [place]: They have the largest selection of comfort and supportive shoes that I have ever seen, and their staff is generally quite good in fitting you for shoes. (They will intermittently have sales, mail coupons, and you can look on their website.)  [employer]: [address], [place], [place]        Signed,  Vevay T. Starletta Houchin, MD   Patient's Medications  New Prescriptions   No medications on file  Previous Medications   ALBUTEROL (PROVENTIL HFA;VENTOLIN HFA) 108 (90 BASE) MCG/ACT INHALER    Inhale 2 puffs into the lungs every 6 (six) hours as needed for wheezing or shortness of breath.   CETIRIZINE (ZYRTEC) 10 MG TABLET    Take 10 mg by mouth daily.    CYCLOBENZAPRINE (FLEXERIL) 10 MG TABLET    Take 10 mg by mouth as needed (for migraine--no mroe than 2 tablets per week).   IBUPROFEN (ADVIL,MOTRIN) 200 MG TABLET    Take 200 mg by mouth every 6 (six) hours as needed.   LORATADINE (CLARITIN) 10 MG TABLET    Take 10 mg by mouth daily.   MULTIPLE VITAMIN (MULTIVITAMIN) CAPSULE    Take 1 capsule by mouth daily.     TOPIRAMATE (TOPAMAX) 50 MG TABLET    Take 50 mg by mouth at bedtime as needed.  Modified Medications   No medications on file  Discontinued Medications   MELOXICAM (MOBIC) 15 MG TABLET    Take 1 tablet (15 mg total) by mouth daily.

## 2015-05-10 NOTE — Progress Notes (Signed)
Pre visit review using our clinic review tool, if applicable. No additional management support is needed unless otherwise documented below in the visit note. 

## 2015-05-18 ENCOUNTER — Encounter: Payer: Self-pay | Admitting: Internal Medicine

## 2015-06-21 ENCOUNTER — Encounter: Payer: Self-pay | Admitting: Internal Medicine

## 2015-06-21 ENCOUNTER — Ambulatory Visit (INDEPENDENT_AMBULATORY_CARE_PROVIDER_SITE_OTHER): Payer: 59 | Admitting: Internal Medicine

## 2015-06-21 VITALS — BP 104/70 | HR 62 | Temp 98.2°F | Wt 240.0 lb

## 2015-06-21 DIAGNOSIS — F32A Depression, unspecified: Secondary | ICD-10-CM

## 2015-06-21 DIAGNOSIS — F329 Major depressive disorder, single episode, unspecified: Secondary | ICD-10-CM

## 2015-06-21 DIAGNOSIS — G4700 Insomnia, unspecified: Secondary | ICD-10-CM

## 2015-06-21 DIAGNOSIS — Z0289 Encounter for other administrative examinations: Secondary | ICD-10-CM | POA: Diagnosis not present

## 2015-06-21 MED ORDER — TRAZODONE HCL 50 MG PO TABS: 25.0000 mg | ORAL_TABLET | Freq: Every evening | ORAL | Status: DC | PRN

## 2015-06-21 MED ORDER — CITALOPRAM HYDROBROMIDE 20 MG PO TABS: 20.0000 mg | ORAL_TABLET | Freq: Every day | ORAL | Status: DC
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
## 2015-06-21 NOTE — Patient Instructions (Signed)
Major Depressive Disorder Major depressive disorder is a mental illness. It also may be called clinical depression or unipolar depression. Major depressive disorder usually causes feelings of sadness, hopelessness, or helplessness. Some people with this disorder do not feel particularly sad but lose interest in doing things they used to enjoy (anhedonia). Major depressive disorder also can cause physical symptoms. It can interfere with work, school, relationships, and other normal everyday activities. The disorder varies in severity but is longer lasting and more serious than the sadness we all feel from time to time in our lives. Major depressive disorder often is triggered by stressful life events or major life changes. Examples of these triggers include divorce, loss of your job or home, a move, and the death of a family member or close friend. Sometimes this disorder occurs for no obvious reason at all. People who have family members with major depressive disorder or bipolar disorder are at higher risk for developing this disorder, with or without life stressors. Major depressive disorder can occur at any age. It may occur just once in your life (single episode major depressive disorder). It may occur multiple times (recurrent major depressive disorder). SYMPTOMS People with major depressive disorder have either anhedonia or depressed mood on nearly a daily basis for at least 2 weeks or longer. Symptoms of depressed mood include:  Feelings of sadness (blue or down in the dumps) or emptiness.  Feelings of hopelessness or helplessness.  Tearfulness or episodes of crying (may be observed by others).  Irritability (children and adolescents). In addition to depressed mood or anhedonia or both, people with this disorder have at least four of the following symptoms:  Difficulty sleeping or sleeping too much.   Significant change (increase or decrease) in appetite or weight.   Lack of energy or  motivation.  Feelings of guilt and worthlessness.   Difficulty concentrating, remembering, or making decisions.  Unusually slow movement (psychomotor retardation) or restlessness (as observed by others).   Recurrent wishes for death, recurrent thoughts of self-harm (suicide), or a suicide attempt. People with major depressive disorder commonly have persistent negative thoughts about themselves, other people, and the world. People with severe major depressive disorder may experiencedistorted beliefs or perceptions about the world (psychotic delusions). They also may see or hear things that are not real (psychotic hallucinations). DIAGNOSIS Major depressive disorder is diagnosed through an assessment by your health care provider. Your health care provider will ask aboutaspects of your daily life, such as mood,sleep, and appetite, to see if you have the diagnostic symptoms of major depressive disorder. Your health care provider may ask about your medical history and use of alcohol or drugs, including prescription medicines. Your health care provider also may do a physical exam and blood work. This is because certain medical conditions and the use of certain substances can cause major depressive disorder-like symptoms (secondary depression). Your health care provider also may refer you to a mental health specialist for further evaluation and treatment. TREATMENT It is important to recognize the symptoms of major depressive disorder and seek treatment. The following treatments can be prescribed for this disorder:   Medicine. Antidepressant medicines usually are prescribed. Antidepressant medicines are thought to correct chemical imbalances in the brain that are commonly associated with major depressive disorder. Other types of medicine may be added if the symptoms do not respond to antidepressant medicines alone or if psychotic delusions or hallucinations occur.  Talk therapy. Talk therapy can be  helpful in treating major depressive disorder by providing   support, education, and guidance. Certain types of talk therapy also can help with negative thinking (cognitive behavioral therapy) and with relationship issues that trigger this disorder (interpersonal therapy). A mental health specialist can help determine which treatment is best for you. Most people with major depressive disorder do well with a combination of medicine and talk therapy. Treatments involving electrical stimulation of the brain can be used in situations with extremely severe symptoms or when medicine and talk therapy do not work over time. These treatments include electroconvulsive therapy, transcranial magnetic stimulation, and vagal nerve stimulation.   This information is not intended to replace advice given to you by your health care provider. Make sure you discuss any questions you have with your health care provider.   Document Released: 12/20/2012 Document Revised: 09/15/2014 Document Reviewed: 12/20/2012 Elsevier Interactive Patient Education 2016 Elsevier Inc.  

## 2015-06-21 NOTE — Progress Notes (Signed)
Pre visit review using our clinic review tool, if applicable. No additional management support is needed unless otherwise documented below in the visit note. 
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
## 2015-07-04 ENCOUNTER — Other Ambulatory Visit: Payer: Self-pay

## 2015-07-04 DIAGNOSIS — F32A Depression, unspecified: Secondary | ICD-10-CM

## 2015-07-04 DIAGNOSIS — F329 Major depressive disorder, single episode, unspecified: Secondary | ICD-10-CM

## 2015-07-04 MED ORDER — TOPIRAMATE 50 MG PO TABS: 50.0000 mg | ORAL_TABLET | Freq: Every evening | ORAL | Status: DC | PRN

## 2015-07-04 MED ORDER — CITALOPRAM HYDROBROMIDE 20 MG PO TABS: 20.0000 mg | ORAL_TABLET | Freq: Every day | ORAL | Status: DC

## 2015-07-04 NOTE — Telephone Encounter (Signed)
Incoming fax requesting Rx sent to mail order--please advise

## 2015-07-24 ENCOUNTER — Encounter: Payer: Self-pay | Admitting: Emergency Medicine

## 2015-07-24 ENCOUNTER — Emergency Department
Admission: EM | Admit: 2015-07-24 | Discharge: 2015-07-24 | Disposition: A | Discharge status: Home / Self Care | Payer: 59 | Attending: Emergency Medicine | Admitting: Emergency Medicine

## 2015-07-24 ENCOUNTER — Emergency Department: Payer: 59

## 2015-07-24 DIAGNOSIS — S161XXA Strain of muscle, fascia and tendon at neck level, initial encounter: Secondary | ICD-10-CM | POA: Diagnosis not present

## 2015-07-24 DIAGNOSIS — S199XXA Unspecified injury of neck, initial encounter: Secondary | ICD-10-CM | POA: Diagnosis present

## 2015-07-24 DIAGNOSIS — Y998 Other external cause status: Secondary | ICD-10-CM | POA: Diagnosis not present

## 2015-07-24 DIAGNOSIS — Y92410 Unspecified street and highway as the place of occurrence of the external cause: Secondary | ICD-10-CM | POA: Diagnosis not present

## 2015-07-24 DIAGNOSIS — Y9389 Activity, other specified: Secondary | ICD-10-CM | POA: Insufficient documentation

## 2015-07-24 DIAGNOSIS — S3992XA Unspecified injury of lower back, initial encounter: Secondary | ICD-10-CM | POA: Diagnosis not present

## 2015-07-24 DIAGNOSIS — S0990XA Unspecified injury of head, initial encounter: Secondary | ICD-10-CM | POA: Diagnosis not present

## 2015-07-24 MED ORDER — CYCLOBENZAPRINE HCL 10 MG PO TABS: 10.0000 mg | ORAL_TABLET | Freq: Three times a day (TID) | ORAL | Status: DC | PRN

## 2015-07-24 MED ORDER — IBUPROFEN 800 MG PO TABS: 800.0000 mg | ORAL_TABLET | Freq: Three times a day (TID) | ORAL | Status: DC | PRN
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
## 2015-07-24 NOTE — ED Notes (Signed)
Moving all extremities equally and strong.  Gait steady. Posture relaxed.

## 2015-07-24 NOTE — Discharge Instructions (Signed)
Cervical Sprain  A cervical sprain is when the tissues (ligaments) that hold the neck bones in place stretch or tear.  HOME CARE   · Put ice on the injured area.    Put ice in a plastic bag.    Place a towel between your skin and the bag.    Leave the ice on for 15-20 minutes, 3-4 times a day.  · You may have been given a collar to wear. This collar keeps your neck from moving while you heal.    Do not take the collar off unless told by your doctor.    If you have long hair, keep it outside of the collar.    Ask your doctor before changing the position of your collar. You may need to change its position over time to make it more comfortable.    If you are allowed to take off the collar for cleaning or bathing, follow your doctor's instructions on how to do it safely.    Keep your collar clean by wiping it with mild soap and water. Dry it completely. If the collar has removable pads, remove them every 1-2 days to hand wash them with soap and water. Allow them to air dry. They should be dry before you wear them in the collar.    Do not drive while wearing the collar.  · Only take medicine as told by your doctor.  · Keep all doctor visits as told.  · Keep all physical therapy visits as told.  · Adjust your work station so that you have good posture while you work.  · Avoid positions and activities that make your problems worse.  · Warm up and stretch before being active.  GET HELP IF:  · Your pain is not controlled with medicine.  · You cannot take less pain medicine over time as planned.  · Your activity level does not improve as expected.  GET HELP RIGHT AWAY IF:   · You are bleeding.  · Your stomach is upset.  · You have an allergic reaction to your medicine.  · You develop new problems that you cannot explain.  · You lose feeling (become numb) or you cannot move any part of your body (paralysis).  · You have tingling or weakness in any part of your body.  · Your symptoms get worse. Symptoms include:    Pain,  soreness, stiffness, puffiness (swelling), or a burning feeling in your neck.    Pain when your neck is touched.    Shoulder or upper back pain.    Limited ability to move your neck.    Headache.    Dizziness.    Your hands or arms feel week, lose feeling, or tingle.    Muscle spasms.    Difficulty swallowing or chewing.  MAKE SURE YOU:   · Understand these instructions.  · Will watch your condition.  · Will get help right away if you are not doing well or get worse.     This information is not intended to replace advice given to you by your health care provider. Make sure you discuss any questions you have with your health care provider.     Document Released: 02/11/2008 Document Revised: 04/27/2013 Document Reviewed: 03/02/2013  Elsevier Interactive Patient Education ©2016 [employer]    Motor Vehicle Collision  It is common to have multiple bruises and sore muscles after a motor vehicle collision (MVC). These tend to feel worse for the first 24 hours.   You may have the most stiffness and soreness over the first several hours. You may also feel worse when you wake up the first morning after your collision. After this point, you will usually begin to improve with each day. The speed of improvement often depends on the severity of the collision, the number of injuries, and the location and nature of these injuries.  HOME CARE INSTRUCTIONS  · Put ice on the injured area.    Put ice in a plastic bag.    Place a towel between your skin and the bag.    Leave the ice on for 15-20 minutes, 3-4 times a day, or as directed by your health care provider.  · Drink enough fluids to keep your urine clear or pale yellow. Do not drink alcohol.  · Take a warm shower or bath once or twice a day. This will increase blood flow to sore muscles.  · You may return to activities as directed by your caregiver. Be careful when lifting, as this may aggravate neck or back pain.  · Only take over-the-counter or prescription medicines for  pain, discomfort, or fever as directed by your caregiver. Do not use aspirin. This may increase bruising and bleeding.  SEEK IMMEDIATE MEDICAL CARE IF:  · You have numbness, tingling, or weakness in the arms or legs.  · You develop severe headaches not relieved with medicine.  · You have severe neck pain, especially tenderness in the middle of the back of your neck.  · You have changes in bowel or bladder control.  · There is increasing pain in any area of the body.  · You have shortness of breath, light-headedness, dizziness, or fainting.  · You have chest pain.  · You feel sick to your stomach (nauseous), throw up (vomit), or sweat.  · You have increasing abdominal discomfort.  · There is blood in your urine, stool, or vomit.  · You have pain in your shoulder (shoulder strap areas).  · You feel your symptoms are getting worse.  MAKE SURE YOU:  · Understand these instructions.  · Will watch your condition.  · Will get help right away if you are not doing well or get worse.     This information is not intended to replace advice given to you by your health care provider. Make sure you discuss any questions you have with your health care provider.     Document Released: 08/25/2005 Document Revised: 09/15/2014 Document Reviewed: 01/22/2011  Elsevier Interactive Patient Education ©2016 [employer]
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
## 2015-07-24 NOTE — ED Notes (Signed)
AAOx3.  Skin warm and dry.  Moving all extremities equally and strong.  Ambulates with easy and steady gait, posture relaxed.

## 2016-01-16 ENCOUNTER — Ambulatory Visit (INDEPENDENT_AMBULATORY_CARE_PROVIDER_SITE_OTHER): Payer: 59 | Admitting: Family Medicine

## 2016-01-16 VITALS — BP 100/80 | HR 96 | Temp 98.7°F | Ht 62.0 in | Wt 235.0 lb

## 2016-01-16 DIAGNOSIS — B349 Viral infection, unspecified: Secondary | ICD-10-CM

## 2016-01-16 NOTE — Patient Instructions (Signed)
Viral Infections °A viral infection can be caused by different types of viruses. Most viral infections are not serious and resolve on their own. However, some infections may cause severe symptoms and may lead to further complications. °SYMPTOMS °Viruses can frequently cause: °· Minor sore throat. °· Aches and pains. °· Headaches. °· Runny nose. °· Different types of rashes. °· Watery eyes. °· Tiredness. °· Cough. °· Loss of appetite. °· Gastrointestinal infections, resulting in nausea, vomiting, and diarrhea. °These symptoms do not respond to antibiotics because the infection is not caused by bacteria. However, you might catch a bacterial infection following the viral infection. This is sometimes called a "superinfection." Symptoms of such a bacterial infection may include: °· Worsening sore throat with pus and difficulty swallowing. °· Swollen neck glands. °· Chills and a high or persistent fever. °· Severe headache. °· Tenderness over the sinuses. °· Persistent overall ill feeling (malaise), muscle aches, and tiredness (fatigue). °· Persistent cough. °· Yellow, green, or brown mucus production with coughing. °HOME CARE INSTRUCTIONS  °· Only take over-the-counter or prescription medicines for pain, discomfort, diarrhea, or fever as directed by your caregiver. °· Drink enough water and fluids to keep your urine clear or pale yellow. Sports drinks can provide valuable electrolytes, sugars, and hydration. °· Get plenty of rest and maintain proper nutrition. Soups and broths with crackers or rice are fine. °SEEK IMMEDIATE MEDICAL CARE IF:  °· You have severe headaches, shortness of breath, chest pain, neck pain, or an unusual rash. °· You have uncontrolled vomiting, diarrhea, or you are unable to keep down fluids. °· You or your child has an oral temperature above 102° F (38.9° C), not controlled by medicine. °· Your baby is older than 3 months with a rectal temperature of 102° F (38.9° C) or higher. °· Your baby is 3  months old or younger with a rectal temperature of 100.4° F (38° C) or higher. °MAKE SURE YOU:  °· Understand these instructions. °· Will watch your condition. °· Will get help right away if you are not doing well or get worse. °  °This information is not intended to replace advice given to you by your health care provider. Make sure you discuss any questions you have with your health care provider. °  °Document Released: 06/04/2005 Document Revised: 11/17/2011 Document Reviewed: 01/31/2015 °Elsevier Interactive Patient Education ©2016 [employer] ° °

## 2016-01-16 NOTE — Progress Notes (Signed)
   Subjective:    [name], female    DOB: 01-May-1981, 35 y.o.   MRN: [id number]  HPI Acute visit Patient seen with one-day history of possible low-grade fever, chills, body aches, malaise, rhinorrhea, sore throat, and mostly nonproductive cough. She's taken some ibuprofen with mild relief. Denies any nausea or vomiting. No sick contacts. Nonsmoker. Missed work yesterday and today secondary to symptoms as above  Past Medical History  Diagnosis Date  . Migraine   . Asthma   . Cyst of finger 07/2013    right long annular ligament cyst  . Multiple body piercings     nose and both ears - unsure if she can remove for surgery  . Difficulty swallowing pills   . Complication of anesthesia     hard to wake up post-op  . Depression   . Allergy    Past Surgical History  Procedure Laterality Date  . Abdominal surgery      for ectopic pregnancy  . Cesarean section  02/28/3005; 01/04/2008  . Ectopic pregnancy surgery Left 01/28/2006    mini laparotomy; partial left salpingectomy  . Tubal ligation Right 01/04/2008  . Mass excision Right 08/01/2013    Procedure: EXCISION MASS RIGHT LONG FINGER;  Surgeon: Tennis Must, MD;  Location: [place];  Service: Orthopedics;  Laterality: Right;    reports that she has never smoked. She has never used smokeless tobacco. She reports that she drinks alcohol. She reports that she does not use illicit drugs. family history includes Breast cancer in her maternal aunt and maternal grandmother; Cancer in her paternal grandmother; Heart disease in her maternal grandmother; Hypertension in her maternal grandmother. Allergies  Allergen Reactions  . Imitrex [Sumatriptan Succinate] Rash and Other (See Comments)    FEELS LIKE SKIN IS BURNING  . Bupropion Other (See Comments)    CAUSED A PANIC ATTACK      Review of Systems  Constitutional: Positive for fever, chills and fatigue.  HENT: Positive for congestion and sore  throat. Negative for ear pain.   Respiratory: Positive for cough. Negative for wheezing.   Cardiovascular: Negative for chest pain.  Musculoskeletal: Positive for myalgias.  Skin: Negative for rash.       Objective:   Physical Exam  Constitutional: She appears well-developed and well-nourished.  HENT:  Right Ear: External ear normal.  Left Ear: External ear normal.  Mouth/Throat: Oropharynx is clear and moist.  Neck: Neck supple.  Cardiovascular: Normal rate and regular rhythm.   Pulmonary/Chest: Effort normal and breath sounds normal. No respiratory distress. She has no wheezes. She has no rales.  Lymphadenopathy:    She has no cervical adenopathy.          Assessment & Plan:  Probable viral URI with cough. Nonfocal exam. Continue ibuprofen and/or Tylenol for body aches and stay well-hydrated. Over-the-counter cough medications as needed. Work note written from yesterday through May 12  Eulas Post MD [facility]

## 2016-01-16 NOTE — Progress Notes (Signed)
Pre visit review using our clinic review tool, if applicable. No additional management support is needed unless otherwise documented below in the visit note. 

## 2016-06-27 ENCOUNTER — Ambulatory Visit (INDEPENDENT_AMBULATORY_CARE_PROVIDER_SITE_OTHER): Payer: 59 | Admitting: Internal Medicine

## 2016-06-27 ENCOUNTER — Encounter: Payer: Self-pay | Admitting: Internal Medicine

## 2016-06-27 VITALS — BP 102/70 | HR 70 | Temp 98.1°F | Ht 62.0 in | Wt 235.0 lb

## 2016-06-27 DIAGNOSIS — Z23 Encounter for immunization: Secondary | ICD-10-CM

## 2016-06-27 DIAGNOSIS — Z0000 Encounter for general adult medical examination without abnormal findings: Secondary | ICD-10-CM | POA: Diagnosis not present

## 2016-06-27 DIAGNOSIS — Z113 Encounter for screening for infections with a predominantly sexual mode of transmission: Secondary | ICD-10-CM

## 2016-06-27 NOTE — Patient Instructions (Signed)
Health Maintenance, Female Adopting a healthy lifestyle and getting preventive care can go a long way to promote health and wellness. Talk with your health care provider about what schedule of regular examinations is right for you. This is a good chance for you to check in with your provider about disease prevention and staying healthy. In between checkups, there are plenty of things you can do on your own. Experts have done a lot of research about which lifestyle changes and preventive measures are most likely to keep you healthy. Ask your health care provider for more information. WEIGHT AND DIET  Eat a healthy diet  Be sure to include plenty of vegetables, fruits, low-fat dairy products, and lean protein.  Do not eat a lot of foods high in solid fats, added sugars, or salt.  Get regular exercise. This is one of the most important things you can do for your health.  Most adults should exercise for at least 150 minutes each week. The exercise should increase your heart rate and make you sweat (moderate-intensity exercise).  Most adults should also do strengthening exercises at least twice a week. This is in addition to the moderate-intensity exercise.  Maintain a healthy weight  Body mass index (BMI) is a measurement that can be used to identify possible weight problems. It estimates body fat based on height and weight. Your health care provider can help determine your BMI and help you achieve or maintain a healthy weight.  For females 20 years of age and older:   A BMI below 18.5 is considered underweight.  A BMI of 18.5 to 24.9 is normal.  A BMI of 25 to 29.9 is considered overweight.  A BMI of 30 and above is considered obese.  Watch levels of cholesterol and blood lipids  You should start having your blood tested for lipids and cholesterol at 33 years of age, then have this test every 5 years.  You may need to have your cholesterol levels checked more often if:  Your lipid  or cholesterol levels are high.  You are older than 33 years of age.  You are at high risk for heart disease.  CANCER SCREENING   Lung Cancer  Lung cancer screening is recommended for adults 55-80 years old who are at high risk for lung cancer because of a history of smoking.  A yearly low-dose CT scan of the lungs is recommended for people who:  Currently smoke.  Have quit within the past 15 years.  Have at least a 30-pack-year history of smoking. A pack year is smoking an average of one pack of cigarettes a day for 1 year.  Yearly screening should continue until it has been 15 years since you quit.  Yearly screening should stop if you develop a health problem that would prevent you from having lung cancer treatment.  Breast Cancer  Practice breast self-awareness. This means understanding how your breasts normally appear and feel.  It also means doing regular breast self-exams. Let your health care provider know about any changes, no matter how small.  If you are in your 20s or 30s, you should have a clinical breast exam (CBE) by a health care provider every 1-3 years as part of a regular health exam.  If you are 40 or older, have a CBE every year. Also consider having a breast X-ray (mammogram) every year.  If you have a family history of breast cancer, talk to your health care provider about genetic screening.  If you   are at high risk for breast cancer, talk to your health care provider about having an MRI and a mammogram every year.  Breast cancer gene (BRCA) assessment is recommended for women who have family members with BRCA-related cancers. BRCA-related cancers include:  Breast.  Ovarian.  Tubal.  Peritoneal cancers.  Results of the assessment will determine the need for genetic counseling and BRCA1 and BRCA2 testing. Cervical Cancer Your health care provider may recommend that you be screened regularly for cancer of the pelvic organs (ovaries, uterus, and  vagina). This screening involves a pelvic examination, including checking for microscopic changes to the surface of your cervix (Pap test). You may be encouraged to have this screening done every 3 years, beginning at age 21.  For women ages 30-65, health care providers may recommend pelvic exams and Pap testing every 3 years, or they may recommend the Pap and pelvic exam, combined with testing for human papilloma virus (HPV), every 5 years. Some types of HPV increase your risk of cervical cancer. Testing for HPV may also be done on women of any age with unclear Pap test results.  Other health care providers may not recommend any screening for nonpregnant women who are considered low risk for pelvic cancer and who do not have symptoms. Ask your health care provider if a screening pelvic exam is right for you.  If you have had past treatment for cervical cancer or a condition that could lead to cancer, you need Pap tests and screening for cancer for at least 20 years after your treatment. If Pap tests have been discontinued, your risk factors (such as having a new sexual partner) need to be reassessed to determine if screening should resume. Some women have medical problems that increase the chance of getting cervical cancer. In these cases, your health care provider may recommend more frequent screening and Pap tests. Colorectal Cancer  This type of cancer can be detected and often prevented.  Routine colorectal cancer screening usually begins at 33 years of age and continues through 33 years of age.  Your health care provider may recommend screening at an earlier age if you have risk factors for colon cancer.  Your health care provider may also recommend using home test kits to check for hidden blood in the stool.  A small camera at the end of a tube can be used to examine your colon directly (sigmoidoscopy or colonoscopy). This is done to check for the earliest forms of colorectal  cancer.  Routine screening usually begins at age 50.  Direct examination of the colon should be repeated every 5-10 years through 33 years of age. However, you may need to be screened more often if early forms of precancerous polyps or small growths are found. Skin Cancer  Check your skin from head to toe regularly.  Tell your health care provider about any new moles or changes in moles, especially if there is a change in a mole's shape or color.  Also tell your health care provider if you have a mole that is larger than the size of a pencil eraser.  Always use sunscreen. Apply sunscreen liberally and repeatedly throughout the day.  Protect yourself by wearing long sleeves, pants, a wide-brimmed hat, and sunglasses whenever you are outside. HEART DISEASE, DIABETES, AND HIGH BLOOD PRESSURE   High blood pressure causes heart disease and increases the risk of stroke. High blood pressure is more likely to develop in:  People who have blood pressure in the high end   of the normal range (130-139/85-89 mm Hg).  People who are overweight or obese.  People who are African American.  If you are 38-23 years of age, have your blood pressure checked every 3-5 years. If you are 61 years of age or older, have your blood pressure checked every year. You should have your blood pressure measured twice--once when you are at a hospital or clinic, and once when you are not at a hospital or clinic. Record the average of the two measurements. To check your blood pressure when you are not at a hospital or clinic, you can use:  An automated blood pressure machine at a pharmacy.  A home blood pressure monitor.  If you are between 45 years and 39 years old, ask your health care provider if you should take aspirin to prevent strokes.  Have regular diabetes screenings. This involves taking a blood sample to check your fasting blood sugar level.  If you are at a normal weight and have a low risk for diabetes,  have this test once every three years after 33 years of age.  If you are overweight and have a high risk for diabetes, consider being tested at a younger age or more often. PREVENTING INFECTION  Hepatitis B  If you have a higher risk for hepatitis B, you should be screened for this virus. You are considered at high risk for hepatitis B if:  You were born in a country where hepatitis B is common. Ask your health care provider which countries are considered high risk.  Your parents were born in a high-risk country, and you have not been immunized against hepatitis B (hepatitis B vaccine).  You have HIV or AIDS.  You use needles to inject street drugs.  You live with someone who has hepatitis B.  You have had sex with someone who has hepatitis B.  You get hemodialysis treatment.  You take certain medicines for conditions, including cancer, organ transplantation, and autoimmune conditions. Hepatitis C  Blood testing is recommended for:  Everyone born from [birth year] through 1963.  Anyone with known risk factors for hepatitis C. Sexually transmitted infections (STIs)  You should be screened for sexually transmitted infections (STIs) including gonorrhea and chlamydia if:  You are sexually active and are younger than 33 years of age.  You are older than 33 years of age and your health care provider tells you that you are at risk for this type of infection.  Your sexual activity has changed since you were last screened and you are at an increased risk for chlamydia or gonorrhea. Ask your health care provider if you are at risk.  If you do not have HIV, but are at risk, it may be recommended that you take a prescription medicine daily to prevent HIV infection. This is called pre-exposure prophylaxis (PrEP). You are considered at risk if:  You are sexually active and do not regularly use condoms or know the HIV status of your partner(s).  You take drugs by injection.  You are sexually  active with a partner who has HIV. Talk with your health care provider about whether you are at high risk of being infected with HIV. If you choose to begin PrEP, you should first be tested for HIV. You should then be tested every 3 months for as long as you are taking PrEP.  PREGNANCY   If you are premenopausal and you may become pregnant, ask your health care provider about preconception counseling.  If you may  become pregnant, take 400 to 800 micrograms (mcg) of folic acid every day.  If you want to prevent pregnancy, talk to your health care provider about birth control (contraception). OSTEOPOROSIS AND MENOPAUSE   Osteoporosis is a disease in which the bones lose minerals and strength with aging. This can result in serious bone fractures. Your risk for osteoporosis can be identified using a bone density scan.  If you are 61 years of age or older, or if you are at risk for osteoporosis and fractures, ask your health care provider if you should be screened.  Ask your health care provider whether you should take a calcium or vitamin D supplement to lower your risk for osteoporosis.  Menopause may have certain physical symptoms and risks.  Hormone replacement therapy may reduce some of these symptoms and risks. Talk to your health care provider about whether hormone replacement therapy is right for you.  HOME CARE INSTRUCTIONS   Schedule regular health, dental, and eye exams.  Stay current with your immunizations.   Do not use any tobacco products including cigarettes, chewing tobacco, or electronic cigarettes.  If you are pregnant, do not drink alcohol.  If you are breastfeeding, limit how much and how often you drink alcohol.  Limit alcohol intake to no more than 1 drink per day for nonpregnant women. One drink equals 12 ounces of beer, 5 ounces of wine, or 1 ounces of hard liquor.  Do not use street drugs.  Do not share needles.  Ask your health care provider for help if  you need support or information about quitting drugs.  Tell your health care provider if you often feel depressed.  Tell your health care provider if you have ever been abused or do not feel safe at home.   This information is not intended to replace advice given to you by your health care provider. Make sure you discuss any questions you have with your health care provider.   Document Released: 03/10/2011 Document Revised: 09/15/2014 Document Reviewed: 07/27/2013 Elsevier Interactive Patient Education [employer].

## 2016-06-27 NOTE — Progress Notes (Signed)
Subjective:    [name], female    DOB: 1981/02/22, 34 y.o.   MRN: [id number]  HPI  Pt presents to the clinic today for her annual exam.  Flu: 06/2014 Tetanus: 2009 Pap Smear: 01/2015, scheduled for 07/2016 Mammogram: 02/2015, scheduled for 07/2016 Dentist: biannually  Diet: She does eat meat. She consumes fruits and veggies most days of the week. She does eat fried foods. She drinks mostly soda and tea. Exercise: She is playing softball 1 day a week for about an hour.  Review of Systems      Past Medical History:  Diagnosis Date  . Allergy   . Asthma   . Complication of anesthesia    hard to wake up post-op  . 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 Depression   . Difficulty swallowing pills   . Migraine   . Multiple body piercings    nose and both ears - unsure if she can remove for surgery    Current Outpatient Prescriptions  Medication Sig Dispense Refill  . albuterol (PROVENTIL HFA;VENTOLIN HFA) 108 (90 BASE) MCG/ACT inhaler Inhale 2 puffs into the lungs every 6 (six) hours as needed for wheezing or shortness of breath. 3 Inhaler 11  . cetirizine (ZYRTEC) 10 MG tablet Take 10 mg by mouth daily.    . cyclobenzaprine (FLEXERIL) 10 MG tablet Take 1 tablet (10 mg total) by mouth every 8 (eight) hours as needed for muscle spasms. 30 tablet 1  . ibuprofen (ADVIL,MOTRIN) 800 MG tablet Take 1 tablet (800 mg total) by mouth every 8 (eight) hours as needed. 30 tablet 0  . Multiple Vitamin (MULTIVITAMIN) capsule Take 1 capsule by mouth daily.       No current facility-administered medications for this visit.     Allergies  Allergen Reactions  . Imitrex [Sumatriptan Succinate] Rash and Other (See Comments)    FEELS LIKE SKIN IS BURNING  . Bupropion Other (See Comments)    CAUSED A PANIC ATTACK    Family History  Problem Relation Age of Onset  . Breast cancer Maternal Aunt   . Breast cancer Maternal Grandmother   . Heart  disease Maternal Grandmother   . Hypertension Maternal Grandmother   . Cancer Paternal Grandmother     ovarian/uterine? unsure    Social History   Social History  . Marital status: Legally Separated    Spouse name: N/A  . Number of children: N/A  . Years of education: N/A   Occupational History  . Not on file.   Social History Main Topics  . Smoking status: Never Smoker  . Smokeless tobacco: Never Used  . Alcohol use Yes     Comment: occasionally  . Drug use: No  . Sexual activity: Yes    Birth control/ protection: Surgical   Other Topics Concern  . Not on file   Social History Narrative  . No narrative on file     Constitutional: Denies fever, malaise, fatigue, headache or abrupt weight changes.  HEENT: Denies eye pain, eye redness, ear pain, ringing in the ears, wax buildup, runny nose, nasal congestion, bloody nose, or sore throat. Respiratory: Denies difficulty breathing, shortness of breath, cough or sputum production.   Cardiovascular: Denies chest pain, chest tightness, palpitations or swelling in the hands or feet.  Gastrointestinal: Denies abdominal pain, bloating, constipation, diarrhea or blood in the stool.  GU: Denies urgency, frequency, pain with urination, burning sensation, blood in urine, odor  or discharge. Musculoskeletal: Denies decrease in range of motion, difficulty with gait, muscle pain or joint pain and swelling.  Skin: Denies redness, rashes, lesions or ulcercations.  Neurological: Denies dizziness, difficulty with memory, difficulty with speech or problems with balance and coordination.  Psych: Pt has history of depression, none recently. Denies anxiety, SI/HI.  No other specific complaints in a complete review of systems (except as listed in HPI above).  Objective:   Physical Exam BP 102/70   Pulse 70   Temp 98.1 F (36.7 C) (Oral)   Ht 5\' 2"  (1.575 m)   Wt 235 lb (106.6 kg)   LMP 06/18/2016   SpO2 98%   BMI 42.98 kg/m  Wt Readings  from Last 3 Encounters:  06/27/16 235 lb (106.6 kg)  01/16/16 235 lb (106.6 kg)  07/24/15 250 lb (113.4 kg)    General: Appears her stated age, obese in NAD. Skin: Warm, dry and intact.  HEENT: Head: normal shape and size; Eyes: sclera white, no icterus, conjunctiva pink, PERRLA and EOMs intact; Ears: Tm's gray and intact, normal light reflex; Throat/Mouth: Teeth present, mucosa pink and moist, no exudate, lesions or ulcerations noted.  Neck:  Neck supple, trachea midline. No masses, lumps or thyromegaly present.  Cardiovascular: Normal rate and rhythm. S1,S2 noted.  No murmur, rubs or gallops noted. No JVD or BLE edema.  Pulmonary/Chest: Normal effort and positive vesicular breath sounds. No respiratory distress. No wheezes, rales or ronchi noted.  Abdomen: Soft and nontender. Normal bowel sounds. No distention or masses noted. Liver, spleen and kidneys non palpable. Musculoskeletal: Normal range of motion. No signs of joint swelling. No difficulty with gait.  Neurological: Alert and oriented. Cranial nerves II-XII grossly intact. Coordination normal.  Psychiatric: Mood and affect normal. Behavior is normal. Judgment and thought content normal.     BMET    Component Value Date/Time   NA 139 10/22/2011 1515   NA 139 10/22/2011 1515   K 4.0 10/22/2011 1515   K 4.0 10/22/2011 1515   CL 107 10/22/2011 1515   CL 107 10/22/2011 1515   CO2 27 10/22/2011 1515   CO2 27 10/22/2011 1515   GLUCOSE 85 10/22/2011 1515   BUN 14 10/22/2011 1515   BUN 14 10/22/2011 1515   CREATININE 0.86 10/22/2011 1515   CREATININE 0.86 10/22/2011 1515   CALCIUM 8.9 10/22/2011 1515   CALCIUM 8.9 10/22/2011 1515   GFRNONAA >60 10/22/2011 1515   GFRAA >60 10/22/2011 1515    Lipid Panel     Component Value Date/Time   CHOL 202 (A) 03/29/2015   TRIG 220 (A) 03/29/2015   HDL 37 03/29/2015   LDLCALC 121 03/29/2015    CBC    Component Value Date/Time   WBC 8.8 10/22/2011 1515   WBC 8.8 10/22/2011 1515    RBC 4.54 10/22/2011 1515   RBC 4.93 12/25/2008 2037   HGB 13.0 10/22/2011 1515   HGB 13.0 10/22/2011 1515   HCT 39 10/22/2011 1515   HCT 38.5 10/22/2011 1515   PLT 273 10/22/2011 1515   PLT 273 10/22/2011 1515   MCV 85 10/22/2011 1515   MCH 28.6 10/22/2011 1515   MCHC 33.7 10/22/2011 1515   MCHC 31.7 12/25/2008 2037   RDW 13.6 10/22/2011 1515   LYMPHSABS 1.7 12/25/2008 2037   MONOABS 1.1 (H) 12/25/2008 2037   EOSABS 0.4 12/25/2008 2037   BASOSABS 0.0 12/25/2008 2037    Hgb A1C Lab Results  Component Value Date   HGBA1C 5.5 03/29/2015  Assessment & Plan:   Preventative Health Maintenance:  Flu shot today Tetanus UTD Pap smear and mammogram scheduled Encouraged her to consume a balanced diet and exercise regimen Advised her to see a dentist at least annually Will check CBC, CMET, Lipid, AIC, HIV and Hep C  RTC in 1 year, sooner if needed Webb Silversmith, NP

## 2016-06-28 LAB — COMPREHENSIVE METABOLIC PANEL
ALT: 21 IU/L (ref 0–32)
AST: 16 IU/L (ref 0–40)
Albumin/Globulin Ratio: 1.5 (ref 1.2–2.2)
Albumin: 4.1 g/dL (ref 3.5–5.5)
Alkaline Phosphatase: 76 IU/L (ref 39–117)
BUN/Creatinine Ratio: 17 (ref 9–23)
BUN: 13 mg/dL (ref 6–20)
Bilirubin Total: 0.2 mg/dL (ref 0.0–1.2)
CO2: 25 mmol/L (ref 18–29)
Calcium: 9.4 mg/dL (ref 8.7–10.2)
Chloride: 101 mmol/L (ref 96–106)
Creatinine, Ser: 0.75 mg/dL (ref 0.57–1.00)
GFR calc Af Amer: 121 mL/min/{1.73_m2} (ref >59)
GFR calc non Af Amer: 105 mL/min/{1.73_m2} (ref >59)
Globulin, Total: 2.8 g/dL (ref 1.5–4.5)
Glucose: 86 mg/dL (ref 65–99)
Potassium: 5.3 mmol/L — ABNORMAL HIGH (ref 3.5–5.2)
Sodium: 142 mmol/L (ref 134–144)
Total Protein: 6.9 g/dL (ref 6.0–8.5)

## 2016-06-28 LAB — CBC
Hematocrit: 41.5 % (ref 34.0–46.6)
Hemoglobin: 13.6 g/dL (ref 11.1–15.9)
MCH: 27.9 pg (ref 26.6–33.0)
MCHC: 32.8 g/dL (ref 31.5–35.7)
MCV: 85 fL (ref 79–97)
Platelets: 358 10*3/uL (ref 150–379)
RBC: 4.88 x10E6/uL (ref 3.77–5.28)
RDW: 14 % (ref 12.3–15.4)
WBC: 8.2 10*3/uL (ref 3.4–10.8)

## 2016-06-28 LAB — RPR: RPR Ser Ql: NONREACTIVE

## 2016-06-28 LAB — LIPID PANEL
Chol/HDL Ratio: 5.9 ratio units — ABNORMAL HIGH (ref 0.0–4.4)
Cholesterol, Total: 217 mg/dL — ABNORMAL HIGH (ref 100–199)
HDL: 37 mg/dL — ABNORMAL LOW (ref >39)
LDL Calculated: 125 mg/dL — ABNORMAL HIGH (ref 0–99)
Triglycerides: 276 mg/dL — ABNORMAL HIGH (ref 0–149)
VLDL Cholesterol Cal: 55 mg/dL — ABNORMAL HIGH (ref 5–40)

## 2016-06-28 LAB — HEMOGLOBIN A1C
Est. average glucose Bld gHb Est-mCnc: 100 mg/dL
Hgb A1c MFr Bld: 5.1 % (ref 4.8–5.6)

## 2016-06-28 LAB — HIV ANTIBODY (ROUTINE TESTING W REFLEX): HIV Screen 4th Generation wRfx: NONREACTIVE

## 2016-07-05 ENCOUNTER — Emergency Department: Payer: 59

## 2016-07-05 ENCOUNTER — Emergency Department
Admission: EM | Admit: 2016-07-05 | Discharge: 2016-07-05 | Disposition: A | Discharge status: Home / Self Care | Payer: 59 | Attending: Emergency Medicine | Admitting: Emergency Medicine

## 2016-07-05 DIAGNOSIS — Z791 Long term (current) use of non-steroidal anti-inflammatories (NSAID): Secondary | ICD-10-CM | POA: Diagnosis not present

## 2016-07-05 DIAGNOSIS — R51 Headache: Secondary | ICD-10-CM | POA: Insufficient documentation

## 2016-07-05 DIAGNOSIS — M25512 Pain in left shoulder: Secondary | ICD-10-CM

## 2016-07-05 DIAGNOSIS — W06XXXA Fall from bed, initial encounter: Secondary | ICD-10-CM | POA: Insufficient documentation

## 2016-07-05 DIAGNOSIS — Y939 Activity, unspecified: Secondary | ICD-10-CM | POA: Insufficient documentation

## 2016-07-05 DIAGNOSIS — R0789 Other chest pain: Secondary | ICD-10-CM | POA: Diagnosis present

## 2016-07-05 DIAGNOSIS — Z79899 Other long term (current) drug therapy: Secondary | ICD-10-CM | POA: Diagnosis not present

## 2016-07-05 DIAGNOSIS — Y999 Unspecified external cause status: Secondary | ICD-10-CM | POA: Diagnosis not present

## 2016-07-05 DIAGNOSIS — J45909 Unspecified asthma, uncomplicated: Secondary | ICD-10-CM | POA: Diagnosis not present

## 2016-07-05 DIAGNOSIS — Y929 Unspecified place or not applicable: Secondary | ICD-10-CM | POA: Insufficient documentation

## 2016-07-05 LAB — CBC
HCT: 41.7 % (ref 35.0–47.0)
Hemoglobin: 14.3 g/dL (ref 12.0–16.0)
MCH: 28.7 pg (ref 26.0–34.0)
MCHC: 34.3 g/dL (ref 32.0–36.0)
MCV: 83.9 fL (ref 80.0–100.0)
Platelets: 268 10*3/uL (ref 150–440)
RBC: 4.97 MIL/uL (ref 3.80–5.20)
RDW: 13.8 % (ref 11.5–14.5)
WBC: 10.3 10*3/uL (ref 3.6–11.0)

## 2016-07-05 LAB — BASIC METABOLIC PANEL
Anion gap: 8 (ref 5–15)
BUN: 15 mg/dL (ref 6–20)
CO2: 26 mmol/L (ref 22–32)
Calcium: 9.1 mg/dL (ref 8.9–10.3)
Chloride: 104 mmol/L (ref 101–111)
Creatinine, Ser: 0.79 mg/dL (ref 0.44–1.00)
GFR calc Af Amer: >60 mL/min (ref >60)
GFR calc non Af Amer: >60 mL/min (ref >60)
Glucose, Bld: 96 mg/dL (ref 65–99)
Potassium: 3.9 mmol/L (ref 3.5–5.1)
Sodium: 138 mmol/L (ref 135–145)

## 2016-07-05 LAB — TROPONIN I: Troponin I: <0.03 ng/mL (ref <0.03)

## 2016-07-05 MED ORDER — IBUPROFEN 600 MG PO TABS
600.0000 mg | ORAL_TABLET | Freq: Once | ORAL | Status: AC
  Administered 2016-07-05: 600 mg via ORAL
  Filled 2016-07-05: qty 1

## 2016-07-05 NOTE — ED Triage Notes (Signed)
Pt presents to ED after she woke up approx 3 hours ago with headache, sharp left sided chest pain that radiates into her neck, and left arm numbness. Denies similar symptoms previously. Pt alert and calm at this time. No increased work of breathing or acute distress noted. Skin warm and dry.

## 2016-07-05 NOTE — ED Provider Notes (Signed)
[facility] Emergency Department Provider Note  ____________________________________________   I have reviewed the triage vital signs and the nursing notes.   HISTORY  Chief Complaint Headache; Chest Pain; and Numbness    HPI Deanna Thomas Seen is a 35 y.o. female with a history of multiple different chronic pain syndromes including headache states that she is here not because she has a headache but because when she rolled over in bed last night she landed on for the on her shoulder and felt that "every bone and muscle in my entire left side is broken". Patient denies any other trauma she denies any repetitive motions at left arm she denies cough or shortness of breath she denies pleuritic chest pain she actually denies any numbness to me, she states it just "hurts". Reassuringly, patient has had 5 negative CT scans of her head including for "arm numbness" in the past, as well as a recent negative MRI of her head and a negative CT scan of her chest in the past as well. In any event, patient feels that she is not having any other significant pathology. She states that it is just uncomfortable to touch or move her left chest wall or left shoulder. This is after apparently rolling over on it. She denies domestic abuse, she denies any other positional aspect of her pain she denies any chest pain or shortness of breath. she denies pregnancy       Past Medical History:  Diagnosis Date  . Allergy   . Asthma   . Complication of anesthesia    hard to wake up post-op  . 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 Depression   . Difficulty swallowing pills   . Migraine   . Multiple body piercings    nose and both ears - unsure if she can remove for surgery    Patient Active Problem List   Diagnosis Date Noted  . Plantar fasciitis, bilateral 04/17/2015  . Depression, major, single episode 10/22/2011  . Obesity 06/03/2011  . CARPAL TUNNEL SYNDROME,  BILATERAL 07/17/2008  . Migraine 11/05/2006  . Asthma 11/05/2006    Past Surgical History:  Procedure Laterality Date  . ABDOMINAL SURGERY     for ectopic pregnancy  . CESAREAN SECTION  02/28/3005; 01/04/2008  . ECTOPIC PREGNANCY SURGERY Left 01/28/2006   mini laparotomy; partial left salpingectomy  . MASS EXCISION Right 08/01/2013   Procedure: EXCISION MASS RIGHT LONG FINGER;  Surgeon: Tennis Must, MD;  Location: [place];  Service: Orthopedics;  Laterality: Right;  . TUBAL LIGATION Right 01/04/2008    Prior to Admission medications   Medication Sig Start Date End Date Taking? Authorizing Provider  albuterol (PROVENTIL HFA;VENTOLIN HFA) 108 (90 BASE) MCG/ACT inhaler Inhale 2 puffs into the lungs every 6 (six) hours as needed for wheezing or shortness of breath. 07/15/13   Bernadene Bell, MD  cetirizine (ZYRTEC) 10 MG tablet Take 10 mg by mouth daily.    Historical Provider, MD  cyclobenzaprine (FLEXERIL) 10 MG tablet Take 1 tablet (10 mg total) by mouth every 8 (eight) hours as needed for muscle spasms. 07/24/15   Pierce Crane Beers, PA-C  ibuprofen (ADVIL,MOTRIN) 800 MG tablet Take 1 tablet (800 mg total) by mouth every 8 (eight) hours as needed. 07/24/15   Arlyss Repress, PA-C  Multiple Vitamin (MULTIVITAMIN) capsule Take 1 capsule by mouth daily.      Historical Provider, MD    Allergies Imitrex [  sumatriptan succinate] and Bupropion  Family History  Problem Relation Age of Onset  . Breast cancer Maternal Aunt   . Breast cancer Maternal Grandmother   . Heart disease Maternal Grandmother   . Hypertension Maternal Grandmother   . Cancer Paternal Grandmother     ovarian/uterine? unsure    Social History Social History  Substance Use Topics  . Smoking status: Never Smoker  . Smokeless tobacco: Never Used  . Alcohol use Yes     Comment: occasionally    Review of Systems Constitutional: No fever/chills Eyes: No visual changes. ENT: No sore throat. No stiff  neck no neck pain Cardiovascular: Denies chest pain. Respiratory: Denies shortness of breath. Gastrointestinal:   no vomiting.  No diarrhea.  No constipation. Genitourinary: Negative for dysuria. Musculoskeletal: Negative lower extremity swelling Skin: Negative for rash. Neurological: Negative for severe headaches, focal weakness or numbness. 10-point ROS otherwise negative.  ____________________________________________   PHYSICAL EXAM:  VITAL SIGNS: ED Triage Vitals  Enc Vitals Group     BP 07/05/16 0524 (!) 114/58     Pulse Rate 07/05/16 0524 72     Resp 07/05/16 0524 18     Temp 07/05/16 0524 97.9 F (36.6 C)     Temp Source 07/05/16 0524 Oral     SpO2 07/05/16 0524 98 %     Weight 07/05/16 0525 235 lb (106.6 kg)     Height 07/05/16 0525 5\' 2"  (1.575 m)     Head Circumference --      Peak Flow --      Pain Score 07/05/16 0525 8     Pain Loc --      Pain Edu? --      Excl. in [place]? --     Constitutional: Alert and oriented. Well appearing and in no acute distress. Eyes: Conjunctivae are normal. PERRL. EOMI. Head: Atraumatic. Nose: No congestion/rhinnorhea. Mouth/Throat: Mucous membranes are moist.  Oropharynx non-erythematous. Neck: No stridor.   Nontender with no meningismus Chest: Tender to palpation the pectoralis muscle but the left breast which reproduces her pain. Also reproducing her pain is movement of the left arm. There is no bony fracture noted to the clavicle. When I touch this area patient states "ouch that's the pain right there" and pulls back. The patient has no evidence of herpetic lesions, there is no cellulitic changes skin is warm and dry. The joint itself is not red or hot to touch. Cardiovascular: Normal rate, regular rhythm. Grossly normal heart sounds.  Good peripheral circulation. Respiratory: Normal respiratory effort.  No retractions. Lungs CTAB. Abdominal: Soft and nontender. No distention. No guarding no rebound Back:  There is no focal  tenderness or step off.  there is no midline tenderness there are no lesions noted. there is no CVA tenderness Musculoskeletal: No lower extremity tenderness, compartment is soft in the upper extremity sensation is normal, she has tenderness to palpation, minimally, and the left shoulder worse when she raises her arm above her head but she has full range of motion.. No joint effusions, no DVT signs strong distal pulses no edema Neurologic:  Normal speech and language. No gross focal neurologic deficits are appreciated.  Skin:  Skin is warm, dry and intact. No rash noted. Psychiatric: Mood and affect are normal. Speech and behavior are normal.  ____________________________________________   LABS (all labs ordered are listed, but only abnormal results are displayed)  Labs Reviewed  BASIC METABOLIC PANEL  CBC  TROPONIN I   ____________________________________________  EKG  I  personally interpreted any EKGs ordered by me or triage Normal sinus rhythm rate 66 bpm no acute ST elevation or acute ST depression normal axis no acute ischemia ____________________________________________  RADIOLOGY  I reviewed any imaging ordered by me or triage that were performed during my shift and, if possible, patient and/or family made aware of any abnormal findings. ____________________________________________   PROCEDURES  Procedure(s) performed: None  Procedures  Critical Care performed: None  ____________________________________________   INITIAL IMPRESSION / ASSESSMENT AND PLAN / ED COURSE  Pertinent labs & imaging results that were available during my care of the patient were reviewed by me and considered in my medical decision making (see chart for details).  35 year old patient who unfortunately has had multiple different negative CT scans in her life presents with very reproducible chest wall and shoulder pain after "rolling over wrong".  Clinical Course    ____________________________________________   FINAL CLINICAL IMPRESSION(S) / ED DIAGNOSES  Final diagnoses:  None      This chart was dictated using voice recognition software.  Despite best efforts to proofread,  errors can occur which can change meaning.      Schuyler Amor, MD 07/05/16 [phone]

## 2016-07-14 ENCOUNTER — Telehealth: Payer: Self-pay | Admitting: Internal Medicine

## 2016-07-14 NOTE — Telephone Encounter (Signed)
Pt called about labs  Please call back at [phone] Thanks

## 2016-07-16 NOTE — Telephone Encounter (Signed)
Her labs were released to mychart on 10/23 with comments:  Your blood counts look good. Potassium is a little bit high. Try not to consume a lot of potassium rich foods like bananas or green leafy vegetables over the next week. Liver and kidneys functions are normal. A1C for blood sugar looks good. Cholesterol is slightly elevated but triglycerides are high. Consume a low fat diet and increase aerobic exercise. Taking fish oil 1000 mg 3 times a day with meals can also help bring the triglycerides down. HIV and Hep C are both negative.

## 2016-07-17 NOTE — Telephone Encounter (Signed)
Patient is aware of lab results and explained to pt results had been released via mychart so that is why she did not receive a call, she expressed understanding

## 2016-07-25 ENCOUNTER — Other Ambulatory Visit: Payer: Self-pay | Admitting: Obstetrics and Gynecology

## 2016-09-22 NOTE — Addendum Note (Signed)
Addended by: Lurlean Nanny on: 09/22/2016 01:32 PM   Modules accepted: Orders

## 2016-11-10 ENCOUNTER — Encounter: Payer: Self-pay | Admitting: Primary Care

## 2016-11-10 ENCOUNTER — Ambulatory Visit: Payer: 59 | Admitting: Family Medicine

## 2016-11-10 ENCOUNTER — Ambulatory Visit (INDEPENDENT_AMBULATORY_CARE_PROVIDER_SITE_OTHER): Payer: 59 | Admitting: Primary Care

## 2016-11-10 VITALS — BP 126/78 | HR 101 | Temp 99.9°F | Ht 62.0 in | Wt 237.4 lb

## 2016-11-10 DIAGNOSIS — J101 Influenza due to other identified influenza virus with other respiratory manifestations: Secondary | ICD-10-CM

## 2016-11-10 LAB — POC INFLUENZA A&B (BINAX/QUICKVUE)
Influenza A, POC: POSITIVE — AB
Influenza B, POC: NEGATIVE

## 2016-11-10 MED ORDER — OSELTAMIVIR PHOSPHATE 75 MG PO CAPS: 75.0000 mg | ORAL_CAPSULE | Freq: Two times a day (BID) | ORAL | 0 refills | Status: DC

## 2016-11-10 NOTE — Progress Notes (Signed)
Subjective:    [name], female    DOB: 1981-08-01, 36 y.o.   MRN: [id number]  HPI  Deanna Thomas is a 36 year old female with a history of asthma who presents today with a chief complaint of cough. She also reports nasal congestion, chills, fatigue, shortness of breath, fever. Her symptoms began yesterday. Her fever this morning was 102 this morning. She used her albuterol inhaler 3-4 times last night. She's taken Ibuprofen and Nyquil with some improvement in fevres. She did have her flu shot this season. She has been around several people at work who have been diagnosed with influenza.   Review of Systems  Constitutional: Positive for chills, fatigue and fever.  HENT: Positive for congestion. Negative for ear pain, sinus pressure and sore throat.   Respiratory: Positive for cough and shortness of breath. Negative for wheezing.   Cardiovascular: Negative for chest pain.  Musculoskeletal: Positive for myalgias.       Past Medical History:  Diagnosis Date  . Allergy   . Asthma   . Complication of anesthesia    hard to wake up post-op  . 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 Depression   . Difficulty swallowing pills   . Migraine   . Multiple body piercings    nose and both ears - unsure if she can remove for surgery     Social History   Social History  . Marital status: Legally Separated    Spouse name: N/A  . Number of children: N/A  . Years of education: N/A   Occupational History  . Not on file.   Social History Main Topics  . Smoking status: Never Smoker  . Smokeless tobacco: Never Used  . Alcohol use Yes     Comment: occasionally  . Drug use: No  . Sexual activity: Yes    Birth control/ protection: Surgical   Other Topics Concern  . Not on file   Social History Narrative  . No narrative on file    Past Surgical History:  Procedure Laterality Date  . ABDOMINAL SURGERY     for ectopic pregnancy  . CESAREAN  SECTION  02/28/3005; 01/04/2008  . ECTOPIC PREGNANCY SURGERY Left 01/28/2006   mini laparotomy; partial left salpingectomy  . MASS EXCISION Right 08/01/2013   Procedure: EXCISION MASS RIGHT LONG FINGER;  Surgeon: Tennis Must, MD;  Location: [place];  Service: Orthopedics;  Laterality: Right;  . TUBAL LIGATION Right 01/04/2008    Family History  Problem Relation Age of Onset  . Breast cancer Maternal Aunt   . Breast cancer Maternal Grandmother   . Heart disease Maternal Grandmother   . Hypertension Maternal Grandmother   . Cancer Paternal Grandmother     ovarian/uterine? unsure    Allergies  Allergen Reactions  . Imitrex [Sumatriptan Succinate] Rash and Other (See Comments)    FEELS LIKE SKIN IS BURNING  . Bupropion Other (See Comments)    CAUSED A PANIC ATTACK    Current Outpatient Prescriptions on File Prior to Visit  Medication Sig Dispense Refill  . albuterol (PROVENTIL HFA;VENTOLIN HFA) 108 (90 BASE) MCG/ACT inhaler Inhale 2 puffs into the lungs every 6 (six) hours as needed for wheezing or shortness of breath. 3 Inhaler 11  . ibuprofen (ADVIL,MOTRIN) 800 MG tablet Take 1 tablet (800 mg total) by mouth every 8 (eight) hours as needed. 30 tablet 0  . cetirizine (ZYRTEC) 10 MG tablet  Take 10 mg by mouth daily.    . cyclobenzaprine (FLEXERIL) 10 MG tablet Take 1 tablet (10 mg total) by mouth every 8 (eight) hours as needed for muscle spasms. (Patient not taking: Reported on 11/10/2016) 30 tablet 1  . Multiple Vitamin (MULTIVITAMIN) capsule Take 1 capsule by mouth daily.       No current facility-administered medications on file prior to visit.     BP 126/78   Pulse (!) 101   Temp 99.9 F (37.7 C) (Oral)   Ht 5\' 2"  (1.575 m)   Wt 237 lb 6.4 oz (107.7 kg)   LMP 11/08/2016   SpO2 96%   BMI 43.42 kg/m    Objective:   Physical Exam  Constitutional: She appears well-nourished. She appears ill.  HENT:  Right Ear: Tympanic membrane and ear canal normal.    Left Ear: Tympanic membrane and ear canal normal.  Nose: Mucosal edema present. Right sinus exhibits no maxillary sinus tenderness and no frontal sinus tenderness. Left sinus exhibits no maxillary sinus tenderness and no frontal sinus tenderness.  Mouth/Throat: Oropharynx is clear and moist.  Eyes: Conjunctivae are normal.  Neck: Neck supple.  Cardiovascular: Normal rate and regular rhythm.   Pulmonary/Chest: Effort normal and breath sounds normal. She has no wheezes. She has no rales.  Lymphadenopathy:    She has no cervical adenopathy.  Skin: Skin is warm and dry.          Assessment & Plan:  URI:  Cough, congestion, fatigue, body aches, fevers x 24 hours. Did have influenza vaccination. Numerous recent exposures. Exam today with clear lungs, no respiratory distress, appears ill. Rapid Flu: Positive for A. Tamiflu course sent to pharmacy. Continue use of albuterol inhaler.   Fluids, rest, follow up PRN. Work note provided.  Sheral Flow, NP

## 2016-11-10 NOTE — Patient Instructions (Signed)
Start Tamiflu 75 mg capsules twice daily for the next 5 days.  Continue use of your albuterol inhaler as needed for shortness of breath/wheezing.   Please call me if your breathing gets worse.  Cough/Congestion: Try Delsym DM or Robitussin DM. These may be purchased OTC.  It was a pleasure meeting you!

## 2016-11-10 NOTE — Addendum Note (Signed)
Addended by: Jacqualin Combes on: 11/10/2016 03:18 PM   Modules accepted: Orders

## 2016-11-10 NOTE — Progress Notes (Signed)
Pre visit review using our clinic review tool, if applicable. No additional management support is needed unless otherwise documented below in the visit note. 

## 2017-06-02 ENCOUNTER — Other Ambulatory Visit: Payer: Self-pay | Admitting: Obstetrics and Gynecology

## 2017-06-02 DIAGNOSIS — N6310 Unspecified lump in the right breast, unspecified quadrant: Secondary | ICD-10-CM

## 2017-06-02 DIAGNOSIS — N6320 Unspecified lump in the left breast, unspecified quadrant: Principal | ICD-10-CM

## 2017-06-25 ENCOUNTER — Encounter: Payer: 59 | Admitting: Internal Medicine

## 2017-07-28 DIAGNOSIS — Z01419 Encounter for gynecological examination (general) (routine) without abnormal findings: Secondary | ICD-10-CM | POA: Diagnosis not present

## 2017-07-28 DIAGNOSIS — Z6841 Body Mass Index (BMI) 40.0 and over, adult: Secondary | ICD-10-CM | POA: Diagnosis not present

## 2017-08-05 ENCOUNTER — Encounter: Payer: Self-pay | Admitting: Internal Medicine

## 2017-12-01 ENCOUNTER — Encounter: Payer: Self-pay | Admitting: Internal Medicine

## 2017-12-01 ENCOUNTER — Ambulatory Visit (INDEPENDENT_AMBULATORY_CARE_PROVIDER_SITE_OTHER): Payer: BLUE CROSS/BLUE SHIELD | Admitting: Internal Medicine

## 2017-12-01 VITALS — BP 112/80 | HR 72 | Temp 98.1°F | Ht 62.0 in | Wt 244.0 lb

## 2017-12-01 DIAGNOSIS — M25552 Pain in left hip: Secondary | ICD-10-CM

## 2017-12-01 DIAGNOSIS — M25562 Pain in left knee: Secondary | ICD-10-CM | POA: Diagnosis not present

## 2017-12-01 MED ORDER — PREDNISONE 10 MG PO TABS: ORAL_TABLET | ORAL | 0 refills | Status: DC

## 2017-12-01 MED ORDER — METHOCARBAMOL 500 MG PO TABS: 500.0000 mg | ORAL_TABLET | Freq: Three times a day (TID) | ORAL | 0 refills | Status: DC | PRN

## 2017-12-01 NOTE — Progress Notes (Addendum)
Subjective:    [name], female    DOB: 02/25/1981, 37 y.o.   MRN: [id number]  HPI  Pt presents to the clinic today with c/o left hip pain. She reports this started 2-3 months ago. She describes the pain as achy. She reports it feels like it needs to pop or it is going to lock up on her. Over the last month, she has noticed some numbness and tingling that radiates down to her knee. The pain, numbness and tingling are worse with laying down for long periods of time, or walking up stairs. She did have a fall 4 months ago, but does not recall having this hip pain immediately after that. She has tried Ibuprofen and stretching with some relief.  Review of Systems      Past Medical History:  Diagnosis Date  . Allergy   . Asthma   . Complication of anesthesia    hard to wake up post-op  . 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 Depression   . Difficulty swallowing pills   . Migraine   . Multiple body piercings    nose and both ears - unsure if she can remove for surgery    Current Outpatient Medications  Medication Sig Dispense Refill  . cetirizine (ZYRTEC) 10 MG tablet Take 10 mg by mouth daily.     No current facility-administered medications for this visit.     Allergies  Allergen Reactions  . Imitrex [Sumatriptan Succinate] Rash and Other (See Comments)    FEELS LIKE SKIN IS BURNING  . Bupropion Other (See Comments)    CAUSED A PANIC ATTACK    Family History  Problem Relation Age of Onset  . Breast cancer Maternal Aunt   . Breast cancer Maternal Grandmother   . Heart disease Maternal Grandmother   . Hypertension Maternal Grandmother   . Cancer Paternal Grandmother        ovarian/uterine? unsure    Social History   Socioeconomic History  . Marital status: Legally Separated    Spouse name: Not on file  . Number of children: Not on file  . Years of education: Not on file  . Highest education level: Not on file    Occupational History  . Not on file  Social Needs  . Financial resource strain: Not on file  . Food insecurity:    Worry: Not on file    Inability: Not on file  . Transportation needs:    Medical: Not on file    Non-medical: Not on file  Tobacco Use  . Smoking status: Never Smoker  . Smokeless tobacco: Never Used  Substance and Sexual Activity  . Alcohol use: Yes    Comment: occasionally  . Drug use: No  . Sexual activity: Yes    Birth control/protection: Surgical  Lifestyle  . Physical activity:    Days per week: Not on file    Minutes per session: Not on file  . Stress: Not on file  Relationships  . Social connections:    Talks on phone: Not on file    Gets together: Not on file    Attends religious service: Not on file    Active member of club or organization: Not on file    Attends meetings of clubs or organizations: Not on file    Relationship status: Not on file  . Intimate partner violence:    Fear of current or ex partner: Not on  file    Emotionally abused: Not on file    Physically abused: Not on file    Forced sexual activity: Not on file  Other Topics Concern  . Not on file  Social History Narrative  . Not on file     Constitutional: Denies fever, malaise, fatigue, headache or abrupt weight changes.  Musculoskeletal: Pt reports left hip pain. Denies decrease in range of motion, difficulty with gait, muscle pain or joint swelling.    No other specific complaints in a complete review of systems (except as listed in HPI above).  Objective:   Physical Exam    BP 112/80   Pulse 72   Temp 98.1 F (36.7 C) (Oral)   Wt 244 lb (110.7 kg)   LMP 11/09/2017   SpO2 98%   BMI 44.63 kg/m   Wt Readings from Last 3 Encounters:  12/01/17 244 lb (110.7 kg)  11/10/16 237 lb 6.4 oz (107.7 kg)  07/05/16 235 lb (106.6 kg)    General: Appears her stated age, well developed, well nourished in NAD. Musculoskeletal: Normal flexion, extension, rotation and  lateral bending of the spine. Bony tenderness noted over the lumbar spine and left SI joint. No pain with palpation of the left hip. Normal flexion, extension and adduction of the left hip. Pain with adduction. Normal internal rotation of the left hip. Pain with external rotation. Normal flexion, extension of the knee. No joint swelling noted. Pain with palpation over the medial and lateral joint line. Strength 4/5 LLE, 5/5 RLE. She is able to walk on toes, with pain. She is able to walk on heels. Her gait is normal. Neurological: Positive SLR on the left. Sensation intact to BLE.   BMET    Component Value Date/Time   NA 138 07/05/2016 0535   NA 142 06/27/2016 1412   NA 139 10/22/2011 1515   K 3.9 07/05/2016 0535   K 4.0 10/22/2011 1515   K 4.0 10/22/2011 1515   CL 104 07/05/2016 0535   CL 107 10/22/2011 1515   CL 107 10/22/2011 1515   CO2 26 07/05/2016 0535   CO2 27 10/22/2011 1515   CO2 27 10/22/2011 1515   GLUCOSE 96 07/05/2016 0535   GLUCOSE 85 10/22/2011 1515   BUN 15 07/05/2016 0535   BUN 13 06/27/2016 1412   BUN 14 10/22/2011 1515   CREATININE 0.79 07/05/2016 0535   CREATININE 0.86 10/22/2011 1515   CREATININE 0.86 10/22/2011 1515   CALCIUM 9.1 07/05/2016 0535   CALCIUM 8.9 10/22/2011 1515   CALCIUM 8.9 10/22/2011 1515   GFRNONAA >60 07/05/2016 0535   GFRNONAA >60 10/22/2011 1515   GFRAA >60 07/05/2016 0535   GFRAA >60 10/22/2011 1515    Lipid Panel     Component Value Date/Time   CHOL 217 (H) 06/27/2016 1412   TRIG 276 (H) 06/27/2016 1412   HDL 37 (L) 06/27/2016 1412   CHOLHDL 5.9 (H) 06/27/2016 1412   LDLCALC 125 (H) 06/27/2016 1412    CBC    Component Value Date/Time   WBC 10.3 07/05/2016 0535   RBC 4.97 07/05/2016 0535   HGB 14.3 07/05/2016 0535   HGB 13.6 06/27/2016 1412   HCT 41.7 07/05/2016 0535   HCT 41.5 06/27/2016 1412   HCT 39 10/22/2011 1515   PLT 268 07/05/2016 0535   PLT 358 06/27/2016 1412   MCV 83.9 07/05/2016 0535   MCV 85 06/27/2016  1412   MCV 85 10/22/2011 1515   MCH 28.7 07/05/2016 0535  MCHC 34.3 07/05/2016 0535   RDW 13.8 07/05/2016 0535   RDW 14.0 06/27/2016 1412   RDW 13.6 10/22/2011 1515   LYMPHSABS 1.7 12/25/2008 2037   MONOABS 1.1 (H) 12/25/2008 2037   EOSABS 0.4 12/25/2008 2037   BASOSABS 0.0 12/25/2008 2037    Hgb A1C Lab Results  Component Value Date   HGBA1C 5.1 06/27/2016          Assessment & Plan:   Left Hip Pain, Left Knee Pain:  I think this is actually sciatica We discussed treatment options but would like to start with conservative treatment Stretching exercises given eRx for Pred Taper x 9 days eRx for Robaxin 500 mg TID prn- sedation caution given If worsens, she wants to consider chiropractic care vs xrays/PT  Return precautions discussed Webb Silversmith, NP

## 2017-12-01 NOTE — Patient Instructions (Signed)
Sciatica Rehab  Ask your health care provider which exercises are safe for you. Do exercises exactly as told by your health care provider and adjust them as directed. It is normal to feel mild stretching, pulling, tightness, or discomfort as you do these exercises, but you should stop right away if you feel sudden pain or your pain gets worse. Do not begin these exercises until told by your health care provider.  Stretching and range of motion exercises  These exercises warm up your muscles and joints and improve the movement and flexibility of your hips and your back. These exercises also help to relieve pain, numbness, and tingling.  Exercise A: Sciatic nerve glide  1. Sit in a chair with your head facing down toward your chest. Place your hands behind your back. Let your shoulders slump forward.  2. Slowly straighten one of your knees while you tilt your head back as if you are looking toward the ceiling. Only straighten your leg as far as you can without making your symptoms worse.  3. Hold for __________ seconds.  4. Slowly return to the starting position.  5. Repeat with your other leg.  Repeat __________ times. Complete this exercise __________ times a day.  Exercise B: Knee to chest with hip adduction and internal rotation    1. Lie on your back on a firm surface with both legs straight.  2. Bend one of your knees and move it up toward your chest until you feel a gentle stretch in your lower back and buttock. Then, move your knee toward the shoulder that is on the opposite side from your leg.  ? Hold your leg in this position by holding onto the front of your knee.  3. Hold for __________ seconds.  4. Slowly return to the starting position.  5. Repeat with your other leg.  Repeat __________ times. Complete this exercise __________ times a day.  Exercise C: Prone extension on elbows    1. Lie on your abdomen on a firm surface. A bed may be too soft for this exercise.  2. Prop yourself up on your  elbows.  3. Use your arms to help lift your chest up until you feel a gentle stretch in your abdomen and your lower back.  ? This will place some of your body weight on your elbows. If this is uncomfortable, try stacking pillows under your chest.  ? Your hips should stay down, against the surface that you are lying on. Keep your hip and back muscles relaxed.  4. Hold for __________ seconds.  5. Slowly relax your upper body and return to the starting position.  Repeat __________ times. Complete this exercise __________ times a day.  Strengthening exercises  These exercises build strength and endurance in your back. Endurance is the ability to use your muscles for a long time, even after they get tired.  Exercise D: Pelvic tilt  1. Lie on your back on a firm surface. Bend your knees and keep your feet flat.  2. Tense your abdominal muscles. Tip your pelvis up toward the ceiling and flatten your lower back into the floor.  ? To help with this exercise, you may place a small towel under your lower back and try to push your back into the towel.  3. Hold for __________ seconds.  4. Let your muscles relax completely before you repeat this exercise.  Repeat __________ times. Complete this exercise __________ times a day.  Exercise E: Alternating arm and leg raises      1. Get on your hands and knees on a firm surface. If you are on a hard floor, you may want to use padding to cushion your knees, such as an exercise mat.  2. Line up your arms and legs. Your hands should be below your shoulders, and your knees should be below your hips.  3. Lift your left leg behind you. At the same time, raise your right arm and straighten it in front of you.  ? Do not lift your leg higher than your hip.  ? Do not lift your arm higher than your shoulder.  ? Keep your abdominal and back muscles tight.  ? Keep your hips facing the ground.  ? Do not arch your back.  ? Keep your balance carefully, and do not hold your breath.  4. Hold for  __________ seconds.  5. Slowly return to the starting position and repeat with your right leg and your left arm.  Repeat __________ times. Complete this exercise __________ times a day.  Posture and body mechanics    Body mechanics refers to the movements and positions of your body while you do your daily activities. Posture is part of body mechanics. Good posture and healthy body mechanics can help to relieve stress in your body's tissues and joints. Good posture means that your spine is in its natural S-curve position (your spine is neutral), your shoulders are pulled back slightly, and your head is not tipped forward. The following are general guidelines for applying improved posture and body mechanics to your everyday activities.  Standing    · When standing, keep your spine neutral and your feet about hip-width apart. Keep a slight bend in your knees. Your ears, shoulders, and hips should line up.  · When you do a task in which you stand in one place for a long time, place one foot up on a stable object that is 2-4 inches (5-10 cm) high, such as a footstool. This helps keep your spine neutral.  Sitting    · When sitting, keep your spine neutral and keep your feet flat on the floor. Use a footrest, if necessary, and keep your thighs parallel to the floor. Avoid rounding your shoulders, and avoid tilting your head forward.  · When working at a desk or a computer, keep your desk at a height where your hands are slightly lower than your elbows. Slide your chair under your desk so you are close enough to maintain good posture.  · When working at a computer, place your monitor at a height where you are looking straight ahead and you do not have to tilt your head forward or downward to look at the screen.  Resting    · When lying down and resting, avoid positions that are most painful for you.  · If you have pain with activities such as sitting, bending, stooping, or squatting (flexion-based activities), lie in a  position in which your body does not bend very much. For example, avoid curling up on your side with your arms and knees near your chest (fetal position).  · If you have pain with activities such as standing for a long time or reaching with your arms (extension-based activities), lie with your spine in a neutral position and bend your knees slightly. Try the following positions:  ? Lying on your side with a pillow between your knees.  ? Lying on your back with a pillow under your knees.  Lifting    · When lifting   objects, keep your feet at least shoulder-width apart and tighten your abdominal muscles.  · Bend your knees and hips and keep your spine neutral. It is important to lift using the strength of your legs, not your back. Do not lock your knees straight out.  · Always ask for help to lift heavy or awkward objects.  This information is not intended to replace advice given to you by your health care provider. Make sure you discuss any questions you have with your health care provider.  Document Released: 08/25/2005 Document Revised: 05/01/2016 Document Reviewed: 05/11/2015  Elsevier Interactive Patient Education © 2018 [employer]

## 2017-12-03 ENCOUNTER — Ambulatory Visit: Payer: Self-pay | Admitting: *Deleted

## 2017-12-03 NOTE — Telephone Encounter (Signed)
If she is worse, she needs to be reevaluated. She can only take Tylenol OTC for her headache. Does she have any abortive medication for her migraines?

## 2017-12-03 NOTE — Telephone Encounter (Signed)
See triage notes.  Reason for Disposition . Caller has URGENT medication question about med that PCP prescribed and triager unable to answer question  Answer Assessment - Initial Assessment Questions 1. SYMPTOMS: "Do you have any symptoms?" I returned her call.   She saw Webb Silversmith, FNP 12/01/17 for left hip pain.  She was prescribed Robaxin and Prednisone.   She started the Prednisone yesterday morning.  She now has a headache that Tylenol is not helping.  Regina instructed her not to use  Ibuprofen, Aleve, etc.  She usually uses Ibuprofen.   She is concerned about the headache not going away.  She gets migraines and is afraid this might go into a migraine.    Last night she took the Robaxin and it did not help her sleep like it did the night before.  She also c/o her hip hurting more than it was with a throbbing pain that is constant.   She is having lower back pain this morning that is new.         2. SEVERITY: If symptoms are present, ask "Are they mild, moderate or severe?"     She was not able to sleep last night due to the constant throbbing pain in her left hip.  Protocols used: MEDICATION QUESTION CALL-A-AH

## 2017-12-04 NOTE — Telephone Encounter (Signed)
Pt reports she actually has had some improvement in Sx and with headaches... So she will continue Rx and let us know if anything changes

## 2017-12-28 ENCOUNTER — Telehealth: Payer: Self-pay | Admitting: *Deleted

## 2017-12-28 NOTE — Telephone Encounter (Signed)
Copied from [place] [id number]. Topic: General - Other >> Dec 28, 2017 11:21 AM Carolyn Stare wrote:  Pt said she was told to call back if the below med did not work,she said she is still having the pain in her hip. Would like a call back [phone]   predniSONE (DELTASONE) 10 MG tablet

## 2017-12-28 NOTE — Telephone Encounter (Signed)
She needs a follow up appt to discuss xrays, PT etc

## 2017-12-29 NOTE — Telephone Encounter (Signed)
Called patient unable to leave voicemail, mailbox is full at this time will attempt at a later time.

## 2017-12-29 NOTE — Telephone Encounter (Signed)
° °  Spoke with pt and she has been schedule for an appt

## 2018-01-06 ENCOUNTER — Ambulatory Visit: Payer: BLUE CROSS/BLUE SHIELD | Admitting: Internal Medicine

## 2018-01-14 ENCOUNTER — Encounter: Payer: Self-pay | Admitting: Internal Medicine

## 2018-01-14 ENCOUNTER — Ambulatory Visit (INDEPENDENT_AMBULATORY_CARE_PROVIDER_SITE_OTHER): Payer: BLUE CROSS/BLUE SHIELD | Admitting: Internal Medicine

## 2018-01-14 VITALS — BP 122/80 | HR 63 | Temp 97.9°F | Wt 242.0 lb

## 2018-01-14 DIAGNOSIS — M5442 Lumbago with sciatica, left side: Secondary | ICD-10-CM

## 2018-01-14 DIAGNOSIS — M25552 Pain in left hip: Secondary | ICD-10-CM

## 2018-01-14 NOTE — Patient Instructions (Signed)
Sciatica Sciatica is pain, numbness, weakness, or tingling along your sciatic nerve. The sciatic nerve starts in the lower back and goes down the back of each leg. Sciatica happens when this nerve is pinched or has pressure put on it. Sciatica usually goes away on its own or with treatment. Sometimes, sciatica may keep coming back (recur). Follow these instructions at home: Medicines  Take over-the-counter and prescription medicines only as told by your doctor.  Do not drive or use heavy machinery while taking prescription pain medicine. Managing pain  If directed, put ice on the affected area. ? Put ice in a plastic bag. ? Place a towel between your skin and the bag. ? Leave the ice on for 20 minutes, 2-3 times a day.  After icing, apply heat to the affected area before you exercise or as often as told by your doctor. Use the heat source that your doctor tells you to use, such as a moist heat pack or a heating pad. ? Place a towel between your skin and the heat source. ? Leave the heat on for 20-30 minutes. ? Remove the heat if your skin turns bright red. This is especially important if you are unable to feel pain, heat, or cold. You may have a greater risk of getting burned. Activity  Return to your normal activities as told by your doctor. Ask your doctor what activities are safe for you. ? Avoid activities that make your sciatica worse.  Take short rests during the day. Rest in a lying or standing position. This is usually better than sitting to rest. ? When you rest for a long time, do some physical activity or stretching between periods of rest. ? Avoid sitting for a long time without moving. Get up and move around at least one time each hour.  Exercise and stretch regularly, as told by your doctor.  Do not lift anything that is heavier than 10 lb (4.5 kg) while you have symptoms of sciatica. ? Avoid lifting heavy things even when you do not have symptoms. ? Avoid lifting heavy  things over and over.  When you lift objects, always lift in a way that is safe for your body. To do this, you should: ? Bend your knees. ? Keep the object close to your body. ? Avoid twisting. General instructions  Use good posture. ? Avoid leaning forward when you are sitting. ? Avoid hunching over when you are standing.  Stay at a healthy weight.  Wear comfortable shoes that support your feet. Avoid wearing high heels.  Avoid sleeping on a mattress that is too soft or too hard. You might have less pain if you sleep on a mattress that is firm enough to support your back.  Keep all follow-up visits as told by your doctor. This is important. Contact a doctor if:  You have pain that: ? Wakes you up when you are sleeping. ? Gets worse when you lie down. ? Is worse than the pain you have had in the past. ? Lasts longer than 4 weeks.  You lose weight for without trying. Get help right away if:  You cannot control when you pee (urinate) or poop (have a bowel movement).  You have weakness in any of these areas and it gets worse. ? Lower back. ? Lower belly (pelvis). ? Butt (buttocks). ? Legs.  You have redness or swelling of your back.  You have a burning feeling when you pee. This information is not intended to replace   advice given to you by your health care provider. Make sure you discuss any questions you have with your health care provider. Document Released: 06/03/2008 Document Revised: 01/31/2016 Document Reviewed: 05/04/2015 Elsevier Interactive Patient Education  2018 [employer] Inc.  

## 2018-01-14 NOTE — Progress Notes (Signed)
Subjective:    [name], female    DOB: July 28, 1981, 37 y.o.   MRN: [id number]  HPI  Pt presents to the clinic today to follow up low back pain and left hip pain. This started about 4 months ago. She describes the pain as sharp and achy. She did have some numbness and tingling that radiates down to her left knee but that has resolved. The pain is worse with laying down and walking upstairs. She was seen 3/26 for the same. She was diagnosed with possible sciatica, treated with a Pred Taper and Methocarbamol. She reports some improvement with the Prednisone and Methocarbamol, but she reports in the last 1-2 weeks, her pain seems to returning.   Review of Systems   Past Medical History:  Diagnosis Date  . Allergy   . Asthma   . Complication of anesthesia    hard to wake up post-op  . 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 Depression   . Difficulty swallowing pills   . Migraine   . Multiple body piercings    nose and both ears - unsure if she can remove for surgery    Current Outpatient Medications  Medication Sig Dispense Refill  . cetirizine (ZYRTEC) 10 MG tablet Take 10 mg by mouth daily.    . methocarbamol (ROBAXIN) 500 MG tablet Take 1 tablet (500 mg total) by mouth every 8 (eight) hours as needed for muscle spasms. 20 tablet 0  . predniSONE (DELTASONE) 10 MG tablet Take 3 tabs on days 1-3, take 2 tabs on days 4-6, take 1 tab on days 7-9 18 tablet 0   No current facility-administered medications for this visit.     Allergies  Allergen Reactions  . Imitrex [Sumatriptan Succinate] Rash and Other (See Comments)    FEELS LIKE SKIN IS BURNING  . Bupropion Other (See Comments)    CAUSED A PANIC ATTACK    Family History  Problem Relation Age of Onset  . Breast cancer Maternal Aunt   . Breast cancer Maternal Grandmother   . Heart disease Maternal Grandmother   . Hypertension Maternal Grandmother   . Cancer Paternal Grandmother      ovarian/uterine? unsure    Social History   Socioeconomic History  . Marital status: Legally Separated    Spouse name: Not on file  . Number of children: Not on file  . Years of education: Not on file  . Highest education level: Not on file  Occupational History  . Not on file  Social Needs  . Financial resource strain: Not on file  . Food insecurity:    Worry: Not on file    Inability: Not on file  . Transportation needs:    Medical: Not on file    Non-medical: Not on file  Tobacco Use  . Smoking status: Never Smoker  . Smokeless tobacco: Never Used  Substance and Sexual Activity  . Alcohol use: Yes    Comment: occasionally  . Drug use: No  . Sexual activity: Yes    Birth control/protection: Surgical  Lifestyle  . Physical activity:    Days per week: Not on file    Minutes per session: Not on file  . Stress: Not on file  Relationships  . Social connections:    Talks on phone: Not on file    Gets together: Not on file    Attends religious service: Not on file    Active member  of club or organization: Not on file    Attends meetings of clubs or organizations: Not on file    Relationship status: Not on file  . Intimate partner violence:    Fear of current or ex partner: Not on file    Emotionally abused: Not on file    Physically abused: Not on file    Forced sexual activity: Not on file  Other Topics Concern  . Not on file  Social History Narrative  . Not on file     Constitutional: Denies fever, malaise, fatigue, headache or abrupt weight changes.  Musculoskeletal: Pt reports low back pain, left hip pain. Denies decrease in range of motion, difficulty with gait, muscle pain or joint swelling.  Skin: Denies redness, rashes, lesions or ulcercations.  Neurological: Denies problems with balance and coordination.    No other specific complaints in a complete review of systems (except as listed in HPI above).   Objective:   Physical Exam  BP 122/80    Pulse 63   Temp 97.9 F (36.6 C) (Oral)   Wt 242 lb (109.8 kg)   LMP 01/13/2018   SpO2 97%   BMI 44.26 kg/m  Wt Readings from Last 3 Encounters:  01/14/18 242 lb (109.8 kg)  12/01/17 244 lb (110.7 kg)  11/10/16 237 lb 6.4 oz (107.7 kg)    General: Appears her stated age, obese in NAD. Musculoskeletal: Normal flexion, extension, rotation and lateral bending of the spine. Mild bony tenderness noted over the lumbar spine. Normal flexion, extension, adduction, abduction, internal and external rotation of the left hip. Pain with palpation over the left trochanteric bursa. Strength 5/5 BLE. Able to walk on heels and tiptoes. No difficulty with gait.  Neurological: Alert and oriented. Positive SLR on the left.   BMET    Component Value Date/Time   NA 138 07/05/2016 0535   NA 142 06/27/2016 1412   NA 139 10/22/2011 1515   K 3.9 07/05/2016 0535   K 4.0 10/22/2011 1515   K 4.0 10/22/2011 1515   CL 104 07/05/2016 0535   CL 107 10/22/2011 1515   CL 107 10/22/2011 1515   CO2 26 07/05/2016 0535   CO2 27 10/22/2011 1515   CO2 27 10/22/2011 1515   GLUCOSE 96 07/05/2016 0535   GLUCOSE 85 10/22/2011 1515   BUN 15 07/05/2016 0535   BUN 13 06/27/2016 1412   BUN 14 10/22/2011 1515   CREATININE 0.79 07/05/2016 0535   CREATININE 0.86 10/22/2011 1515   CREATININE 0.86 10/22/2011 1515   CALCIUM 9.1 07/05/2016 0535   CALCIUM 8.9 10/22/2011 1515   CALCIUM 8.9 10/22/2011 1515   GFRNONAA >60 07/05/2016 0535   GFRNONAA >60 10/22/2011 1515   GFRAA >60 07/05/2016 0535   GFRAA >60 10/22/2011 1515    Lipid Panel     Component Value Date/Time   CHOL 217 (H) 06/27/2016 1412   TRIG 276 (H) 06/27/2016 1412   HDL 37 (L) 06/27/2016 1412   CHOLHDL 5.9 (H) 06/27/2016 1412   LDLCALC 125 (H) 06/27/2016 1412    CBC    Component Value Date/Time   WBC 10.3 07/05/2016 0535   RBC 4.97 07/05/2016 0535   HGB 14.3 07/05/2016 0535   HGB 13.6 06/27/2016 1412   HCT 41.7 07/05/2016 0535   HCT 41.5  06/27/2016 1412   HCT 39 10/22/2011 1515   PLT 268 07/05/2016 0535   PLT 358 06/27/2016 1412   MCV 83.9 07/05/2016 0535   MCV 85 06/27/2016 1412  MCV 85 10/22/2011 1515   MCH 28.7 07/05/2016 0535   MCHC 34.3 07/05/2016 0535   RDW 13.8 07/05/2016 0535   RDW 14.0 06/27/2016 1412   RDW 13.6 10/22/2011 1515   LYMPHSABS 1.7 12/25/2008 2037   MONOABS 1.1 (H) 12/25/2008 2037   EOSABS 0.4 12/25/2008 2037   BASOSABS 0.0 12/25/2008 2037    Hgb A1C Lab Results  Component Value Date   HGBA1C 5.1 06/27/2016            Assessment & Plan:   Acute Low Back Pain with Left Sided Sciatica, Left Hip Pain:  Likely some bursa involvement as well Discussed xrays of lumbar spine and left hip She prefers to try chiropractic care- referral placed (will hold off on xrays because they will want to do their own xrays) Encouraged stretching, NSAID's Continue Robaxin as needed  Return precautions discussed Webb Silversmith, NP

## 2018-01-29 ENCOUNTER — Ambulatory Visit (INDEPENDENT_AMBULATORY_CARE_PROVIDER_SITE_OTHER): Payer: BLUE CROSS/BLUE SHIELD | Admitting: Internal Medicine

## 2018-01-29 ENCOUNTER — Telehealth: Payer: Self-pay

## 2018-01-29 ENCOUNTER — Encounter: Payer: Self-pay | Admitting: Internal Medicine

## 2018-01-29 VITALS — BP 118/80 | HR 87 | Temp 98.2°F | Ht 62.0 in | Wt 247.0 lb

## 2018-01-29 DIAGNOSIS — J0140 Acute pansinusitis, unspecified: Secondary | ICD-10-CM | POA: Diagnosis not present

## 2018-01-29 DIAGNOSIS — G43809 Other migraine, not intractable, without status migrainosus: Secondary | ICD-10-CM | POA: Diagnosis not present

## 2018-01-29 MED ORDER — RIZATRIPTAN BENZOATE 10 MG PO TBDP: 10.0000 mg | ORAL_TABLET | ORAL | 0 refills | Status: DC | PRN

## 2018-01-29 MED ORDER — AMOXICILLIN 500 MG PO TABS: 1000.0000 mg | ORAL_TABLET | Freq: Two times a day (BID) | ORAL | 0 refills | Status: AC

## 2018-01-29 NOTE — Telephone Encounter (Signed)
Copied from [place] [id number]. Topic: General - Other >> Jan 29, 2018  4:09 PM Oneta Rack wrote: Relation to pt: self Call back number:[phone]   Reason for call:  Patient reflecting a Dr.Note reflecting she was seen today and would like to pick up letter today, stating she lives right up the street. Informed patient due to the time being sensitive note may not be ready until Tuesday, please advise  >> Jan 29, 2018  4:13 PM Oneta Rack wrote: Relation to pt: self Call back number:[phone]   Reason for call:  Patient reflecting a Dr.Note reflecting she was seen today and would like to pick up letter today, stating she lives right up the street. Informed patient due to the time being sensitive note may not be ready until Tuesday, please advise

## 2018-01-29 NOTE — Patient Instructions (Signed)
Please add loratadine 10mg ---- 1-2 daily. If your sinus symptoms worsen over the next few days, start the amoxicillin antibiotic Add the maxalt for the migraine

## 2018-01-29 NOTE — Assessment & Plan Note (Signed)
Not clearly infectious Will add 2nd antihistamine Amoxil if worsens

## 2018-01-29 NOTE — Progress Notes (Signed)
Subjective:    [name], female    DOB: 06-03-1981, 37 y.o.   MRN: [id number]  HPI Here due to headache and sinus symptoms  Has had intermittent headache for about a week They are usually ocular migraines--gets visual aura a few days ago x 2 days Then awakened this AM with "worst headache" Has bad nasal congestion and pressure in right ear Uses advil for migraines---helps only for a while Didn't help last night  No fever Some post nasal drip--throat is raw Cough started today---some mucus No SOB  Current Outpatient Medications on File Prior to Visit  Medication Sig Dispense Refill  . cetirizine (ZYRTEC) 10 MG tablet Take 10 mg by mouth daily.    Marland Kitchen ibuprofen (ADVIL,MOTRIN) 200 MG tablet Take 200 mg by mouth as needed.     No current facility-administered medications on file prior to visit.     Allergies  Allergen Reactions  . Imitrex [Sumatriptan Succinate] Rash and Other (See Comments)    FEELS LIKE SKIN IS BURNING  . Bupropion Other (See Comments)    CAUSED A PANIC ATTACK    Past Medical History:  Diagnosis Date  . Allergy   . Asthma   . Complication of anesthesia    hard to wake up post-op  . 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 Depression   . Difficulty swallowing pills   . Migraine   . Multiple body piercings    nose and both ears - unsure if she can remove for surgery    Past Surgical History:  Procedure Laterality Date  . ABDOMINAL SURGERY     for ectopic pregnancy  . CESAREAN SECTION  02/28/3005; 01/04/2008  . ECTOPIC PREGNANCY SURGERY Left 01/28/2006   mini laparotomy; partial left salpingectomy  . MASS EXCISION Right 08/01/2013   Procedure: EXCISION MASS RIGHT LONG FINGER;  Surgeon: Tennis Must, MD;  Location: [place];  Service: Orthopedics;  Laterality: Right;  . TUBAL LIGATION Right 01/04/2008    Family History  Problem Relation Age of Onset  . Breast cancer Maternal Aunt   .  Breast cancer Maternal Grandmother   . Heart disease Maternal Grandmother   . Hypertension Maternal Grandmother   . Cancer Paternal Grandmother        ovarian/uterine? unsure    Social History   Socioeconomic History  . Marital status: Legally Separated    Spouse name: Not on file  . Number of children: Not on file  . Years of education: Not on file  . Highest education level: Not on file  Occupational History  . Not on file  Social Needs  . Financial resource strain: Not on file  . Food insecurity:    Worry: Not on file    Inability: Not on file  . Transportation needs:    Medical: Not on file    Non-medical: Not on file  Tobacco Use  . Smoking status: Never Smoker  . Smokeless tobacco: Never Used  Substance and Sexual Activity  . Alcohol use: Yes    Comment: occasionally  . Drug use: No  . Sexual activity: Yes    Birth control/protection: Surgical  Lifestyle  . Physical activity:    Days per week: Not on file    Minutes per session: Not on file  . Stress: Not on file  Relationships  . Social connections:    Talks on phone: Not on file    Gets together:  Not on file    Attends religious service: Not on file    Active member of club or organization: Not on file    Attends meetings of clubs or organizations: Not on file    Relationship status: Not on file  . Intimate partner violence:    Fear of current or ex partner: Not on file    Emotionally abused: Not on file    Physically abused: Not on file    Forced sexual activity: Not on file  Other Topics Concern  . Not on file  Social History Narrative  . Not on file   Review of Systems Uses zyrtec for allergies every day No rash No N/V Eating okay     Objective:   Physical Exam  HENT:  Mouth/Throat: Oropharynx is clear and moist. No oropharyngeal exudate.  Maxillary and frontal tenderness Moderate pale nasal congestion--especially on right TMs not inflamed  Neck: No thyromegaly present.  Respiratory:  Effort normal and breath sounds normal. No respiratory distress. She has no wheezes. She has no rales.  Lymphadenopathy:    She has no cervical adenopathy.           Assessment & Plan:

## 2018-01-29 NOTE — Assessment & Plan Note (Signed)
Exacerbated with sinus symptoms Rx for maxalt again

## 2018-01-29 NOTE — Telephone Encounter (Signed)
I spoke with pt and she forgot to get a note that she was seen here on 01/29/18. Letter printed pt was seen at [facility] today. Pt will pick up note at front desk.

## 2018-02-02 ENCOUNTER — Telehealth: Payer: Self-pay

## 2018-02-02 NOTE — Telephone Encounter (Signed)
Copied from [place] [id number]. Topic: General - Other >> Jan 29, 2018  4:09 PM Oneta Rack wrote: Relation to pt: self Call back [phone]   Reason for call:  Patient reflecting a Dr.Note reflecting she was seen today and would like to pick up letter today, stating she lives right up the street. Informed patient due to the time being sensitive note may not be ready until Tuesday, please advise  >> Jan 29, 2018  4:13 PM Oneta Rack wrote: Relation to pt: self Call back [phone]   Reason for call:  Patient reflecting a Dr.Note reflecting she was seen today and would like to pick up letter today, stating she lives right up the street. Informed patient due to the time being sensitive note may not be ready until Tuesday, please advise  >> Jan 29, 2018  4:45 PM Bea Graff, NT wrote: Pt would like not faxed on Tuesday instead of picking the note up. Pt will call Tuesday with fax number. >> Jan 29, 2018  4:59 PM Helene Shoe, LPN wrote: Please see phone note 01/29/18. >> Feb 02, 2018  9:19 AM Marja Kays F wrote: Pt called on Friday to request a return to work note but she did not make it to the office before they closed so she called to state to please fax it on Tuesday to [phone]

## 2018-02-02 NOTE — Telephone Encounter (Signed)
I went to fax work note and I asked [employer] team lead and she advised we are not supposed to fax work notes; pt should pick up or we can mail to pt. I spoke with pt and she will try to pick up on 02/03/18. Note back at front desk.

## 2018-02-02 NOTE — Telephone Encounter (Signed)
Do you have Deanna note or is it up front?

## 2018-02-02 NOTE — Telephone Encounter (Signed)
It is at front desk. I will fax since there is a fax # to fax to now.

## 2018-02-02 NOTE — Telephone Encounter (Signed)
Deanna Thomas, it looks like you did a note that day. Is that correct?

## 2018-02-02 NOTE — Telephone Encounter (Signed)
I did speak with pt and had note at front for pick up on Friday; but then got another note from [facility] that pt wanted note faxed and would cb with fax #.

## 2018-02-02 NOTE — Telephone Encounter (Signed)
Pt calling back to ask about status of work note, pt states she called and left fax number this am, but  It is: Fax: [phone] send asap. thanks

## 2018-03-22 ENCOUNTER — Encounter: Payer: Self-pay | Admitting: Family Medicine

## 2018-03-22 ENCOUNTER — Ambulatory Visit (INDEPENDENT_AMBULATORY_CARE_PROVIDER_SITE_OTHER): Payer: BLUE CROSS/BLUE SHIELD | Admitting: Family Medicine

## 2018-03-22 ENCOUNTER — Ambulatory Visit: Payer: BLUE CROSS/BLUE SHIELD | Admitting: Family Medicine

## 2018-03-22 VITALS — BP 112/76 | HR 81 | Temp 97.6°F | Ht 62.0 in

## 2018-03-22 DIAGNOSIS — H6991 Unspecified Eustachian tube disorder, right ear: Secondary | ICD-10-CM

## 2018-03-22 DIAGNOSIS — J069 Acute upper respiratory infection, unspecified: Secondary | ICD-10-CM | POA: Diagnosis not present

## 2018-03-22 NOTE — Progress Notes (Signed)
Subjective:    [name], female    DOB: 07/01/1981, 37 y.o.   MRN: [id number]  HPI This is a 37 yo female who presents today with 3 days of right ear pain. Had a sore throat a couple of days ago. Cough, dry. Feels congested in chest. Pain neck, feels like she slept wrong. Pain into tops of shoulders. Ear pain into side of face. Feels like fluid is moving around, pressure. No drainage. Little nasal drainage.  Has been taking Advil, 400 mg twice a day without relief. Used some Biofreeze on neck without relief.  No recent swimming.   Past Medical History:  Diagnosis Date  . Allergy   . Asthma   . Complication of anesthesia    hard to wake up post-op  . 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 Depression   . Difficulty swallowing pills   . Migraine   . Multiple body piercings    nose and both ears - unsure if she can remove for surgery   Past Surgical History:  Procedure Laterality Date  . ABDOMINAL SURGERY     for ectopic pregnancy  . CESAREAN SECTION  02/28/3005; 01/04/2008  . ECTOPIC PREGNANCY SURGERY Left 01/28/2006   mini laparotomy; partial left salpingectomy  . MASS EXCISION Right 08/01/2013   Procedure: EXCISION MASS RIGHT LONG FINGER;  Surgeon: Tennis Must, MD;  Location: [place];  Service: Orthopedics;  Laterality: Right;  . TUBAL LIGATION Right 01/04/2008   Family History  Problem Relation Age of Onset  . Breast cancer Maternal Aunt   . Breast cancer Maternal Grandmother   . Heart disease Maternal Grandmother   . Hypertension Maternal Grandmother   . Cancer Paternal Grandmother        ovarian/uterine? unsure   Social History   Tobacco Use  . Smoking status: Never Smoker  . Smokeless tobacco: Never Used  Substance Use Topics  . Alcohol use: Yes    Comment: occasionally  . Drug use: No      Review of Systems Per HPI    Objective:   Physical Exam  Constitutional: She is oriented to person,  place, and time. She appears well-developed and well-nourished. No distress.  HENT:  Head: Normocephalic and atraumatic.  Right Ear: External ear and ear canal normal. There is tenderness. No drainage or swelling. A middle ear effusion is present.  Left Ear: Tympanic membrane, external ear and ear canal normal.  Nose: Nose normal. Right sinus exhibits no maxillary sinus tenderness and no frontal sinus tenderness. Left sinus exhibits no maxillary sinus tenderness and no frontal sinus tenderness.  Mouth/Throat: Oropharynx is clear and moist. No oropharyngeal exudate.  Eyes: Conjunctivae are normal.  Neck: Normal range of motion. Neck supple.  Cardiovascular: Normal rate, regular rhythm and normal heart sounds.  Pulmonary/Chest: Effort normal and breath sounds normal.  Occasional dry cough.   Lymphadenopathy:    She has no cervical adenopathy.  Neurological: She is alert and oriented to person, place, and time.  Skin: Skin is warm and dry. She is not diaphoretic.  Psychiatric: She has a normal mood and affect. Her behavior is normal. Judgment and thought content normal.  Vitals reviewed.     BP 112/76 (BP Location: Right Arm, Patient Position: Sitting, Cuff Size: Large)   Pulse 81   Temp 97.6 F (36.4 C) (Oral)   Ht 5\' 2"  (1.575 m)   LMP 03/08/2018   SpO2 97%  BMI 45.18 kg/m  Wt Readings from Last 3 Encounters:  01/29/18 247 lb (112 kg)  01/14/18 242 lb (109.8 kg)  12/01/17 244 lb (110.7 kg)       Assessment & Plan:  1. Disorder of right eustachian tube -Provided written and verbal information regarding diagnosis and treatment. -  Patient Instructions  Delsym for cough  Start Afrin type nasal spray two sprays in each nostril twice a day for 3 days max  After using Afrin, use Flonase Sensimist (look for a coupon on line), then go to bedime  Can use Sudafed (generic is fine) in the am and early afternoon- get from behind pharmacy counter  Advil- 3-4 tablets twice a day  as needed  If not better by end of week, please let me know    2. Viral URI - as above   Clarene Reamer, FNP-BC  [facility]  03/22/2018 9:32 PM

## 2018-03-22 NOTE — Patient Instructions (Signed)
Delsym for cough  Start Afrin type nasal spray two sprays in each nostril twice a day for 3 days max  After using Afrin, use Flonase Sensimist (look for a coupon on line), then go to bedime  Can use Sudafed (generic is fine) in the am and early afternoon- get from behind pharmacy counter  Advil- 3-4 tablets twice a day as needed  If not better by end of week, please let me know

## 2018-04-02 ENCOUNTER — Telehealth: Payer: Self-pay | Admitting: Internal Medicine

## 2018-04-02 ENCOUNTER — Ambulatory Visit: Payer: Self-pay

## 2018-04-02 ENCOUNTER — Ambulatory Visit: Payer: BLUE CROSS/BLUE SHIELD | Admitting: Family Medicine

## 2018-04-02 DIAGNOSIS — L500 Allergic urticaria: Secondary | ICD-10-CM | POA: Diagnosis not present

## 2018-04-02 DIAGNOSIS — T63441A Toxic effect of venom of bees, accidental (unintentional), initial encounter: Secondary | ICD-10-CM | POA: Diagnosis not present

## 2018-04-02 NOTE — Telephone Encounter (Signed)
Incoming call from patient stating she apparently sat on a yellow jacket nest on yesterday. Various stings on her back , thighs arms legs. r Reports the size varies from size of a quarter and bigger. Red in color .Pain is rated mild.  Itching and burning is what is irritating the patient the most.  . Patient state she was stung when we was younger. Arm swelled up.  Patient complains of  "super itching and burning". Has taken multiple dose of benadryl.  Applied a lot of hydrocortisone cream to the sites.  Would like for something to be called in for itching an burning.  . Can be reached at [phone]. Reason for Disposition . Normal local reaction to bee, wasp, or yellow jacket sting  Answer Assessment - Initial Assessment Questions 1. TYPE: "What type of sting was it?" (bee, yellow jacket, etc.)      Yellow jackets 2. ONSET: "When did it occur?"      yesterday 3. LOCATION: "Where is the sting located?"  "How many stings?"      Back , legs arms. thighs 4. SWELLING SIZE: "How big is the swelling?" (inches or centimeters)     Size of quarter , biggger 5. REDNESS: "Is the area red or pink?" If so, ask "What size is area of redness?" (inches or cm). "When did the redness start?"     red 6. PAIN: "Is there any pain?" If so, ask: "How bad is it?"  (Scale 1-10; or mild, moderate, severe)     Mild , just irritating with burning and itching  7. ITCHING: "Is there any itching?" If so, ask: "How bad is it?"      Yes  8. RESPIRATORY DISTRESS: "Describe your breathing."     No respiratory distress noted9. PRIOR REACTIONS: "Have you had any severe allergic reactions to stings in the past?" if yes, ask: "What happened?"     Youger  Arm swelled up 10. OTHER SYMPTOMS: "Do you have any other symptoms?" (e.g., face or tongue swelling, new rash elsewhere, abdominal pain, vomiting)       Supper itching burning 11. PREGNANCY: "Is there any chance you are pregnant?" "When was your last menstrual period?"  Beginning of the month  Protocols used: BEE OR YELLOW JACKET STING-A-AH

## 2018-04-02 NOTE — Telephone Encounter (Signed)
See Triage note 

## 2018-04-02 NOTE — Telephone Encounter (Signed)
Telephone call from patient stating she is running late due to an accident. Will not be able to make it on time to appointment provided with a [place] provider. Staes she is near an Urgent Care.  Wanted to know if it would be alright if she go there and had records of visit transferred  To her PCP? Recommended to patient tht it would be alright.  Telephone call to [facility], canceled appointment.

## 2018-04-02 NOTE — Telephone Encounter (Signed)
Needs to be seen

## 2018-04-02 NOTE — Telephone Encounter (Unsigned)
Copied from [place] [id number]. Topic: General - Other >> Apr 02, 2018 10:30 AM Deanna Thomas wrote: Reason for CRM: Pt states she received several stings from yellow jacket nest. Pt states she has been taking Benadryl but she is still itchy. Pt requests a call back to discuss as she would like to try to resolve the issue without coming in. Cb# [phone]

## 2018-04-08 ENCOUNTER — Encounter: Payer: Self-pay | Admitting: Internal Medicine

## 2018-04-29 DIAGNOSIS — S61215A Laceration without foreign body of left ring finger without damage to nail, initial encounter: Secondary | ICD-10-CM | POA: Diagnosis not present

## 2018-04-29 DIAGNOSIS — Z23 Encounter for immunization: Secondary | ICD-10-CM | POA: Diagnosis not present

## 2018-05-21 ENCOUNTER — Encounter: Payer: Self-pay | Admitting: Family Medicine

## 2018-05-21 ENCOUNTER — Ambulatory Visit (INDEPENDENT_AMBULATORY_CARE_PROVIDER_SITE_OTHER): Payer: BLUE CROSS/BLUE SHIELD | Admitting: Family Medicine

## 2018-05-21 VITALS — BP 108/80 | HR 76 | Temp 98.5°F | Ht 62.0 in | Wt 244.0 lb

## 2018-05-21 DIAGNOSIS — H6980 Other specified disorders of Eustachian tube, unspecified ear: Secondary | ICD-10-CM

## 2018-05-21 DIAGNOSIS — H6990 Unspecified Eustachian tube disorder, unspecified ear: Secondary | ICD-10-CM

## 2018-05-21 DIAGNOSIS — M26629 Arthralgia of temporomandibular joint, unspecified side: Secondary | ICD-10-CM

## 2018-05-21 MED ORDER — MELOXICAM 15 MG PO TABS: 15.0000 mg | ORAL_TABLET | Freq: Every day | ORAL | 0 refills | Status: DC

## 2018-05-21 NOTE — Patient Instructions (Addendum)
Stop ibuprofen, change to meloxicam.  Take with food.  Ice your TMJ for 5 minutes at a time.  Avoid tough foods.    Gently try to pop you ears.  Use flonase 2 sprays in each nostril once a day.  Continue zyrtec.  Take care.  Glad to see you.

## 2018-05-21 NOTE — Progress Notes (Signed)
Sx for the last month with R jaw pain.  She has tried to avoid chewy foods.  She has tried stretching.  Still with R ear pain, popping in the ear.  Feels like something is in the canal.  No L sided sx. Taking advil and tylenol w/o relief.    She has h/o TMJ sx in distant past, around the time of wisdom teeth extraction. That resolved in the interval and she went for a prolonged period of time without symptoms.  She may have some muffled hearing occ on the R side. Pain sleeping on R side. No rash.  No FCNAV.  Recently stuffy but that was likely related to weather changes.  No dysphagia.   Has pain chewing.    On zyrtec at baseline.   She has seen her dentist in the meantime.    Meds, vitals, and allergies reviewed.   ROS: Per HPI unless specifically indicated in ROS section   nad ncat She has R ETD on exam. B canals pinna and TMs wnl Nasal exam not stuffy R TMJ area ttp w/o rash.   OP wnl Neck supple, no LA

## 2018-05-23 DIAGNOSIS — M26629 Arthralgia of temporomandibular joint, unspecified side: Secondary | ICD-10-CM | POA: Insufficient documentation

## 2018-05-23 DIAGNOSIS — H6980 Other specified disorders of Eustachian tube, unspecified ear: Secondary | ICD-10-CM | POA: Insufficient documentation

## 2018-05-23 DIAGNOSIS — H6990 Unspecified Eustachian tube disorder, unspecified ear: Secondary | ICD-10-CM | POA: Insufficient documentation

## 2018-05-23 NOTE — Assessment & Plan Note (Signed)
Discussed with patient about options anatomy. Stop ibuprofen, change to meloxicam.  Take with food.  Ice TMJ for 5 minutes at a time.  Avoid tough foods.   F/u prn.

## 2018-05-23 NOTE — Assessment & Plan Note (Signed)
Discussed anatomy and options and rationale for treatment.  She understood.  Gently perform Valsalva.  Use flonase 2 sprays in each nostril once a day.  Continue zyrtec.  Follow-up as needed.  She agrees.

## 2018-05-31 DIAGNOSIS — N909 Noninflammatory disorder of vulva and perineum, unspecified: Secondary | ICD-10-CM | POA: Diagnosis not present

## 2018-06-13 ENCOUNTER — Other Ambulatory Visit: Payer: Self-pay | Admitting: Family Medicine

## 2018-06-14 NOTE — Telephone Encounter (Signed)
Electronic refill request Meloxicam Last refill 05/21/18 #30 Last office visit 05/21/18

## 2018-06-17 DIAGNOSIS — H5213 Myopia, bilateral: Secondary | ICD-10-CM | POA: Diagnosis not present

## 2018-06-28 ENCOUNTER — Ambulatory Visit (INDEPENDENT_AMBULATORY_CARE_PROVIDER_SITE_OTHER): Payer: BLUE CROSS/BLUE SHIELD | Admitting: Family Medicine

## 2018-06-28 ENCOUNTER — Other Ambulatory Visit: Payer: Self-pay | Admitting: Family Medicine

## 2018-06-28 ENCOUNTER — Encounter: Payer: Self-pay | Admitting: Family Medicine

## 2018-06-28 ENCOUNTER — Ambulatory Visit: Payer: BLUE CROSS/BLUE SHIELD | Admitting: Internal Medicine

## 2018-06-28 ENCOUNTER — Encounter: Payer: Self-pay | Admitting: *Deleted

## 2018-06-28 DIAGNOSIS — Z0289 Encounter for other administrative examinations: Secondary | ICD-10-CM

## 2018-06-28 DIAGNOSIS — B9789 Other viral agents as the cause of diseases classified elsewhere: Secondary | ICD-10-CM | POA: Diagnosis not present

## 2018-06-28 DIAGNOSIS — J4521 Mild intermittent asthma with (acute) exacerbation: Secondary | ICD-10-CM

## 2018-06-28 DIAGNOSIS — J069 Acute upper respiratory infection, unspecified: Secondary | ICD-10-CM

## 2018-06-28 MED ORDER — ALBUTEROL SULFATE HFA 108 (90 BASE) MCG/ACT IN AERS: 2.0000 | INHALATION_SPRAY | Freq: Four times a day (QID) | RESPIRATORY_TRACT | 2 refills | Status: DC | PRN

## 2018-06-28 NOTE — Patient Instructions (Signed)
Rest, fluids.  Start Mucinex DM twice daily.  Can use albuterol inhaler as needed for wheeze and SOB.  Call if not improving in next 5-7 day, sooner if more SOB.  Go to ER for severe shortness of breath.

## 2018-06-28 NOTE — Telephone Encounter (Signed)
Electronic refill request. Meloxicam Last office visit:   06/28/2018 Acute Bedsole Last Filled:   #30 on 06/14/18 Please advise.

## 2018-06-28 NOTE — Progress Notes (Signed)
   Subjective:    [name], female    DOB: 1981/02/21, 37 y.o.   MRN: [id number]  Cough  This is a new problem. The current episode started in the past 7 days. The problem has been gradually worsening. The cough is productive of sputum. Associated symptoms include headaches, nasal congestion, rhinorrhea, a sore throat, shortness of breath and wheezing. Pertinent negatives include no chest pain, chills, ear congestion, ear pain, fever or postnasal drip. The symptoms are aggravated by lying down. Risk factors: nonsmoker. Her past medical history is significant for asthma and environmental allergies. There is no history of COPD or pneumonia.  Wheezing   Associated symptoms include coughing, headaches, rhinorrhea, shortness of breath and a sore throat. Pertinent negatives include no chest pain, chills, ear pain or fever. Her past medical history is significant for asthma. There is no history of COPD or pneumonia.  Headache   Associated symptoms include coughing, rhinorrhea and a sore throat. Pertinent negatives include no ear pain or fever.    Daily use of zyrtec for allergies.   HX of asthma.. No longer has albuterol inhaler.   Review of Systems  Constitutional: Negative for chills and fever.  HENT: Positive for rhinorrhea and sore throat. Negative for ear pain and postnasal drip.   Respiratory: Positive for cough, shortness of breath and wheezing.   Cardiovascular: Negative for chest pain.  Allergic/Immunologic: Positive for environmental allergies.  Neurological: Positive for headaches.       Objective:   Physical Exam  Constitutional: Vital signs are normal. She appears well-developed and well-nourished. She is cooperative.  Non-toxic appearance. She does not appear ill. No distress.  HENT:  Head: Normocephalic.  Right Ear: Hearing, tympanic membrane, external ear and ear canal normal. Tympanic membrane is not erythematous, not retracted and not bulging.  Left Ear:  Hearing, tympanic membrane, external ear and ear canal normal. Tympanic membrane is not erythematous, not retracted and not bulging.  Nose: Mucosal edema and rhinorrhea present. Right sinus exhibits no maxillary sinus tenderness and no frontal sinus tenderness. Left sinus exhibits no maxillary sinus tenderness and no frontal sinus tenderness.  Mouth/Throat: Uvula is midline, oropharynx is clear and moist and mucous membranes are normal.  Eyes: Pupils are equal, round, and reactive to light. Conjunctivae, EOM and lids are normal. Lids are everted and swept, no foreign bodies found.  Neck: Trachea normal and normal range of motion. Neck supple. Carotid bruit is not present. No thyroid mass and no thyromegaly present.  Cardiovascular: Normal rate, regular rhythm, S1 normal, S2 normal, normal heart sounds, intact distal pulses and normal pulses. Exam reveals no gallop and no friction rub.  No murmur heard. Pulmonary/Chest: Effort normal and breath sounds normal. No tachypnea. No respiratory distress. She has no decreased breath sounds. She has no wheezes. She has no rhonchi. She has no rales.  Neurological: She is alert.  Skin: Skin is warm, dry and intact. No rash noted.  Psychiatric: Her speech is normal and behavior is normal. Judgment normal. Her mood appears not anxious. Cognition and memory are normal. She does not exhibit a depressed mood.          Assessment & Plan:

## 2018-06-28 NOTE — Assessment & Plan Note (Signed)
Refilled albuterol inhaler. No wheeze on exam in office today.

## 2018-06-28 NOTE — Assessment & Plan Note (Signed)
Symptomatic care 

## 2018-06-29 NOTE — Telephone Encounter (Signed)
How is she out? This was filled 10/7 and she is only supposed to take 1 daily.

## 2018-08-03 DIAGNOSIS — Z124 Encounter for screening for malignant neoplasm of cervix: Secondary | ICD-10-CM | POA: Diagnosis not present

## 2018-08-03 DIAGNOSIS — Z6841 Body Mass Index (BMI) 40.0 and over, adult: Secondary | ICD-10-CM | POA: Diagnosis not present

## 2018-08-03 DIAGNOSIS — N920 Excessive and frequent menstruation with regular cycle: Secondary | ICD-10-CM | POA: Diagnosis not present

## 2018-08-03 DIAGNOSIS — Z01419 Encounter for gynecological examination (general) (routine) without abnormal findings: Secondary | ICD-10-CM | POA: Diagnosis not present

## 2018-08-03 LAB — HM PAP SMEAR: HM Pap smear: NORMAL

## 2018-08-04 ENCOUNTER — Encounter: Payer: Self-pay | Admitting: Internal Medicine

## 2018-08-26 DIAGNOSIS — Z3202 Encounter for pregnancy test, result negative: Secondary | ICD-10-CM | POA: Diagnosis not present

## 2018-08-26 DIAGNOSIS — Z6841 Body Mass Index (BMI) 40.0 and over, adult: Secondary | ICD-10-CM | POA: Diagnosis not present

## 2018-08-26 DIAGNOSIS — N920 Excessive and frequent menstruation with regular cycle: Secondary | ICD-10-CM | POA: Diagnosis not present

## 2018-09-17 DIAGNOSIS — Z6841 Body Mass Index (BMI) 40.0 and over, adult: Secondary | ICD-10-CM | POA: Diagnosis not present

## 2018-09-17 DIAGNOSIS — N925 Other specified irregular menstruation: Secondary | ICD-10-CM | POA: Diagnosis not present

## 2018-11-18 ENCOUNTER — Encounter: Payer: BLUE CROSS/BLUE SHIELD | Admitting: Internal Medicine

## 2018-11-24 ENCOUNTER — Telehealth: Payer: Self-pay

## 2018-11-24 DIAGNOSIS — J309 Allergic rhinitis, unspecified: Secondary | ICD-10-CM | POA: Diagnosis not present

## 2018-11-24 DIAGNOSIS — R070 Pain in throat: Secondary | ICD-10-CM | POA: Diagnosis not present

## 2018-11-24 DIAGNOSIS — R062 Wheezing: Secondary | ICD-10-CM | POA: Diagnosis not present

## 2018-11-24 NOTE — Telephone Encounter (Signed)
Noted. Doesn't sound like concern for COVID 19. She can make an appt if she wishes or if employer requires.

## 2018-11-24 NOTE — Telephone Encounter (Signed)
I spoke to patient. She said she thinks work will require her to be seen.  She may go to urgent care today or call us back to schedule appointment for tomorrow afternoon.

## 2018-11-24 NOTE — Telephone Encounter (Signed)
Pt called and works at [employer]; pts employer wanted pt to call office. Pt sees a lot of people at work and pt does not know if they are sick or not but since pt has been coughing her employer advised her to call our office. Pt has not traveled to destinations for [facility] and to pts knowledge has no known exposure to people with positive testing for covid or flu. Pt did travel to [facility] 11/20/18 - 11/21/18 and pt stated "they went everywhere). Pt has no fever;when pt cked temp it was 97 something but pts face is red and she feels hot.pt has runny nose with clear mucus. Pt has non prod cough with some SOB but pt has asthma. pts condition has not warranted pt to use her inhaler. I offered pt an appt to be seen but pt said she thinks she has allergies and maybe a cold and will speak with employer to see if she has to be seen, if so pt will cb for appt.

## 2018-11-26 ENCOUNTER — Telehealth: Payer: Self-pay

## 2018-11-26 ENCOUNTER — Ambulatory Visit (INDEPENDENT_AMBULATORY_CARE_PROVIDER_SITE_OTHER): Payer: Self-pay | Admitting: Internal Medicine

## 2018-11-26 ENCOUNTER — Encounter: Payer: Self-pay | Admitting: Internal Medicine

## 2018-11-26 ENCOUNTER — Other Ambulatory Visit: Payer: Self-pay

## 2018-11-26 DIAGNOSIS — R059 Cough, unspecified: Secondary | ICD-10-CM

## 2018-11-26 DIAGNOSIS — R05 Cough: Secondary | ICD-10-CM

## 2018-11-26 DIAGNOSIS — R519 Headache, unspecified: Secondary | ICD-10-CM

## 2018-11-26 DIAGNOSIS — R51 Headache: Secondary | ICD-10-CM

## 2018-11-26 DIAGNOSIS — R0602 Shortness of breath: Secondary | ICD-10-CM

## 2018-11-26 DIAGNOSIS — R448 Other symptoms and signs involving general sensations and perceptions: Secondary | ICD-10-CM

## 2018-11-26 DIAGNOSIS — R0989 Other specified symptoms and signs involving the circulatory and respiratory systems: Secondary | ICD-10-CM

## 2018-11-26 DIAGNOSIS — R6889 Other general symptoms and signs: Secondary | ICD-10-CM

## 2018-11-26 DIAGNOSIS — R0981 Nasal congestion: Secondary | ICD-10-CM

## 2018-11-26 MED ORDER — PROMETHAZINE-DM 6.25-15 MG/5ML PO SYRP: 5.0000 mL | ORAL_SOLUTION | Freq: Four times a day (QID) | ORAL | 0 refills | Status: DC | PRN

## 2018-11-26 MED ORDER — AZITHROMYCIN 250 MG PO TABS: ORAL_TABLET | ORAL | 0 refills | Status: DC

## 2018-11-26 NOTE — Telephone Encounter (Signed)
Called pt to cancel face to face appt, to offer phone visit, no answer and VM not set up

## 2018-11-26 NOTE — Progress Notes (Signed)
Virtual Visit via Telephone Note  I connected with Deanna Thomas on 11/26/18 at 3:10 by telephone and verified that I am speaking with the correct person using two identifiers.   I discussed the limitations, risks, security and privacy concerns of performing an evaluation and management service by telephone and the availability of in person appointments. I also discussed with the patient that there may be a patient responsible charge related to this service. The patient expressed understanding and agreed to proceed.   History of Present Illness: Pt reports headache, facial pressure, runny nose, nasal congestion and cough. This started 5 days ago. The headache is located in her forehead. She describes the pain as pressure. She is blowing clear mucous out of her nose. The cough is non productive. She reports mild SOB with coughing spells. She reports fever < 100.0, fatigue, chills and body aches. She went to [facility] 3/17 for the same. She was diagnosed with allergies. She was prescribed a Pred pack but she reports she has not taken it. She has been using Albuterol and Zyrtec with minimal relief. She has not had sick contacts. She recently travelled to [facility].    Observations/Objective: Able to talk in complete sentences. Cough dry sounding. No audible wheezing. Alert and oriented.    Assessment and Plan: Concern for allergies, viral/bacterial sinusitis, influenza, acute bronchitis. Advised her to take Pred pack as prescribed. Continue Zyrtec and Albuterol. Will cover with Azithromycin x 5 days, Promethazine DM cough syrup. Work note provided   Follow Up Instructions:    I discussed the assessment and treatment plan with the patient. The patient was provided an opportunity to ask questions and all were answered. The patient agreed with the plan and demonstrated an understanding of the instructions.   The patient was advised to call back or seek an in-person evaluation if the symptoms worsen or  if the condition fails to improve as anticipated.  I provided 8 minutes of non-face-to-face time during this encounter.   Webb Silversmith, NP

## 2018-11-26 NOTE — Telephone Encounter (Signed)
Pt is agreeable to phone visit, she is aware to be available to answer phone at least 10 mins before appt time to speak to provider

## 2018-11-26 NOTE — Patient Instructions (Signed)

## 2019-02-17 ENCOUNTER — Encounter: Payer: Self-pay | Admitting: Internal Medicine

## 2019-02-17 ENCOUNTER — Other Ambulatory Visit: Payer: Self-pay | Admitting: Internal Medicine

## 2019-02-17 DIAGNOSIS — N6310 Unspecified lump in the right breast, unspecified quadrant: Secondary | ICD-10-CM

## 2019-02-17 DIAGNOSIS — N644 Mastodynia: Secondary | ICD-10-CM

## 2019-03-14 ENCOUNTER — Other Ambulatory Visit: Payer: Self-pay

## 2019-03-14 ENCOUNTER — Ambulatory Visit
Admission: RE | Admit: 2019-03-14 | Discharge: 2019-03-14 | Disposition: A | Discharge status: Home / Self Care | Payer: BC Managed Care – PPO | Source: Ambulatory Visit | Attending: Internal Medicine | Admitting: Internal Medicine

## 2019-03-14 DIAGNOSIS — N644 Mastodynia: Secondary | ICD-10-CM

## 2019-03-14 DIAGNOSIS — R928 Other abnormal and inconclusive findings on diagnostic imaging of breast: Secondary | ICD-10-CM | POA: Diagnosis not present

## 2019-03-21 ENCOUNTER — Ambulatory Visit (INDEPENDENT_AMBULATORY_CARE_PROVIDER_SITE_OTHER): Payer: BC Managed Care – PPO | Admitting: Internal Medicine

## 2019-03-21 ENCOUNTER — Encounter: Payer: Self-pay | Admitting: Internal Medicine

## 2019-03-21 ENCOUNTER — Other Ambulatory Visit: Payer: Self-pay

## 2019-03-21 VITALS — BP 104/70 | HR 69 | Temp 98.3°F | Ht 60.2 in | Wt 245.8 lb

## 2019-03-21 DIAGNOSIS — L989 Disorder of the skin and subcutaneous tissue, unspecified: Secondary | ICD-10-CM

## 2019-03-21 DIAGNOSIS — L820 Inflamed seborrheic keratosis: Secondary | ICD-10-CM

## 2019-03-21 DIAGNOSIS — L821 Other seborrheic keratosis: Secondary | ICD-10-CM | POA: Diagnosis not present

## 2019-03-21 NOTE — Progress Notes (Signed)
Subjective:    [name], female    DOB: Aug 26, 1981, 38 y.o.   MRN: [id number]  HPI  Patient presents to the clinic today for complaints of a changing skin lesion to her back and new lesions to her right areola and left groin.  She reports the lesion on her back has been present for a long time but has changed in color and is now itching.  She reports it has changed from pink, to red, to white.  The lesion to her right areola and left groin have both appeared within the last month.  She denies any pain, bleeding, or itching with these lesions.She has positive family history of malignant skin neoplasms. She has not tried anything OTC for this.     Review of Systems      Past Medical History:  Diagnosis Date  . Allergy   . Asthma   . Complication of anesthesia    hard to wake up post-op  . 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 Depression   . Difficulty swallowing pills   . Migraine   . Multiple body piercings    nose and both ears - unsure if she can remove for surgery    Current Outpatient Medications  Medication Sig Dispense Refill  . albuterol (PROVENTIL HFA;VENTOLIN HFA) 108 (90 Base) MCG/ACT inhaler Inhale 2 puffs into the lungs every 6 (six) hours as needed for wheezing or shortness of breath. 1 Inhaler 2  . azithromycin (ZITHROMAX) 250 MG tablet Take 2 tabs today, then 1 tab daily x 4 days 6 tablet 0  . cetirizine (ZYRTEC) 10 MG tablet Take 10 mg by mouth daily.    . promethazine-dextromethorphan (PROMETHAZINE-DM) 6.25-15 MG/5ML syrup Take 5 mLs by mouth 4 (four) times daily as needed. 118 mL 0  . rizatriptan (MAXALT-MLT) 10 MG disintegrating tablet Take 1 tablet (10 mg total) by mouth as needed for migraine. May repeat in 2 hours if needed 10 tablet 0   No current facility-administered medications for this visit.     Allergies  Allergen Reactions  . Imitrex [Sumatriptan Succinate] Rash and Other (See Comments)    FEELS  LIKE SKIN IS BURNING  . Bupropion Other (See Comments)    CAUSED A PANIC ATTACK    Family History  Problem Relation Age of Onset  . Breast cancer Maternal Aunt   . Breast cancer Maternal Grandmother   . Heart disease Maternal Grandmother   . Hypertension Maternal Grandmother   . Cancer Paternal Grandmother        ovarian/uterine? unsure    Social History   Socioeconomic History  . Marital status: Legally Separated    Spouse name: Not on file  . Number of children: Not on file  . Years of education: Not on file  . Highest education level: Not on file  Occupational History  . Not on file  Social Needs  . Financial resource strain: Not on file  . Food insecurity    Worry: Not on file    Inability: Not on file  . Transportation needs    Medical: Not on file    Non-medical: Not on file  Tobacco Use  . Smoking status: Never Smoker  . Smokeless tobacco: Never Used  Substance and Sexual Activity  . Alcohol use: Yes    Comment: occasionally  . Drug use: No  . Sexual activity: Yes    Birth control/protection: Surgical  Lifestyle  .  Physical activity    Days per week: Not on file    Minutes per session: Not on file  . Stress: Not on file  Relationships  . Social Herbalist on phone: Not on file    Gets together: Not on file    Attends religious service: Not on file    Active member of club or organization: Not on file    Attends meetings of clubs or organizations: Not on file    Relationship status: Not on file  . Intimate partner violence    Fear of current or ex partner: Not on file    Emotionally abused: Not on file    Physically abused: Not on file    Forced sexual activity: Not on file  Other Topics Concern  . Not on file  Social History Narrative  . Not on file     Skin: Positive for old changing lesion to left upper back that itches.  Positive for 2 new lesions to right areola and left groin without itching, redness, pain.    No other  specific complaints in a complete review of systems (except as listed in HPI above).  Objective:   Physical Exam  BP 104/70 (BP Location: Left Arm, Patient Position: Sitting, Cuff Size: Normal)   Pulse 69   Temp 98.3 F (36.8 C) (Temporal)   Ht 5' 0.2" (1.529 m)   Wt 245 lb 12.8 oz (111.5 kg)   LMP 03/04/2019   SpO2 98%   BMI 47.69 kg/m   Wt Readings from Last 3 Encounters:  06/28/18 246 lb (111.6 kg)  05/21/18 244 lb (110.7 kg)  01/29/18 247 lb (112 kg)    General: Appears her stated age, obese, in NAD. Skin: Lesion to left upper back pea-sized, irregular shaped, and hypopigmented.  Right areola lesion appears slightly raised, oval shaped, and hyperpigmented. Left groin lesion slightly raised and scaly with irregular borders and uniform hyperpigmentation.  BMET    Component Value Date/Time   NA 138 07/05/2016 0535   NA 142 06/27/2016 1412   NA 139 10/22/2011 1515   K 3.9 07/05/2016 0535   K 4.0 10/22/2011 1515   K 4.0 10/22/2011 1515   CL 104 07/05/2016 0535   CL 107 10/22/2011 1515   CL 107 10/22/2011 1515   CO2 26 07/05/2016 0535   CO2 27 10/22/2011 1515   CO2 27 10/22/2011 1515   GLUCOSE 96 07/05/2016 0535   GLUCOSE 85 10/22/2011 1515   BUN 15 07/05/2016 0535   BUN 13 06/27/2016 1412   BUN 14 10/22/2011 1515   CREATININE 0.79 07/05/2016 0535   CREATININE 0.86 10/22/2011 1515   CREATININE 0.86 10/22/2011 1515   CALCIUM 9.1 07/05/2016 0535   CALCIUM 8.9 10/22/2011 1515   CALCIUM 8.9 10/22/2011 1515   GFRNONAA >60 07/05/2016 0535   GFRNONAA >60 10/22/2011 1515   GFRAA >60 07/05/2016 0535   GFRAA >60 10/22/2011 1515    Lipid Panel     Component Value Date/Time   CHOL 217 (H) 06/27/2016 1412   TRIG 276 (H) 06/27/2016 1412   HDL 37 (L) 06/27/2016 1412   CHOLHDL 5.9 (H) 06/27/2016 1412   LDLCALC 125 (H) 06/27/2016 1412    CBC    Component Value Date/Time   WBC 10.3 07/05/2016 0535   RBC 4.97 07/05/2016 0535   HGB 14.3 07/05/2016 0535   HGB 13.6  06/27/2016 1412   HCT 41.7 07/05/2016 0535   HCT 41.5 06/27/2016 1412  HCT 39 10/22/2011 1515   PLT 268 07/05/2016 0535   PLT 358 06/27/2016 1412   MCV 83.9 07/05/2016 0535   MCV 85 06/27/2016 1412   MCV 85 10/22/2011 1515   MCH 28.7 07/05/2016 0535   MCHC 34.3 07/05/2016 0535   RDW 13.8 07/05/2016 0535   RDW 14.0 06/27/2016 1412   RDW 13.6 10/22/2011 1515   LYMPHSABS 1.7 12/25/2008 2037   MONOABS 1.1 (H) 12/25/2008 2037   EOSABS 0.4 12/25/2008 2037   BASOSABS 0.0 12/25/2008 2037    Hgb A1C Lab Results  Component Value Date   HGBA1C 5.1 06/27/2016            Assessment & Plan:   Skin Lesion of Back:   Discussed risk of procedures including pain, bleeding, and infection with patient.  Informed consent obtained and scanned into chart.   Area cleansed with betadine x 2.   Area numbed with approximately 0.5 ml 1% Lidocaine with Epi.   Lesion excised with shave blade.   Area cauterized Antibiotic ointment applied, and bandage applied to wound.  Patient tolerated well.   No complications  Seborrheic Keratosis of Right Breast and Left Groin:   No intervention at this time.   Will monitor.    Webb Silversmith, NP

## 2019-03-21 NOTE — Patient Instructions (Signed)
Seborrheic Keratosis A seborrheic keratosis is a common, noncancerous (benign) skin growth. These growths are velvety, waxy, rough, tan, brown, or black spots that appear on the skin. These skin growths can be flat or raised, and scaly. What are the causes? The cause of this condition is not known. What increases the risk? You are more likely to develop this condition if you:  Have a family history of seborrheic keratosis.  Are 50 or older.  Are pregnant.  Have had estrogen replacement therapy. What are the signs or symptoms? Symptoms of this condition include growths on the face, chest, shoulders, back, or other areas. These growths:  Are usually painless, but may become irritated and itchy.  Can be yellow, brown, black, or other colors.  Are slightly raised or have a flat surface.  Are sometimes rough or wart-like in texture.  Are often velvety or waxy on the surface.  Are round or oval-shaped.  Often occur in groups, but may occur as a single growth. How is this diagnosed? This condition is diagnosed with a medical history and physical exam.  A sample of the growth may be tested (skin biopsy).  You may need to see a skin specialist (dermatologist). How is this treated? Treatment is not usually needed for this condition, unless the growths are irritated or bleed often.  You may also choose to have the growths removed if you do not like their appearance. ? Most commonly, these growths are treated with a procedure in which liquid nitrogen is applied to "freeze" off the growth (cryosurgery). ? They may also be burned off with electricity (electrocautery) or removed by scraping (curettage). Follow these instructions at home:  Watch your growth for any changes.  Keep all follow-up visits as told by your health care provider. This is important.  Do not scratch or pick at the growth or growths. This can cause them to become irritated or infected. Contact a health care  provider if:  You suddenly have many new growths.  Your growth bleeds, itches, or hurts.  Your growth suddenly becomes larger or changes color. Summary  A seborrheic keratosis is a common, noncancerous (benign) skin growth.  Treatment is not usually needed for this condition, unless the growths are irritated or bleed often.  Watch your growth for any changes.  Contact a health care provider if you suddenly have many new growths or your growth suddenly becomes larger or changes color.  Keep all follow-up visits as told by your health care provider. This is important. This information is not intended to replace advice given to you by your health care provider. Make sure you discuss any questions you have with your health care provider. Document Released: 09/27/2010 Document Revised: 01/07/2018 Document Reviewed: 01/07/2018 Elsevier Patient Education  2020 [employer]  

## 2019-06-13 ENCOUNTER — Encounter: Payer: Self-pay | Admitting: Internal Medicine

## 2019-08-16 ENCOUNTER — Other Ambulatory Visit: Payer: Self-pay

## 2019-08-16 ENCOUNTER — Ambulatory Visit (INDEPENDENT_AMBULATORY_CARE_PROVIDER_SITE_OTHER): Payer: BC Managed Care – PPO | Admitting: Internal Medicine

## 2019-08-16 ENCOUNTER — Encounter: Payer: Self-pay | Admitting: Internal Medicine

## 2019-08-16 VITALS — BP 106/72 | HR 73 | Temp 97.0°F | Wt 249.0 lb

## 2019-08-16 DIAGNOSIS — Z23 Encounter for immunization: Secondary | ICD-10-CM

## 2019-08-16 DIAGNOSIS — Z6841 Body Mass Index (BMI) 40.0 and over, adult: Secondary | ICD-10-CM | POA: Diagnosis not present

## 2019-08-16 DIAGNOSIS — M2669 Other specified disorders of temporomandibular joint: Secondary | ICD-10-CM | POA: Diagnosis not present

## 2019-08-16 DIAGNOSIS — M26621 Arthralgia of right temporomandibular joint: Secondary | ICD-10-CM | POA: Diagnosis not present

## 2019-08-16 DIAGNOSIS — Z01419 Encounter for gynecological examination (general) (routine) without abnormal findings: Secondary | ICD-10-CM | POA: Diagnosis not present

## 2019-08-16 MED ORDER — MELOXICAM 15 MG PO TABS: 15.0000 mg | ORAL_TABLET | Freq: Every day | ORAL | 0 refills | Status: DC

## 2019-08-16 NOTE — Progress Notes (Signed)
Subjective:    [name], female    DOB: 02-25-1981, 38 y.o.   MRN: [id number]  HPI  Pt presents to the clinic today with c/o right side jaw pain. This started over a year ago. She describes the pain as tight, achy and sharp with movement. She reports associated popping every morning, ear fullness and headaches on the right side of her head. She denies dizziness, visual changes, sensitivity to light or sound. She has seen the dentist for the same who advised her there was evidence of her grinding her teeth, consistent with TMJ. He advised her to buy a mouth guard that cost 600+. He did not feel like she needed referral to an oral surgeon at this time. She could not afford the mouth guard. She takes Ibuprofen daily with some relief.  Review of Systems      Past Medical History:  Diagnosis Date   Allergy    Asthma    Complication of anesthesia    hard to wake up post-op   Cyst of finger 07/2013   right long annular ligament cyst   Depression    Difficulty swallowing pills    Migraine    Multiple body piercings    nose and both ears - unsure if she can remove for surgery    Current Outpatient Medications  Medication Sig Dispense Refill   albuterol (PROVENTIL HFA;VENTOLIN HFA) 108 (90 Base) MCG/ACT inhaler Inhale 2 puffs into the lungs every 6 (six) hours as needed for wheezing or shortness of breath. 1 Inhaler 2   cetirizine (ZYRTEC) 10 MG tablet Take 10 mg by mouth daily.     rizatriptan (MAXALT-MLT) 10 MG disintegrating tablet Take 1 tablet (10 mg total) by mouth as needed for migraine. May repeat in 2 hours if needed 10 tablet 0   No current facility-administered medications for this visit.     Allergies  Allergen Reactions   Imitrex [Sumatriptan Succinate] Rash and Other (See Comments)    FEELS LIKE SKIN IS BURNING   Bupropion Other (See Comments)    CAUSED A PANIC ATTACK    Family History  Problem Relation Age of Onset   Breast  cancer Maternal Aunt    Breast cancer Maternal Grandmother    Heart disease Maternal Grandmother    Hypertension Maternal Grandmother    Cancer Paternal Grandmother        ovarian/uterine? unsure    Social History   Socioeconomic History   Marital status: Legally Separated    Spouse name: Not on file   Number of children: Not on file   Years of education: Not on file   Highest education level: Not on file  Occupational History   Not on file  Social Needs   Financial resource strain: Not on file   Food insecurity    Worry: Not on file    Inability: Not on file   Transportation needs    Medical: Not on file    Non-medical: Not on file  Tobacco Use   Smoking status: Never Smoker   Smokeless tobacco: Never Used  Substance and Sexual Activity   Alcohol use: Yes    Comment: occasionally   Drug use: No   Sexual activity: Yes    Birth control/protection: Surgical  Lifestyle   Physical activity    Days per week: Not on file    Minutes per session: Not on file   Stress: Not on file  Relationships   Social connections  Talks on phone: Not on file    Gets together: Not on file    Attends religious service: Not on file    Active member of club or organization: Not on file    Attends meetings of clubs or organizations: Not on file    Relationship status: Not on file   Intimate partner violence    Fear of current or ex partner: Not on file    Emotionally abused: Not on file    Physically abused: Not on file    Forced sexual activity: Not on file  Other Topics Concern   Not on file  Social History Narrative   Not on file     Constitutional: Pt reports headache. Denies fever, malaise, fatigue, or abrupt weight changes.  HEENT: Pt reports right ear fullness. Denies eye pain, eye redness, ear pain, ringing in the ears, wax buildup, runny nose, nasal congestion, bloody nose, or sore throat. Respiratory: Denies difficulty breathing, shortness of  breath, cough or sputum production.   Cardiovascular: Denies chest pain, chest tightness, palpitations or swelling in the hands or feet.  Musculoskeletal: Pt reports right side jaw pain, popping. Denies decrease in range of motion, difficulty with gait, muscle pain or joint swelling.  Neurological: Denies dizziness, sensitivity to light or sound.    No other specific complaints in a complete review of systems (except as listed in HPI above).  Objective:   Physical Exam  BP 106/72    Pulse 73    Temp (!) 97 F (36.1 C) (Temporal)    Wt 249 lb (112.9 kg)    SpO2 98%    BMI 48.31 kg/m   Wt Readings from Last 3 Encounters:  03/21/19 245 lb 12.8 oz (111.5 kg)  06/28/18 246 lb (111.6 kg)  05/21/18 244 lb (110.7 kg)    General: Appears her stated age, obese,  in NAD. Skin: Warm, dry and intact. No rashes noted. HEENT: Head: normal shape and size; Eyes: sclera white, no icterus, conjunctiva pink, PERRLA and EOMs intact; Ears: Tm's gray and intact, normal light reflex; Throat/Mouth: Teeth present, mucosa pink and moist, no exudate, lesions or ulcerations noted.  Cardiovascular: Normal rate and rhythm.  Pulmonary/Chest: Normal effort and positive vesicular breath sounds. No respiratory distress. No wheezes, rales or ronchi noted.  Musculoskeletal: Pain with palpation of the right TMJ. Obvious popping with opening and closing of the jaw.  Neurological: Alert and oriented. Coordination normal.    BMET    Component Value Date/Time   NA 138 07/05/2016 0535   NA 142 06/27/2016 1412   NA 139 10/22/2011 1515   K 3.9 07/05/2016 0535   K 4.0 10/22/2011 1515   K 4.0 10/22/2011 1515   CL 104 07/05/2016 0535   CL 107 10/22/2011 1515   CL 107 10/22/2011 1515   CO2 26 07/05/2016 0535   CO2 27 10/22/2011 1515   CO2 27 10/22/2011 1515   GLUCOSE 96 07/05/2016 0535   GLUCOSE 85 10/22/2011 1515   BUN 15 07/05/2016 0535   BUN 13 06/27/2016 1412   BUN 14 10/22/2011 1515   CREATININE 0.79  07/05/2016 0535   CREATININE 0.86 10/22/2011 1515   CREATININE 0.86 10/22/2011 1515   CALCIUM 9.1 07/05/2016 0535   CALCIUM 8.9 10/22/2011 1515   CALCIUM 8.9 10/22/2011 1515   GFRNONAA >60 07/05/2016 0535   GFRNONAA >60 10/22/2011 1515   GFRAA >60 07/05/2016 0535   GFRAA >60 10/22/2011 1515    Lipid Panel     Component  Value Date/Time   CHOL 217 (H) 06/27/2016 1412   TRIG 276 (H) 06/27/2016 1412   HDL 37 (L) 06/27/2016 1412   CHOLHDL 5.9 (H) 06/27/2016 1412   LDLCALC 125 (H) 06/27/2016 1412    CBC    Component Value Date/Time   WBC 10.3 07/05/2016 0535   RBC 4.97 07/05/2016 0535   HGB 14.3 07/05/2016 0535   HGB 13.6 06/27/2016 1412   HCT 41.7 07/05/2016 0535   HCT 41.5 06/27/2016 1412   HCT 39 10/22/2011 1515   PLT 268 07/05/2016 0535   PLT 358 06/27/2016 1412   MCV 83.9 07/05/2016 0535   MCV 85 06/27/2016 1412   MCV 85 10/22/2011 1515   MCH 28.7 07/05/2016 0535   MCHC 34.3 07/05/2016 0535   RDW 13.8 07/05/2016 0535   RDW 14.0 06/27/2016 1412   RDW 13.6 10/22/2011 1515   LYMPHSABS 1.7 12/25/2008 2037   MONOABS 1.1 (H) 12/25/2008 2037   EOSABS 0.4 12/25/2008 2037   BASOSABS 0.0 12/25/2008 2037    Hgb A1C Lab Results  Component Value Date   HGBA1C 5.1 06/27/2016         Assessment & Plan:   TMJ, Right:  Advised her to buy a mouth guard at the pharmacy or online RX for Meloxicam 15 mg PO QHS- avoid other NSAID's OTC Can take Tylenol 325 mg PO TID as needed Continue to follow up with your dentist, he can refer you to oral surgery when he feels this is necessary  Return precautions discussed Webb Silversmith, NP This visit occurred during the SARS-CoV-2 public health emergency.  Safety protocols were in place, including screening questions prior to the visit, additional usage of staff PPE, and extensive cleaning of exam room while observing appropriate contact time as indicated for disinfecting solutions.

## 2019-08-16 NOTE — Patient Instructions (Signed)

## 2019-09-12 ENCOUNTER — Other Ambulatory Visit: Payer: Self-pay | Admitting: Internal Medicine

## 2019-09-18 ENCOUNTER — Other Ambulatory Visit: Payer: Self-pay

## 2019-09-18 ENCOUNTER — Emergency Department (HOSPITAL_COMMUNITY)
Admission: EM | Admit: 2019-09-18 | Discharge: 2019-09-18 | Disposition: A | Discharge status: Home / Self Care | Payer: BC Managed Care – PPO | Attending: Emergency Medicine | Admitting: Emergency Medicine

## 2019-09-18 ENCOUNTER — Emergency Department (HOSPITAL_COMMUNITY): Payer: BC Managed Care – PPO

## 2019-09-18 ENCOUNTER — Encounter (HOSPITAL_COMMUNITY): Payer: Self-pay | Admitting: Emergency Medicine

## 2019-09-18 DIAGNOSIS — Z20822 Contact with and (suspected) exposure to covid-19: Secondary | ICD-10-CM | POA: Insufficient documentation

## 2019-09-18 DIAGNOSIS — Z79899 Other long term (current) drug therapy: Secondary | ICD-10-CM | POA: Insufficient documentation

## 2019-09-18 DIAGNOSIS — J45901 Unspecified asthma with (acute) exacerbation: Secondary | ICD-10-CM | POA: Diagnosis not present

## 2019-09-18 DIAGNOSIS — R0602 Shortness of breath: Secondary | ICD-10-CM | POA: Diagnosis not present

## 2019-09-18 DIAGNOSIS — R062 Wheezing: Secondary | ICD-10-CM | POA: Diagnosis not present

## 2019-09-18 MED ORDER — ALBUTEROL SULFATE HFA 108 (90 BASE) MCG/ACT IN AERS
2.0000 | INHALATION_SPRAY | Freq: Once | RESPIRATORY_TRACT | Status: AC
  Administered 2019-09-18: 2 via RESPIRATORY_TRACT
  Filled 2019-09-18: qty 6.7

## 2019-09-18 MED ORDER — ALBUTEROL SULFATE HFA 108 (90 BASE) MCG/ACT IN AERS: 2.0000 | INHALATION_SPRAY | Freq: Four times a day (QID) | RESPIRATORY_TRACT | 0 refills | Status: DC | PRN

## 2019-09-18 MED ORDER — PREDNISONE 20 MG PO TABS: 40.0000 mg | ORAL_TABLET | Freq: Every day | ORAL | 0 refills | Status: AC

## 2019-09-18 MED ORDER — PREDNISONE 20 MG PO TABS
40.0000 mg | ORAL_TABLET | Freq: Once | ORAL | Status: AC
  Administered 2019-09-18: 40 mg via ORAL
  Filled 2019-09-18: qty 2

## 2019-09-18 MED ORDER — IPRATROPIUM BROMIDE HFA 17 MCG/ACT IN AERS
2.0000 | INHALATION_SPRAY | Freq: Once | RESPIRATORY_TRACT | Status: AC
  Administered 2019-09-18: 2 via RESPIRATORY_TRACT
  Filled 2019-09-18: qty 12.9

## 2019-09-18 NOTE — ED Triage Notes (Signed)
Patient reports asthma attack onset yesterday with wheezing and occasional dry cough , she ran out of her inhaler , denies fever or chills .

## 2019-09-18 NOTE — Discharge Instructions (Signed)
Recommend continuing albuterol inhaler as discussed as needed for further wheezing.  Recommend taking steroids as prescribed.  Return to ER if you develop chest pain, difficulty breathing or other new concerning symptom.

## 2019-09-18 NOTE — ED Provider Notes (Signed)
[facility] EMERGENCY DEPARTMENT Provider Note   CSN: [id number] Arrival date & time: 09/18/19  [id number]     History Chief Complaint  Patient presents with  . Asthma    Deanna Thomas is a 39 y.o. female.  Presents to the emerge department chief complaint asthma, shortness of breath.  Patient states symptoms started approximately 3 or 4 days ago, noted some wheezing, has had some steadily increased shortness of breath, chest congestion.  Coughing, currently nonproductive.  Also noted some nasal congestion.  Currently not have any chest pain.  States that she had been using albuterol inhaler at home but this ran out and it was providing some relief.  States she has prior history of asthma, does not take any controlling medications, only albuterol as needed.  No fevers, no myalgias.  No known Covid contacts.  HPI     Past Medical History:  Diagnosis Date  . Allergy   . Asthma   . Complication of anesthesia    hard to wake up post-op  . 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 Depression   . Difficulty swallowing pills   . Migraine   . Multiple body piercings    nose and both ears - unsure if she can remove for surgery    Patient Active Problem List   Diagnosis Date Noted  . Plantar fasciitis, bilateral 04/17/2015  . CARPAL TUNNEL SYNDROME, BILATERAL 07/17/2008  . Migraine 11/05/2006  . Mild intermittent asthma with (acute) exacerbation 11/05/2006    Past Surgical History:  Procedure Laterality Date  . ABDOMINAL SURGERY     for ectopic pregnancy  . CESAREAN SECTION  02/28/3005; 01/04/2008  . ECTOPIC PREGNANCY SURGERY Left 01/28/2006   mini laparotomy; partial left salpingectomy  . MASS EXCISION Right 08/01/2013   Procedure: EXCISION MASS RIGHT LONG FINGER;  Surgeon: Tennis Must, MD;  Location: [place];  Service: Orthopedics;  Laterality: Right;  . TUBAL LIGATION Right 01/04/2008     OB History   No obstetric  history on file.     Family History  Problem Relation Age of Onset  . Breast cancer Maternal Aunt   . Breast cancer Maternal Grandmother   . Heart disease Maternal Grandmother   . Hypertension Maternal Grandmother   . Cancer Paternal Grandmother        ovarian/uterine? unsure    Social History   Tobacco Use  . Smoking status: Never Smoker  . Smokeless tobacco: Never Used  Substance Use Topics  . Alcohol use: Yes    Comment: occasionally  . Drug use: No    Home Medications Prior to Admission medications   Medication Sig Start Date End Date Taking? Authorizing Provider  Biotin w/ Vitamins C & E (HAIR SKIN & NAILS GUMMIES PO) Take 1 Dose by mouth daily.   Yes [provider]  ibuprofen (ADVIL) 200 MG tablet Take 200 mg by mouth every 6 (six) hours as needed for fever, headache or moderate pain.   Yes [provider]  loratadine (CLARITIN) 10 MG tablet Take 10 mg by mouth daily.   Yes [provider]  Multiple Vitamins-Minerals (MULTIVITAMIN GUMMIES ADULT PO) Take 1 Dose by mouth daily.   Yes [provider]  Noonday 28 0.25-35 MG-MCG tablet Take 1 tablet by mouth daily. 06/21/19  Yes [provider]  albuterol (VENTOLIN HFA) 108 (90 Base) MCG/ACT inhaler Inhale 2 puffs into the lungs every 6 (six) hours as  needed for wheezing or shortness of breath. 09/18/19   Lucrezia Starch, MD  meloxicam (MOBIC) 15 MG tablet Take 1 tablet (15 mg total) by mouth daily. MUST SCHEDULE PHYSICAL Patient not taking: Reported on 09/18/2019 09/13/19   Jearld Fenton, NP  predniSONE (DELTASONE) 20 MG tablet Take 2 tablets (40 mg total) by mouth daily for 4 days. 09/19/19 09/23/19  Lucrezia Starch, MD  rizatriptan (MAXALT-MLT) 10 MG disintegrating tablet Take 1 tablet (10 mg total) by mouth as needed for migraine. May repeat in 2 hours if needed Patient not taking: Reported on 09/18/2019 01/29/18   Venia Carbon, MD    Allergies    Imitrex [sumatriptan  succinate] and Bupropion  Review of Systems   Review of Systems  Constitutional: Negative for chills and fever.  HENT: Negative for ear pain and sore throat.   Eyes: Negative for pain and visual disturbance.  Respiratory: Positive for shortness of breath. Negative for cough.   Cardiovascular: Negative for chest pain and palpitations.  Gastrointestinal: Negative for abdominal pain and vomiting.  Genitourinary: Negative for dysuria and hematuria.  Musculoskeletal: Negative for arthralgias and back pain.  Skin: Negative for color change and rash.  Neurological: Negative for seizures and syncope.  All other systems reviewed and are negative.   Physical Exam Updated Vital Signs BP 140/72 (BP Location: Right Arm)   Pulse 88   Temp 98.2 F (36.8 C) (Oral)   Resp 16   LMP 09/12/2019   SpO2 97%   Physical Exam Vitals and nursing note reviewed.  Constitutional:      General: She is not in acute distress.    Appearance: She is well-developed.  HENT:     Head: Normocephalic and atraumatic.  Eyes:     Conjunctiva/sclera: Conjunctivae normal.  Cardiovascular:     Rate and Rhythm: Normal rate and regular rhythm.     Heart sounds: No murmur.  Pulmonary:     Effort: Pulmonary effort is normal. No respiratory distress.     Comments: Normal effort, no accessory muscle use, no tachypnea, and expiratory wheeze noted bilaterally, no crackles Abdominal:     Palpations: Abdomen is soft.     Tenderness: There is no abdominal tenderness.  Musculoskeletal:     Cervical back: Neck supple.  Skin:    General: Skin is warm and dry.     Capillary Refill: Capillary refill takes less than 2 seconds.  Neurological:     Mental Status: She is alert.  Psychiatric:        Mood and Affect: Mood normal.        Behavior: Behavior normal.     ED Results / Procedures / Treatments   Labs (all labs ordered are listed, but only abnormal results are displayed) Labs Reviewed  NOVEL CORONAVIRUS, NAA  (HOSP ORDER, SEND-OUT TO REF LAB; TAT 18-24 HRS)    EKG EKG Interpretation  Date/Time:  Sunday September 18 2019 08:12:28 EST Ventricular Rate:  82 PR Interval:    QRS Duration: 93 QT Interval:  394 QTC Calculation: 461 R Axis:     Text Interpretation: Sinus rhythm Low voltage, precordial leads LVH by voltage Confirmed by Madalyn Rob [phone]) on 09/18/2019 8:41:52 AM   Radiology DG Chest Portable 1 View  Result Date: 09/18/2019 CLINICAL DATA:  Shortness of breath EXAM: PORTABLE CHEST 1 VIEW COMPARISON:  July 05, 2016 FINDINGS: Lungs are clear. Heart size and pulmonary vascularity are normal. No adenopathy. No bone lesions. IMPRESSION: No edema or consolidation.  Cardiac silhouette normal. Electronically Signed   By: Lowella Grip III M.D.   On: 09/18/2019 08:14    Procedures Procedures (including critical care time)  Medications Ordered in ED Medications  predniSONE (DELTASONE) tablet 40 mg (40 mg Oral Given 09/18/19 0828)  albuterol (VENTOLIN HFA) 108 (90 Base) MCG/ACT inhaler 2 puff (2 puffs Inhalation Given 09/18/19 0827)  ipratropium (ATROVENT HFA) inhaler 2 puff (2 puffs Inhalation Given 09/18/19 0827)    ED Course  I have reviewed the triage vital signs and the nursing notes.  Pertinent labs & imaging results that were available during my care of the patient were reviewed by me and considered in my medical decision making (see chart for details).    MDM Rules/Calculators/A&P                      39 year old lady presents to ER with concern for asthma exacerbation.  On exam patient noted to be well-appearing, no distress, noted mild end expiratory wheeze bilaterally.  Symptoms improved after receiving further albuterol and ipratropium.  CXR without evidence for pneumonia.  Stable vital signs.  Appropriate for discharge and outpatient management at this time.  Reviewed return precautions, will discharge with Rx for albuterol, prednisone course.  Recommend recheck  with primary doctor this coming week.    After the discussed management above, the patient was determined to be safe for discharge.  The patient was in agreement with this plan and all questions regarding their care were answered.  ED return precautions were discussed and the patient will return to the ED with any significant worsening of condition.     Final Clinical Impression(s) / ED Diagnoses Final diagnoses:  Exacerbation of asthma, unspecified asthma severity, unspecified whether persistent    Rx / DC Orders ED Discharge Orders         Ordered    predniSONE (DELTASONE) 20 MG tablet  Daily     09/18/19 0842    albuterol (VENTOLIN HFA) 108 (90 Base) MCG/ACT inhaler  Every 6 hours PRN     09/18/19 0843           Lucrezia Starch, MD 09/18/19 [phone]

## 2019-09-19 ENCOUNTER — Telehealth: Payer: Self-pay

## 2019-09-19 LAB — NOVEL CORONAVIRUS, NAA (HOSP ORDER, SEND-OUT TO REF LAB; TAT 18-24 HRS): SARS-CoV-2, NAA: NOT DETECTED

## 2019-09-19 NOTE — Telephone Encounter (Signed)
Per chart review note pt was seen at [facility] ED on 09/18/19. Avie Echevaria NP is out of office will fwd to Avie Echevaria NP and Dr Silvio Pate who is in the office.

## 2019-09-19 NOTE — Telephone Encounter (Signed)
Can you please call to clarify. I can do virtual visit today if needed. But we are not doing breathing treatments in the office so if that is what she thinks she may need, she may need to go back to UC or ED.

## 2019-09-19 NOTE — Telephone Encounter (Signed)
[place] Night - Client TELEPHONE ADVICE RECORD [facility] Patient Name: Deanna Thomas Gender: Female DOB: May 20, 1981 Age: 39 Y 10 M 27 D Return Phone Number: [phone] (Primary) Address: City/State/Zip: [place] [address] Client [facility] Night - Client Client Site [place] Physician Webb Silversmith - NP Contact Type Call Who Is Calling Patient / Member / Family / Caregiver Call Type Triage / Clinical Relationship To Patient Self Return Phone Number Please choose phone number Chief Complaint BREATHING - shortness of breath or sounds breathless Reason for Call Symptomatic / Request for [place] states that they are calling about needing to get a breathing treatment as she is having shortness of breath and she is having asthma attacks. Not sure if she mean a nebulizer or not. Translation No Nurse Assessment Nurse: Melina Modena, RN, Estill Bamberg Date/Time Eilene Ghazi Time): 09/18/2019 5:50:16 AM Confirm and document reason for call. If symptomatic, describe symptoms. ---Caller states she is having an asthma attack. Has the patient had close contact with a person known or suspected to have the novel coronavirus illness OR traveled / lives in area with major community spread (including international travel) in the last 14 days from the onset of symptoms? * If Asymptomatic, screen for exposure and travel within the last 14 days. ---No Does the patient have any new or worsening symptoms? ---Yes Will a triage be completed? ---Yes Related visit to physician within the last 2 weeks? ---No Does the PT have any chronic conditions? (i.e. diabetes, asthma, this includes High risk factors for pregnancy, etc.) ---Yes List chronic conditions. ---asthma Is the patient pregnant or possibly pregnant? (Ask all females between the ages of 67-55) ---No Is this a behavioral health or  substance abuse call? ---No Guidelines Guideline Title Affirmed Question Affirmed Notes Nurse Date/Time (Eastern Time) Asthma Attack [1] MODERATE asthma attack (e.g., SOB at rest, speaks in phrases, audible [place], RN, Estill Bamberg 09/18/2019 5:51:15 AM PLEASE NOTE: All timestamps contained within this report are represented as [place] Standard Time. CONFIDENTIALTY NOTICE: This fax transmission is intended only for the addressee. It contains information that is legally privileged, confidential or otherwise protected from use or disclosure. If you are not the intended recipient, you are strictly prohibited from reviewing, disclosing, copying using or disseminating any of this information or taking any action in reliance on or regarding this information. If you have received this fax in error, please notify us immediately by telephone so that we can arrange for its return to [place]. Phone: [phone], Toll-Free: [phone], Fax: [phone] Page: 2 of 2 Call Id: [id number] Guidelines Guideline Title Affirmed Question Affirmed Notes Nurse Date/Time Eilene Ghazi Time) wheezes) AND [2] not resolved after 2 nebulizer or inhaler treatments given 20 minutes apart Disp. Time Eilene Ghazi Time) Disposition Final User 09/18/2019 5:49:04 AM Send to Urgent Guerry Bruin 09/18/2019 5:55:56 AM Go to ED Now (or PCP triage) Yes Melina Modena, RN, Shelly Coss Disagree/Comply Comply Caller Understands Yes PreDisposition Go to ED Care Advice Given Per Guideline GO TO ED NOW (OR PCP TRIAGE): BRING MEDICINES: CARE ADVICE given per Asthma Attack (Adult) guideline. Referrals [facility] - ED

## 2019-09-19 NOTE — Telephone Encounter (Signed)
Pt has already gone to ED

## 2020-02-28 DIAGNOSIS — Z6841 Body Mass Index (BMI) 40.0 and over, adult: Secondary | ICD-10-CM | POA: Diagnosis not present

## 2020-02-28 DIAGNOSIS — N898 Other specified noninflammatory disorders of vagina: Secondary | ICD-10-CM | POA: Diagnosis not present

## 2020-03-06 ENCOUNTER — Other Ambulatory Visit: Payer: Self-pay

## 2020-03-06 NOTE — Progress Notes (Signed)
[facility] corp specimen [id number]

## 2020-03-07 ENCOUNTER — Other Ambulatory Visit: Payer: Self-pay

## 2020-04-26 ENCOUNTER — Telehealth: Payer: Self-pay

## 2020-04-26 NOTE — Telephone Encounter (Signed)
Sick since late last week with cough and fever. Did not get tested for Covid. She is feeling better, but now is having severe leg discomfort. They "feel like static. Have to keep moving. Painful to touch". Started about 2 days ago. Keeps her awake. Using a weighted blanket on them. Asking what she should do. Please advise [phone].

## 2020-04-26 NOTE — Telephone Encounter (Signed)
If no appts available today or tomorrow, would recommend UC evaluation

## 2020-04-26 NOTE — Telephone Encounter (Signed)
Pt states she will got to UC, possibly the [facility] in [place] this afternoon

## 2020-06-13 ENCOUNTER — Other Ambulatory Visit: Payer: Self-pay | Admitting: Internal Medicine

## 2020-06-13 DIAGNOSIS — Z1231 Encounter for screening mammogram for malignant neoplasm of breast: Secondary | ICD-10-CM

## 2020-06-19 ENCOUNTER — Ambulatory Visit (INDEPENDENT_AMBULATORY_CARE_PROVIDER_SITE_OTHER)
Admission: RE | Admit: 2020-06-19 | Discharge: 2020-06-19 | Disposition: A | Discharge status: Home / Self Care | Payer: BC Managed Care – PPO | Source: Ambulatory Visit | Attending: Internal Medicine | Admitting: Internal Medicine

## 2020-06-19 ENCOUNTER — Ambulatory Visit (INDEPENDENT_AMBULATORY_CARE_PROVIDER_SITE_OTHER): Payer: BC Managed Care – PPO | Admitting: Internal Medicine

## 2020-06-19 ENCOUNTER — Encounter: Payer: Self-pay | Admitting: Internal Medicine

## 2020-06-19 ENCOUNTER — Other Ambulatory Visit: Payer: Self-pay

## 2020-06-19 VITALS — BP 126/84 | HR 78 | Temp 98.1°F | Wt 233.0 lb

## 2020-06-19 DIAGNOSIS — R1909 Other intra-abdominal and pelvic swelling, mass and lump: Secondary | ICD-10-CM | POA: Diagnosis not present

## 2020-06-19 DIAGNOSIS — R202 Paresthesia of skin: Secondary | ICD-10-CM

## 2020-06-19 DIAGNOSIS — Z23 Encounter for immunization: Secondary | ICD-10-CM | POA: Diagnosis not present

## 2020-06-19 DIAGNOSIS — M79644 Pain in right finger(s): Secondary | ICD-10-CM | POA: Diagnosis not present

## 2020-06-19 DIAGNOSIS — R2231 Localized swelling, mass and lump, right upper limb: Secondary | ICD-10-CM | POA: Diagnosis not present

## 2020-06-19 LAB — CBC WITH DIFFERENTIAL/PLATELET
Basophils Absolute: 0.1 10*3/uL (ref 0.0–0.1)
Basophils Relative: 0.8 % (ref 0.0–3.0)
Eosinophils Absolute: 0.4 10*3/uL (ref 0.0–0.7)
Eosinophils Relative: 4.9 % (ref 0.0–5.0)
HCT: 38.9 % (ref 36.0–46.0)
Hemoglobin: 12.9 g/dL (ref 12.0–15.0)
Lymphocytes Relative: 38.1 % (ref 12.0–46.0)
Lymphs Abs: 2.8 10*3/uL (ref 0.7–4.0)
MCHC: 33.1 g/dL (ref 30.0–36.0)
MCV: 83.2 fl (ref 78.0–100.0)
Monocytes Absolute: 0.8 10*3/uL (ref 0.1–1.0)
Monocytes Relative: 10.6 % (ref 3.0–12.0)
Neutro Abs: 3.4 10*3/uL (ref 1.4–7.7)
Neutrophils Relative %: 45.6 % (ref 43.0–77.0)
Platelets: 284 10*3/uL (ref 150.0–400.0)
RBC: 4.68 Mil/uL (ref 3.87–5.11)
RDW: 15.1 % (ref 11.5–15.5)
WBC: 7.5 10*3/uL (ref 4.0–10.5)

## 2020-06-19 LAB — BASIC METABOLIC PANEL
BUN: 14 mg/dL (ref 6–23)
CO2: 26 mEq/L (ref 19–32)
Calcium: 9.1 mg/dL (ref 8.4–10.5)
Chloride: 102 mEq/L (ref 96–112)
Creatinine, Ser: 0.65 mg/dL (ref 0.40–1.20)
GFR: 112.89 mL/min (ref >60.00)
Glucose, Bld: 85 mg/dL (ref 70–99)
Potassium: 4.7 mEq/L (ref 3.5–5.1)
Sodium: 137 mEq/L (ref 135–145)

## 2020-06-19 LAB — VITAMIN D 25 HYDROXY (VIT D DEFICIENCY, FRACTURES): VITD: 23.51 ng/mL — ABNORMAL LOW (ref 30.00–100.00)

## 2020-06-19 LAB — TSH: TSH: 2.86 u[IU]/mL (ref 0.35–4.50)

## 2020-06-19 LAB — VITAMIN B12: Vitamin B-12: 112 pg/mL — ABNORMAL LOW (ref 211–911)

## 2020-06-19 MED ORDER — METHYLPREDNISOLONE ACETATE 80 MG/ML IJ SUSP
80.0000 mg | Freq: Once | INTRAMUSCULAR | Status: AC
  Administered 2020-06-19: 80 mg via INTRAMUSCULAR

## 2020-06-19 NOTE — Progress Notes (Signed)
Subjective:    [name], female    DOB: 10-Oct-1980, 39 y.o.   MRN: [id number]  HPI  Patient presents the clinic today with complaint of numbness and tingling in her hands, R>L. This has been an ongoing issue but has worsened in the last 2 weeks. She reports her hands feel heavy in addition to the numbness and tingling. She has been wearing braces at night. She has not had nerve conduction studies. She has taken Ibuprofen OTC with minimal relief of symptoms.  She also has a mass at the base of her right wrist. She noticed this a long time ago. It was recently more swollen but reports it has since gone down.  She also reports a mass of her bellybutton. She noticed this a few months ago, after losing some weight. She notices tan discharge on her pants, but has not noticed any foul odor. She has not tried anything OTC for this.  Review of Systems      Past Medical History:  Diagnosis Date  . Allergy   . Asthma   . Complication of anesthesia    hard to wake up post-op  . 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 Depression   . Difficulty swallowing pills   . Migraine   . Multiple body piercings    nose and both ears - unsure if she can remove for surgery    Current Outpatient Medications  Medication Sig Dispense Refill  . albuterol (VENTOLIN HFA) 108 (90 Base) MCG/ACT inhaler Inhale 2 puffs into the lungs every 6 (six) hours as needed for wheezing or shortness of breath. 6.7 g 0  . Biotin w/ Vitamins C & E (HAIR SKIN & NAILS GUMMIES PO) Take 1 Dose by mouth daily.    Marland Kitchen ibuprofen (ADVIL) 200 MG tablet Take 200 mg by mouth every 6 (six) hours as needed for fever, headache or moderate pain.    Marland Kitchen loratadine (CLARITIN) 10 MG tablet Take 10 mg by mouth daily.    . meloxicam (MOBIC) 15 MG tablet Take 1 tablet (15 mg total) by mouth daily. MUST SCHEDULE PHYSICAL (Patient not taking: Reported on 09/18/2019) 30 tablet 0  . Multiple Vitamins-Minerals  (MULTIVITAMIN GUMMIES ADULT PO) Take 1 Dose by mouth daily.    . rizatriptan (MAXALT-MLT) 10 MG disintegrating tablet Take 1 tablet (10 mg total) by mouth as needed for migraine. May repeat in 2 hours if needed (Patient not taking: Reported on 09/18/2019) 10 tablet 0  . SPRINTEC 28 0.25-35 MG-MCG tablet Take 1 tablet by mouth daily.     No current facility-administered medications for this visit.    Allergies  Allergen Reactions  . Imitrex [Sumatriptan Succinate] Rash and Other (See Comments)    FEELS LIKE SKIN IS BURNING  . Bupropion Other (See Comments)    CAUSED A PANIC ATTACK    Family History  Problem Relation Age of Onset  . Breast cancer Maternal Aunt   . Breast cancer Maternal Grandmother   . Heart disease Maternal Grandmother   . Hypertension Maternal Grandmother   . Cancer Paternal Grandmother        ovarian/uterine? unsure    Social History   Socioeconomic History  . Marital status: Divorced    Spouse name: Not on file  . Number of children: Not on file  . Years of education: Not on file  . Highest education level: Not on file  Occupational History  . Not  on file  Tobacco Use  . Smoking status: Never Smoker  . Smokeless tobacco: Never Used  Substance and Sexual Activity  . Alcohol use: Yes    Comment: occasionally  . Drug use: No  . Sexual activity: Yes    Birth control/protection: Surgical  Other Topics Concern  . Not on file  Social History Narrative  . Not on file   Social Determinants of Health   Financial Resource Strain:   . Difficulty of Paying Living Expenses: Not on file  Food Insecurity:   . Worried About Charity fundraiser in the Last Year: Not on file  . Ran Out of Food in the Last Year: Not on file  Transportation Needs:   . Lack of Transportation (Medical): Not on file  . Lack of Transportation (Non-Medical): Not on file  Physical Activity:   . Days of Exercise per Week: Not on file  . Minutes of Exercise per Session: Not on file    Stress:   . Feeling of Stress : Not on file  Social Connections:   . Frequency of Communication with Friends and Family: Not on file  . Frequency of Social Gatherings with Friends and Family: Not on file  . Attends Religious Services: Not on file  . Active Member of Clubs or Organizations: Not on file  . Attends Archivist Meetings: Not on file  . Marital Status: Not on file  Intimate Partner Violence:   . Fear of Current or Ex-Partner: Not on file  . Emotionally Abused: Not on file  . Physically Abused: Not on file  . Sexually Abused: Not on file     Constitutional: Denies fever, malaise, fatigue, headache or abrupt weight changes.  Respiratory: Denies difficulty breathing, shortness of breath, cough or sputum production.   Cardiovascular: Denies chest pain, chest tightness, palpitations or swelling in the hands or feet.  Musculoskeletal: Denies decrease in range of motion, difficulty with gait, muscle pain or joint pain and swelling.  Skin: Pt reports mass of belly button. Denies redness, rashes, or ulcercations.  Neurological: Patient reports numbness and tingling of hands.  Denies dizziness, difficulty with memory, difficulty with speech or problems with balance and coordination.    No other specific complaints in a complete review of systems (except as listed in HPI above).  Objective:   Physical Exam   BP 126/84   Pulse 78   Temp 98.1 F (36.7 C) (Temporal)   Wt 233 lb (105.7 kg)   SpO2 98%   BMI 45.20 kg/m   Wt Readings from Last 3 Encounters:  08/16/19 249 lb (112.9 kg)  03/21/19 245 lb 12.8 oz (111.5 kg)  06/28/18 246 lb (111.6 kg)    General: Appears her stated age, obese, in NAD. Skin: Cauliflower pedunculated mass noted at the center of the bellybutton. 0.5 cm mass of the right medial wrist. Cardiovascular: Normal rate. Radial pulses 2+ bilaterally. Pulmonary/Chest: Normal effort and positive vesicular breath sounds. No respiratory distress. No  wheezes, rales or ronchi noted.  Musculoskeletal: Normal flexion, extension and rotation of the elbows. Normal flexion, extension and rotation of the wrist. Hand grips equal. Strength 5/5 BUE. Neurological: Alert and oriented. Positive Phalen's. Positive Tinel's. Coordination normal.    BMET    Component Value Date/Time   NA 138 07/05/2016 0535   NA 142 06/27/2016 1412   NA 139 10/22/2011 1515   K 3.9 07/05/2016 0535   K 4.0 10/22/2011 1515   K 4.0 10/22/2011 1515  CL 104 07/05/2016 0535   CL 107 10/22/2011 1515   CL 107 10/22/2011 1515   CO2 26 07/05/2016 0535   CO2 27 10/22/2011 1515   CO2 27 10/22/2011 1515   GLUCOSE 96 07/05/2016 0535   GLUCOSE 85 10/22/2011 1515   BUN 15 07/05/2016 0535   BUN 13 06/27/2016 1412   BUN 14 10/22/2011 1515   CREATININE 0.79 07/05/2016 0535   CREATININE 0.86 10/22/2011 1515   CREATININE 0.86 10/22/2011 1515   CALCIUM 9.1 07/05/2016 0535   CALCIUM 8.9 10/22/2011 1515   CALCIUM 8.9 10/22/2011 1515   GFRNONAA >60 07/05/2016 0535   GFRNONAA >60 10/22/2011 1515   GFRAA >60 07/05/2016 0535   GFRAA >60 10/22/2011 1515    Lipid Panel     Component Value Date/Time   CHOL 217 (H) 06/27/2016 1412   TRIG 276 (H) 06/27/2016 1412   HDL 37 (L) 06/27/2016 1412   CHOLHDL 5.9 (H) 06/27/2016 1412   LDLCALC 125 (H) 06/27/2016 1412    CBC    Component Value Date/Time   WBC 10.3 07/05/2016 0535   RBC 4.97 07/05/2016 0535   HGB 14.3 07/05/2016 0535   HGB 13.6 06/27/2016 1412   HCT 41.7 07/05/2016 0535   HCT 41.5 06/27/2016 1412   HCT 39 10/22/2011 1515   PLT 268 07/05/2016 0535   PLT 358 06/27/2016 1412   MCV 83.9 07/05/2016 0535   MCV 85 06/27/2016 1412   MCV 85 10/22/2011 1515   MCH 28.7 07/05/2016 0535   MCHC 34.3 07/05/2016 0535   RDW 13.8 07/05/2016 0535   RDW 14.0 06/27/2016 1412   RDW 13.6 10/22/2011 1515   LYMPHSABS 1.7 12/25/2008 2037   MONOABS 1.1 (H) 12/25/2008 2037   EOSABS 0.4 12/25/2008 2037   BASOSABS 0.0 12/25/2008 2037     Hgb A1C Lab Results  Component Value Date   HGBA1C 5.1 06/27/2016           Assessment & Plan:   Paresthesia of RUE:  Xray right hand today Will check CBC, BMET, TSH, Vit D and B12 Referral to neurology for EMG testing Continue to wear your brace at night 80 mg Depo IM today  Mass of Bellybutton:  ? Pyogenic granuloma Referral to dermatology for further evaluation and treatment  Will follow up after labs and xray, return precautions discussed Webb Silversmith, NP This visit occurred during the SARS-CoV-2 public health emergency.  Safety protocols were in place, including screening questions prior to the visit, additional usage of staff PPE, and extensive cleaning of exam room while observing appropriate contact time as indicated for disinfecting solutions.

## 2020-06-19 NOTE — Addendum Note (Signed)
Addended by: Lurlean Nanny on: 06/19/2020 12:59 PM   Modules accepted: Orders

## 2020-06-19 NOTE — Patient Instructions (Signed)
Paresthesia Paresthesia is a burning or prickling feeling. This feeling can happen in any part of the body. It often happens in the hands, arms, legs, or feet. Usually, it is not painful. In most cases, the feeling goes away in a short time and is not a sign of a serious problem. If you have paresthesia that lasts a long time, you may need to be seen by your doctor. Follow these instructions at home: Alcohol use   Do not drink alcohol if: ? Your doctor tells you not to drink. ? You are pregnant, may be pregnant, or are planning to become pregnant.  If you drink alcohol: ? Limit how much you use to:  0-1 drink a day for women.  0-2 drinks a day for men. ? Be aware of how much alcohol is in your drink. In the [place] one drink equals one 12 oz bottle of beer (355 mL), one 5 oz glass of wine (148 mL), or one 1 oz glass of hard liquor (44 mL). Nutrition   Eat a healthy diet. This includes: ? Eating foods that have a lot of fiber in them, such as fresh fruits and vegetables, whole grains, and beans. ? Limiting foods that have a lot of fat and processed sugars in them, such as fried or sweet foods. General instructions  Take over-the-counter and prescription medicines only as told by your doctor.  Do not use any products that have nicotine or tobacco in them, such as cigarettes and e-cigarettes. If you need help quitting, ask your doctor.  If you have diabetes, work with your doctor to make sure your blood sugar stays in a healthy range.  If your feet feel numb: ? Check for redness, warmth, and swelling every day. ? Wear padded socks and comfortable shoes. These help protect your feet.  Keep all follow-up visits as told by your doctor. This is important. Contact a doctor if:  You have paresthesia that gets worse or does not go away.  Your burning or prickling feeling gets worse when you walk.  You have pain or cramps.  You feel dizzy.  You have a rash. Get help right away if  you:  Feel weak.  Have trouble walking or moving.  Have problems speaking, understanding, or seeing.  Feel confused.  Cannot control when you pee (urinate) or poop (have a bowel movement).  Lose feeling (have numbness) after an injury.  Have new weakness in an arm or leg.  Pass out (faint). Summary  Paresthesia is a burning or prickling feeling. It often happens in the hands, arms, legs, or feet.  In most cases, the feeling goes away in a short time and is not a sign of a serious problem.  If you have paresthesia that lasts a long time, you may need to be seen by your doctor. This information is not intended to replace advice given to you by your health care provider. Make sure you discuss any questions you have with your health care provider. Document Revised: 09/20/2018 Document Reviewed: 09/03/2017 Elsevier Patient Education  2020 Elsevier Inc.  

## 2020-06-22 ENCOUNTER — Encounter: Payer: Self-pay | Admitting: Internal Medicine

## 2020-06-27 DIAGNOSIS — R200 Anesthesia of skin: Secondary | ICD-10-CM | POA: Diagnosis not present

## 2020-06-28 ENCOUNTER — Encounter: Payer: Self-pay | Admitting: Internal Medicine

## 2020-07-02 ENCOUNTER — Telehealth: Payer: Self-pay | Admitting: Internal Medicine

## 2020-07-02 NOTE — Telephone Encounter (Signed)
Deanna Thomas @ [facility]  Dr Manuella Ghazi would like you to call him     Best number [phone]

## 2020-07-03 NOTE — Telephone Encounter (Signed)
Attempted to reach Dr. Manuella Ghazi. Will try again later.

## 2020-07-03 NOTE — Telephone Encounter (Signed)
Pt has already seen neurology

## 2020-07-04 ENCOUNTER — Telehealth: Payer: Self-pay | Admitting: Internal Medicine

## 2020-07-04 NOTE — Telephone Encounter (Signed)
Provider assistant (rebecca) called from [place] he wanted to talk about Deanna Thomas and the next steps.  Best contact number [phone]

## 2020-07-04 NOTE — Telephone Encounter (Signed)
Left message for call back. Based off study, she has mild disease which would likely indicate bracing and antiinflammatories, not surgical intervention. Will follow.

## 2020-07-06 ENCOUNTER — Ambulatory Visit
Admission: RE | Admit: 2020-07-06 | Discharge: 2020-07-06 | Disposition: A | Discharge status: Home / Self Care | Payer: BC Managed Care – PPO | Source: Ambulatory Visit | Attending: Internal Medicine | Admitting: Internal Medicine

## 2020-07-06 ENCOUNTER — Other Ambulatory Visit: Payer: Self-pay

## 2020-07-06 DIAGNOSIS — Z1231 Encounter for screening mammogram for malignant neoplasm of breast: Secondary | ICD-10-CM | POA: Diagnosis not present

## 2020-08-14 DIAGNOSIS — R200 Anesthesia of skin: Secondary | ICD-10-CM | POA: Diagnosis not present

## 2020-08-14 DIAGNOSIS — E559 Vitamin D deficiency, unspecified: Secondary | ICD-10-CM | POA: Diagnosis not present

## 2020-08-14 DIAGNOSIS — Z131 Encounter for screening for diabetes mellitus: Secondary | ICD-10-CM | POA: Diagnosis not present

## 2020-08-14 DIAGNOSIS — E519 Thiamine deficiency, unspecified: Secondary | ICD-10-CM | POA: Diagnosis not present

## 2020-08-14 DIAGNOSIS — G5601 Carpal tunnel syndrome, right upper limb: Secondary | ICD-10-CM | POA: Diagnosis not present

## 2020-08-14 DIAGNOSIS — Z7189 Other specified counseling: Secondary | ICD-10-CM | POA: Diagnosis not present

## 2020-08-14 DIAGNOSIS — E531 Pyridoxine deficiency: Secondary | ICD-10-CM | POA: Diagnosis not present

## 2020-08-14 DIAGNOSIS — E538 Deficiency of other specified B group vitamins: Secondary | ICD-10-CM | POA: Diagnosis not present

## 2020-08-17 DIAGNOSIS — G5601 Carpal tunnel syndrome, right upper limb: Secondary | ICD-10-CM | POA: Diagnosis not present

## 2020-08-20 DIAGNOSIS — Z01419 Encounter for gynecological examination (general) (routine) without abnormal findings: Secondary | ICD-10-CM | POA: Diagnosis not present

## 2020-09-03 DIAGNOSIS — Z79899 Other long term (current) drug therapy: Secondary | ICD-10-CM | POA: Diagnosis not present

## 2020-09-19 ENCOUNTER — Encounter: Payer: Self-pay | Admitting: Internal Medicine

## 2020-10-04 ENCOUNTER — Encounter: Payer: Self-pay | Admitting: Physician Assistant

## 2020-10-04 ENCOUNTER — Emergency Department
Admission: EM | Admit: 2020-10-04 | Discharge: 2020-10-04 | Disposition: A | Discharge status: Home / Self Care | Payer: BC Managed Care – PPO | Attending: Emergency Medicine | Admitting: Emergency Medicine

## 2020-10-04 ENCOUNTER — Other Ambulatory Visit: Payer: Self-pay

## 2020-10-04 ENCOUNTER — Emergency Department: Payer: BC Managed Care – PPO

## 2020-10-04 DIAGNOSIS — J4520 Mild intermittent asthma, uncomplicated: Secondary | ICD-10-CM | POA: Insufficient documentation

## 2020-10-04 DIAGNOSIS — R55 Syncope and collapse: Secondary | ICD-10-CM | POA: Insufficient documentation

## 2020-10-04 DIAGNOSIS — G4453 Primary thunderclap headache: Secondary | ICD-10-CM | POA: Diagnosis not present

## 2020-10-04 DIAGNOSIS — R519 Headache, unspecified: Secondary | ICD-10-CM | POA: Diagnosis not present

## 2020-10-04 DIAGNOSIS — R040 Epistaxis: Secondary | ICD-10-CM | POA: Insufficient documentation

## 2020-10-04 DIAGNOSIS — R42 Dizziness and giddiness: Secondary | ICD-10-CM | POA: Insufficient documentation

## 2020-10-04 LAB — BASIC METABOLIC PANEL
Anion gap: 13 (ref 5–15)
BUN: 17 mg/dL (ref 6–20)
CO2: 21 mmol/L — ABNORMAL LOW (ref 22–32)
Calcium: 8.9 mg/dL (ref 8.9–10.3)
Chloride: 103 mmol/L (ref 98–111)
Creatinine, Ser: 0.74 mg/dL (ref 0.44–1.00)
GFR, Estimated: >60 mL/min (ref >60)
Glucose, Bld: 100 mg/dL — ABNORMAL HIGH (ref 70–99)
Potassium: 5.1 mmol/L (ref 3.5–5.1)
Sodium: 137 mmol/L (ref 135–145)

## 2020-10-04 LAB — CBC
HCT: 41.1 % (ref 36.0–46.0)
Hemoglobin: 13.4 g/dL (ref 12.0–15.0)
MCH: 27.6 pg (ref 26.0–34.0)
MCHC: 32.6 g/dL (ref 30.0–36.0)
MCV: 84.7 fL (ref 80.0–100.0)
Platelets: 349 10*3/uL (ref 150–400)
RBC: 4.85 MIL/uL (ref 3.87–5.11)
RDW: 13.4 % (ref 11.5–15.5)
WBC: 12.3 10*3/uL — ABNORMAL HIGH (ref 4.0–10.5)
nRBC: 0 % (ref 0.0–0.2)

## 2020-10-04 MED ORDER — ONDANSETRON 4 MG PO TBDP: 4.0000 mg | ORAL_TABLET | Freq: Three times a day (TID) | ORAL | 0 refills | Status: DC | PRN

## 2020-10-04 NOTE — ED Notes (Signed)
Pt reports she might be dehydrated. Had a few sips of OJ and tea PTA, with nausea but no vomiting.   Pt reports some weakness with ambulation. Reports that she felt "like I may have had a panic attack, or was going to." on scene. Felt like her necklace was choking her, feeling of "something is wrong with me," temperature intolerance.

## 2020-10-04 NOTE — ED Provider Notes (Signed)
[facility] Emergency Department Provider Note  ____________________________________________   None    (approximate)  I have reviewed the triage vital signs and the nursing notes.   HISTORY  Chief Complaint Headache and Epistaxis  HPI Deanna Thomas is a 40 y.o. female sudden onset of sharp, intense, fleeting head/headache pain at about 1 pm. This was followed by a spontaneous, unprovoked nosebleed.  She describes a sudden pain sharp in nature across the top of the head, that was electrical in nature.  She has associated nausea w/o vomiting, dizziness, and near syncope. She reports resolution of the headache, but continues to note "feeling tense" and the floor appears "wavy."  She gives a history of migraines, previously treated with Imitrex.  She also reports Fronek sinusitis, but denies any serious copious nosebleed since childhood.  She denies any vision loss, weakness, syncope, or ongoing headache pain.  She dosed ibuprofen after onset of symptoms and denies any current headache pain.  She denies any family history of aneurysm or intracranial hemorrhage.     Past Medical History:  Diagnosis Date  . Allergy   . Asthma   . Complication of anesthesia    hard to wake up post-op  . 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 Depression   . Difficulty swallowing pills   . Migraine   . Multiple body piercings    nose and both ears - unsure if she can remove for surgery    Patient Active Problem List   Diagnosis Date Noted  . Plantar fasciitis, bilateral 04/17/2015  . CARPAL TUNNEL SYNDROME, BILATERAL 07/17/2008  . Migraine 11/05/2006  . Mild intermittent asthma with (acute) exacerbation 11/05/2006    Past Surgical History:  Procedure Laterality Date  . ABDOMINAL SURGERY     for ectopic pregnancy  . CESAREAN SECTION  02/28/3005; 01/04/2008  . ECTOPIC PREGNANCY SURGERY Left 01/28/2006   mini laparotomy; partial left salpingectomy   . MASS EXCISION Right 08/01/2013   Procedure: EXCISION MASS RIGHT LONG FINGER;  Surgeon: Tennis Must, MD;  Location: [facility];  Service: Orthopedics;  Laterality: Right;  . TUBAL LIGATION Right 01/04/2008    Prior to Admission medications   Medication Sig Start Date End Date Taking? Authorizing Provider  ondansetron (ZOFRAN ODT) 4 MG disintegrating tablet Take 1 tablet (4 mg total) by mouth every 8 (eight) hours as needed. 10/04/20  Yes Shandrell Boda, Dannielle Karvonen, PA-C  albuterol (VENTOLIN HFA) 108 (90 Base) MCG/ACT inhaler Inhale 2 puffs into the lungs every 6 (six) hours as needed for wheezing or shortness of breath. 09/18/19   Lucrezia Starch, MD  ibuprofen (ADVIL) 200 MG tablet Take 200 mg by mouth every 6 (six) hours as needed for fever, headache or moderate pain.    [provider]  loratadine (CLARITIN) 10 MG tablet Take 10 mg by mouth daily.    [provider]  [place] 28 0.25-35 MG-MCG tablet Take 1 tablet by mouth daily. 06/21/19   [provider]    Allergies Imitrex [sumatriptan succinate] and Bupropion  Family History  Problem Relation Age of Onset  . Breast cancer Maternal Aunt   . Breast cancer Maternal Grandmother   . Heart disease Maternal Grandmother   . Hypertension Maternal Grandmother   . Cancer Paternal Grandmother        ovarian/uterine? unsure    Social History Social History   Tobacco Use  . Smoking status: Never Smoker  .  Smokeless tobacco: Never Used  Substance Use Topics  . Alcohol use: Yes    Comment: occasionally  . Drug use: No    Review of Systems  Constitutional: No fever/chills Eyes: No visual changes. ENT: No sore throat. Reports spontaneous nosebleed Cardiovascular: Denies chest pain. Respiratory: Denies shortness of breath. Gastrointestinal: No abdominal pain.  No nausea, no vomiting.  No diarrhea.  No constipation. Genitourinary: Negative for dysuria. Musculoskeletal: Negative for back  pain. Skin: Negative for rash. Neurological: Negative focal weakness or numbness. Reports resolved headache ____________________________________________   PHYSICAL EXAM:  VITAL SIGNS: ED Triage Vitals  Enc Vitals Group     BP 10/04/20 1424 126/82     Pulse Rate 10/04/20 1424 76     Resp 10/04/20 1424 16     Temp 10/04/20 1424 98.3 F (36.8 C)     Temp Source 10/04/20 1424 Oral     SpO2 10/04/20 1424 99 %     Weight 10/04/20 1425 252 lb (114.3 kg)     Height 10/04/20 1425 5' (1.524 m)     Head Circumference --      Peak Flow --      Pain Score 10/04/20 1428 0     Pain Loc --      Pain Edu? --      Excl. in [place]? --    Constitutional: Alert and oriented. Well appearing and in no acute distress. Eyes: Conjunctivae are normal. PERRL. EOMI. normal fundi bilaterally. Head: Atraumatic. Nose: No congestion/rhinnorhea. No active nosebleed.  Mouth/Throat: Mucous membranes are moist.  Oropharynx non-erythematous. Neck: No stridor.  No cervical spine tenderness to palpation.  Cardiovascular: Normal rate, regular rhythm. Grossly normal heart sounds.  Good peripheral circulation. Respiratory: Normal respiratory effort.  No retractions. Lungs CTAB. Gastrointestinal: Soft and nontender. No distention. No abdominal bruits. No CVA tenderness. Musculoskeletal: No lower extremity tenderness nor edema.  No joint effusions. Neurologic: CN II-XII grossly intact.  No cerebellar ataxia noted. Normal speech and language. No gross focal neurologic deficits are appreciated. No gait instability. Skin:  Skin is warm, dry and intact. No rash noted. Psychiatric: Mood and affect are normal. Speech and behavior are normal. ____________________________________________   LABS (all labs ordered are listed, but only abnormal results are displayed)  Labs Reviewed  CBC - Abnormal; Notable for the following components:      Result Value   WBC 12.3 (*)    All other components within normal limits  BASIC  METABOLIC PANEL - Abnormal; Notable for the following components:   CO2 21 (*)    Glucose, Bld 100 (*)    All other components within normal limits  ____________________________________________  RADIOLOGY I, Melvenia Needles, personally viewed and evaluated these images (plain radiographs) as part of my medical decision making, as well as reviewing the written report by the radiologist.  ED MD interpretation:  See CT report  Official radiology report(s): CT Head Wo Contrast  Result Date: 10/04/2020 CLINICAL DATA:  Severe headache EXAM: CT HEAD WITHOUT CONTRAST TECHNIQUE: Contiguous axial images were obtained from the base of the skull through the vertex without intravenous contrast. COMPARISON:  07/24/2015 FINDINGS: Brain: No acute intracranial abnormality. Specifically, no hemorrhage, hydrocephalus, mass lesion, acute infarction, or significant intracranial injury. Vascular: No hyperdense vessel or unexpected calcification. Skull: No acute calvarial abnormality. Sinuses/Orbits: No acute findings Other: None IMPRESSION: Normal study. Electronically Signed   By: Rolm Baptise M.D.   On: 10/04/2020 17:14    ____________________________________________   PROCEDURES  Procedure(s) performed (  including Critical Care):  Procedures  ____________________________________________   INITIAL IMPRESSION / ASSESSMENT AND PLAN / ED COURSE  As part of my medical decision making, I reviewed the following data within the [place] reviewed, Notes from prior ED visits and [place] Controlled Substance Database    Differential diagnosis includes, but is not limited to, intracranial hemorrhage, meningitis/encephalitis, previous head trauma, cavernous venous thrombosis, tension headache, temporal arteritis, migraine or migraine equivalent, idiopathic intracranial hypertension, and non-specific headache.  Patient with ED evaluation of sudden onset thunderclap headache concerning for  possible subarachnoid hemorrhage.  Exam is overall benign reassuring at this time.  No cerebellar ataxia or acute neuromuscular deficits noted.  Patient CT scan is negative for any acute intracranial bleed.  She is reassured by her exam prior to CT imaging results.  She will follow with primary provider for ongoing symptoms.  Return precautions have been discussed but work note was provided as discussed.   ____________________________________________   FINAL CLINICAL IMPRESSION(S) / ED DIAGNOSES  Final diagnoses:  Thunderclap headache  Anterior epistaxis     ED Discharge Orders         Ordered    ondansetron (ZOFRAN ODT) 4 MG disintegrating tablet  Every 8 hours PRN        10/04/20 1726          *Please note:  Deanna Thomas was evaluated in Emergency Department on 10/04/2020 for the symptoms described in the history of present illness. She was evaluated in the context of the global COVID-19 pandemic, which necessitated consideration that the patient might be at risk for infection with the SARS-CoV-2 virus that causes COVID-19. Institutional protocols and algorithms that pertain to the evaluation of patients at risk for COVID-19 are in a state of rapid change based on information released by regulatory bodies including the CDC and federal and state organizations. These policies and algorithms were followed during the patient's care in the ED.  Some ED evaluations and interventions may be delayed as a result of limited staffing during and the pandemic.*   Note:  This document was prepared using Dragon voice recognition software and may include unintentional dictation errors.    Melvenia Needles, PA-C 10/04/20 2347    Naaman Plummer, MD 10/05/20 1900

## 2020-10-04 NOTE — ED Notes (Signed)
Pt reports at work at approx 1300, she had a sudden onset of sharp frontal headache with nausea/dizziness, hot flash, and nosebleed almost immediately. Pt reports "a lot of bleeding, pouring." From R nare only. Passing several clots. Bleeding sustained for "a few minutes."   Pt reports taking "2 advil dual action" while nose was bleeding. Pt felt shaky, anxious, some difficulty remembering full chain of events. Denies changes to vision, denies significant sensitivity to light/sound.   Reports currently feeling "off," feeling of generalized head pressure. Denies nausea currently.   Pt reports she was "a kid the last time I had a nosebleed." and that she "had to have my blood vessels in my nose frozen as a kid."   No bleeding at this time or since arrival  Pt with normal neuro screening. Notes that initially, she states she probably could not have read normally.

## 2020-10-04 NOTE — ED Triage Notes (Signed)
Pt here with a sharp pain in her head that started today. Pt also had a nose bleed. Pt is dizzy. She took advil given to her by her co-workers. Pt states that she is shaky and it feel different. Pt in NAD in triage.

## 2020-10-04 NOTE — ED Triage Notes (Signed)
First Nurse Note:  Arrives with spouse.  C/O feeling hot at work and almost passing out.  Also had a blood nose.  Patient arrives AAOx3.  No epistaxis noted.

## 2020-10-31 ENCOUNTER — Ambulatory Visit: Payer: BC Managed Care – PPO | Admitting: Dermatology

## 2021-01-15 DIAGNOSIS — E559 Vitamin D deficiency, unspecified: Secondary | ICD-10-CM | POA: Diagnosis not present

## 2021-01-15 DIAGNOSIS — G5601 Carpal tunnel syndrome, right upper limb: Secondary | ICD-10-CM | POA: Diagnosis not present

## 2021-01-15 DIAGNOSIS — M6740 Ganglion, unspecified site: Secondary | ICD-10-CM | POA: Diagnosis not present

## 2021-01-15 DIAGNOSIS — E538 Deficiency of other specified B group vitamins: Secondary | ICD-10-CM | POA: Diagnosis not present

## 2021-02-15 ENCOUNTER — Encounter: Payer: Self-pay | Admitting: Internal Medicine

## 2021-02-15 DIAGNOSIS — G5603 Carpal tunnel syndrome, bilateral upper limbs: Secondary | ICD-10-CM

## 2021-02-18 ENCOUNTER — Telehealth: Payer: Self-pay

## 2021-02-18 NOTE — Telephone Encounter (Signed)
Copied from [place] [id number]. Topic: General - Other >> Feb 15, 2021  3:46 PM Pawlus, Brayton Layman A wrote: Reason for CRM: Pt plans to stay with Webb Silversmith at [facility], pt wanted to follow up on a referral request she sent earlier, please advise.

## 2021-03-12 DIAGNOSIS — G5601 Carpal tunnel syndrome, right upper limb: Secondary | ICD-10-CM | POA: Diagnosis not present

## 2021-03-12 DIAGNOSIS — M25831 Other specified joint disorders, right wrist: Secondary | ICD-10-CM | POA: Diagnosis not present

## 2021-04-04 DIAGNOSIS — R202 Paresthesia of skin: Secondary | ICD-10-CM | POA: Diagnosis not present

## 2021-05-03 DIAGNOSIS — M25831 Other specified joint disorders, right wrist: Secondary | ICD-10-CM | POA: Diagnosis not present

## 2021-06-18 DIAGNOSIS — R2231 Localized swelling, mass and lump, right upper limb: Secondary | ICD-10-CM | POA: Diagnosis not present

## 2021-06-18 DIAGNOSIS — M67431 Ganglion, right wrist: Secondary | ICD-10-CM | POA: Diagnosis not present

## 2021-08-08 HISTORY — PX: GANGLION CYST EXCISION: SHX1691

## 2021-08-09 DIAGNOSIS — M25531 Pain in right wrist: Secondary | ICD-10-CM | POA: Diagnosis not present

## 2021-08-09 DIAGNOSIS — G8918 Other acute postprocedural pain: Secondary | ICD-10-CM | POA: Diagnosis not present

## 2021-08-09 DIAGNOSIS — M67431 Ganglion, right wrist: Secondary | ICD-10-CM | POA: Diagnosis not present

## 2021-08-09 DIAGNOSIS — M6588 Other synovitis and tenosynovitis, other site: Secondary | ICD-10-CM | POA: Diagnosis not present

## 2021-08-09 DIAGNOSIS — M65831 Other synovitis and tenosynovitis, right forearm: Secondary | ICD-10-CM | POA: Diagnosis not present

## 2021-08-09 DIAGNOSIS — D2111 Benign neoplasm of connective and other soft tissue of right upper limb, including shoulder: Secondary | ICD-10-CM | POA: Diagnosis not present

## 2021-08-21 DIAGNOSIS — M67431 Ganglion, right wrist: Secondary | ICD-10-CM | POA: Diagnosis not present

## 2021-08-21 DIAGNOSIS — Z09 Encounter for follow-up examination after completed treatment for conditions other than malignant neoplasm: Secondary | ICD-10-CM | POA: Diagnosis not present

## 2021-09-26 DIAGNOSIS — M67431 Ganglion, right wrist: Secondary | ICD-10-CM | POA: Diagnosis not present

## 2021-09-26 DIAGNOSIS — R2231 Localized swelling, mass and lump, right upper limb: Secondary | ICD-10-CM | POA: Diagnosis not present

## 2021-10-16 DIAGNOSIS — R2231 Localized swelling, mass and lump, right upper limb: Secondary | ICD-10-CM | POA: Diagnosis not present

## 2021-10-16 DIAGNOSIS — M67431 Ganglion, right wrist: Secondary | ICD-10-CM | POA: Diagnosis not present

## 2021-10-30 DIAGNOSIS — R2231 Localized swelling, mass and lump, right upper limb: Secondary | ICD-10-CM | POA: Diagnosis not present

## 2021-10-30 DIAGNOSIS — M67431 Ganglion, right wrist: Secondary | ICD-10-CM | POA: Diagnosis not present

## 2022-02-10 ENCOUNTER — Ambulatory Visit (INDEPENDENT_AMBULATORY_CARE_PROVIDER_SITE_OTHER): Payer: BC Managed Care – PPO | Admitting: Internal Medicine

## 2022-02-10 ENCOUNTER — Encounter: Payer: Self-pay | Admitting: Internal Medicine

## 2022-02-10 VITALS — BP 134/82 | HR 68 | Temp 96.9°F | Ht 61.0 in | Wt 252.0 lb

## 2022-02-10 DIAGNOSIS — E538 Deficiency of other specified B group vitamins: Secondary | ICD-10-CM

## 2022-02-10 DIAGNOSIS — E782 Mixed hyperlipidemia: Secondary | ICD-10-CM | POA: Insufficient documentation

## 2022-02-10 DIAGNOSIS — Z0001 Encounter for general adult medical examination with abnormal findings: Secondary | ICD-10-CM | POA: Diagnosis not present

## 2022-02-10 DIAGNOSIS — Z1159 Encounter for screening for other viral diseases: Secondary | ICD-10-CM | POA: Diagnosis not present

## 2022-02-10 DIAGNOSIS — D72829 Elevated white blood cell count, unspecified: Secondary | ICD-10-CM | POA: Insufficient documentation

## 2022-02-10 DIAGNOSIS — E559 Vitamin D deficiency, unspecified: Secondary | ICD-10-CM | POA: Diagnosis not present

## 2022-02-10 DIAGNOSIS — Z23 Encounter for immunization: Secondary | ICD-10-CM | POA: Diagnosis not present

## 2022-02-10 NOTE — Patient Instructions (Signed)

## 2022-02-10 NOTE — Assessment & Plan Note (Signed)
Encourage diet and exercise for weight loss 

## 2022-02-10 NOTE — Progress Notes (Signed)
Subjective:    [name], female    DOB: Mar 25, 1981, 41 y.o.   MRN: [id number]  HPI  Patient presents to clinic today for annual exam.  Flu: 06/2020 Tetanus: 09/2007 COVID: Rande Brunt x 1 Pap smear: 07/2018 Dentist: biannually  Diet: She does eat meat. She consumes fruits and veggies. She does eat some fried foods. She drinks 1 soda, 1 cup of water. Exercise: Walking  Review of Systems     Past Medical History:  Diagnosis Date   Allergy    Asthma    Complication of anesthesia    hard to wake up post-op   Cyst of finger 07/2013   right long annular ligament cyst   Depression    Difficulty swallowing pills    Migraine    Multiple body piercings    nose and both ears - unsure if she can remove for surgery    Current Outpatient Medications  Medication Sig Dispense Refill   albuterol (VENTOLIN HFA) 108 (90 Base) MCG/ACT inhaler Inhale 2 puffs into the lungs every 6 (six) hours as needed for wheezing or shortness of breath. 6.7 g 0   ibuprofen (ADVIL) 200 MG tablet Take 200 mg by mouth every 6 (six) hours as needed for fever, headache or moderate pain.     loratadine (CLARITIN) 10 MG tablet Take 10 mg by mouth daily.     ondansetron (ZOFRAN ODT) 4 MG disintegrating tablet Take 1 tablet (4 mg total) by mouth every 8 (eight) hours as needed. 15 tablet 0   S[place] 28 0.25-35 MG-MCG tablet Take 1 tablet by mouth daily.     No current facility-administered medications for this visit.    Allergies  Allergen Reactions   Imitrex [Sumatriptan Succinate] Rash and Other (See Comments)    FEELS LIKE SKIN IS BURNING   Bupropion Other (See Comments)    CAUSED A PANIC ATTACK    Family History  Problem Relation Age of Onset   Breast cancer Maternal Aunt    Breast cancer Maternal Grandmother    Heart disease Maternal Grandmother    Hypertension Maternal Grandmother    Cancer Paternal Grandmother        ovarian/uterine? unsure    Social History    Socioeconomic History   Marital status: Divorced    Spouse name: Not on file   Number of children: Not on file   Years of education: Not on file   Highest education level: Not on file  Occupational History   Not on file  Tobacco Use   Smoking status: Never   Smokeless tobacco: Never  Substance and Sexual Activity   Alcohol use: Yes    Comment: occasionally   Drug use: No   Sexual activity: Yes    Birth control/protection: Surgical  Other Topics Concern   Not on file  Social History Narrative   Not on file   Social Determinants of Health   Financial Resource Strain: Not on file  Food Insecurity: Not on file  Transportation Needs: Not on file  Physical Activity: Not on file  Stress: Not on file  Social Connections: Not on file  Intimate Partner Violence: Not on file     Constitutional: Patient reports intermittent headaches.  Denies fever, malaise, fatigue, or abrupt weight changes.  HEENT: Denies eye pain, eye redness, ear pain, ringing in the ears, wax buildup, runny nose, nasal congestion, bloody nose, or sore throat. Respiratory: Denies difficulty breathing, shortness of breath, cough or sputum production.  Cardiovascular: Denies chest pain, chest tightness, palpitations or swelling in the hands or feet.  Gastrointestinal: Denies abdominal pain, bloating, constipation, diarrhea or blood in the stool.  GU: Denies urgency, frequency, pain with urination, burning sensation, blood in urine, odor or discharge. Musculoskeletal: Patient reports intermittent foot pain.  Denies decrease in range of motion, difficulty with gait, muscle pain or joint swelling.  Skin: Denies redness, rashes, lesions or ulcercations.  Neurological: Patient reports paresthesia of hands.  Denies dizziness, difficulty with memory, difficulty with speech or problems with balance and coordination.  Psych: Denies anxiety, depression, SI/HI.  No other specific complaints in a complete review of  systems (except as listed in HPI above).  Objective:   Physical Exam  BP 134/82 (BP Location: Left Arm, Patient Position: Sitting, Cuff Size: Large)   Pulse 68   Temp (!) 96.9 F (36.1 C) (Temporal)   Ht _0  (1.549 m)   Wt 252 lb (114.3 kg)   SpO2 97%   BMI 47.61 kg/m   Wt Readings from Last 3 Encounters:  10/04/20 252 lb (114.3 kg)  06/19/20 233 lb (105.7 kg)  08/16/19 249 lb (112.9 kg)    General: Appears her stated age, obese, in NAD. Skin: Warm, dry and intact. HEENT: Head: normal shape and size; Eyes: sclera white,  PERRLA and EOMs intact;  Neck:  Neck supple, trachea midline. No masses, lumps or thyromegaly present.  Cardiovascular: Normal rate and rhythm. S1,S2 noted.  No murmur, rubs or gallops noted. No JVD or BLE edema.  Pulmonary/Chest: Normal effort and positive vesicular breath sounds. No respiratory distress. No wheezes, rales or ronchi noted.  Abdomen: Soft and nontender. Normal bowel sounds.  Musculoskeletal: Strength 5/5 BUE/BLE.  No difficulty with gait.  Neurological: Alert and oriented. Cranial nerves II-XII grossly intact. Coordination normal.  Psychiatric: Mood and affect normal. Behavior is normal. Judgment and thought content normal.     BMET    Component Value Date/Time   NA 137 10/04/2020 1432   NA 142 06/27/2016 1412   NA 139 10/22/2011 1515   K 5.1 10/04/2020 1432   K 4.0 10/22/2011 1515   K 4.0 10/22/2011 1515   CL 103 10/04/2020 1432   CL 107 10/22/2011 1515   CL 107 10/22/2011 1515   CO2 21 (L) 10/04/2020 1432   CO2 27 10/22/2011 1515   CO2 27 10/22/2011 1515   GLUCOSE 100 (H) 10/04/2020 1432   GLUCOSE 85 10/22/2011 1515   BUN 17 10/04/2020 1432   BUN 13 06/27/2016 1412   BUN 14 10/22/2011 1515   CREATININE 0.74 10/04/2020 1432   CREATININE 0.86 10/22/2011 1515   CREATININE 0.86 10/22/2011 1515   CALCIUM 8.9 10/04/2020 1432   CALCIUM 8.9 10/22/2011 1515   CALCIUM 8.9 10/22/2011 1515   GFRNONAA >60 10/04/2020 1432   GFRNONAA  >60 10/22/2011 1515   GFRAA >60 07/05/2016 0535   GFRAA >60 10/22/2011 1515    Lipid Panel     Component Value Date/Time   CHOL 217 (H) 06/27/2016 1412   TRIG 276 (H) 06/27/2016 1412   HDL 37 (L) 06/27/2016 1412   CHOLHDL 5.9 (H) 06/27/2016 1412   LDLCALC 125 (H) 06/27/2016 1412    CBC    Component Value Date/Time   WBC 12.3 (H) 10/04/2020 1432   RBC 4.85 10/04/2020 1432   HGB 13.4 10/04/2020 1432   HGB 13.6 06/27/2016 1412   HCT 41.1 10/04/2020 1432   HCT 41.5 06/27/2016 1412   HCT 39 10/22/2011 1515  PLT 349 10/04/2020 1432   PLT 358 06/27/2016 1412   MCV 84.7 10/04/2020 1432   MCV 85 06/27/2016 1412   MCV 85 10/22/2011 1515   MCH 27.6 10/04/2020 1432   MCHC 32.6 10/04/2020 1432   RDW 13.4 10/04/2020 1432   RDW 14.0 06/27/2016 1412   RDW 13.6 10/22/2011 1515   LYMPHSABS 2.8 06/19/2020 1144   MONOABS 0.8 06/19/2020 1144   EOSABS 0.4 06/19/2020 1144   BASOSABS 0.1 06/19/2020 1144    Hgb A1C Lab Results  Component Value Date   HGBA1C 5.1 06/27/2016           Assessment & Plan:   Preventative Health Maintenance:  Encouraged her to get a flu shot in the fall Tetanus today Encouraged her to get her COVID booster Pap smear due 07/2023 Encouraged her to consume a balanced diet and exercise regimen Advised her to see an eye doctor and dentist annually We will check CBC, c-Met, lipid, A1c and hep C today  Vitamin D and B12 deficiency:  Vitamin D and B12 levels today   RTC in 6 months, follow-up chronic conditions Webb Silversmith, NP

## 2022-02-11 ENCOUNTER — Encounter: Payer: Self-pay | Admitting: Internal Medicine

## 2022-02-11 ENCOUNTER — Other Ambulatory Visit: Payer: Self-pay

## 2022-02-11 DIAGNOSIS — E559 Vitamin D deficiency, unspecified: Secondary | ICD-10-CM

## 2022-02-11 DIAGNOSIS — E782 Mixed hyperlipidemia: Secondary | ICD-10-CM

## 2022-02-11 LAB — LIPID PANEL
Cholesterol: 210 mg/dL — ABNORMAL HIGH (ref <200)
HDL: 42 mg/dL — ABNORMAL LOW (ref >=50)
LDL Cholesterol (Calc): 130 mg/dL (calc) — ABNORMAL HIGH
Non-HDL Cholesterol (Calc): 168 mg/dL (calc) — ABNORMAL HIGH (ref <130)
Total CHOL/HDL Ratio: 5 (calc) — ABNORMAL HIGH (ref <5.0)
Triglycerides: 233 mg/dL — ABNORMAL HIGH (ref <150)

## 2022-02-11 LAB — COMPLETE METABOLIC PANEL WITH GFR
AG Ratio: 1.5 (calc) (ref 1.0–2.5)
ALT: 19 U/L (ref 6–29)
AST: 14 U/L (ref 10–30)
Albumin: 3.8 g/dL (ref 3.6–5.1)
Alkaline phosphatase (APISO): 59 U/L (ref 31–125)
BUN: 17 mg/dL (ref 7–25)
CO2: 22 mmol/L (ref 20–32)
Calcium: 8.7 mg/dL (ref 8.6–10.2)
Chloride: 107 mmol/L (ref 98–110)
Creat: 0.62 mg/dL (ref 0.50–0.97)
Globulin: 2.5 g/dL (calc) (ref 1.9–3.7)
Glucose, Bld: 88 mg/dL (ref 65–99)
Potassium: 4.4 mmol/L (ref 3.5–5.3)
Sodium: 138 mmol/L (ref 135–146)
Total Bilirubin: 0.3 mg/dL (ref 0.2–1.2)
Total Protein: 6.3 g/dL (ref 6.1–8.1)
eGFR: 116 mL/min/{1.73_m2} (ref >=60)

## 2022-02-11 LAB — HEMOGLOBIN A1C
Hgb A1c MFr Bld: 5.3 % of total Hgb (ref <5.7)
Mean Plasma Glucose: 105 mg/dL
eAG (mmol/L): 5.8 mmol/L

## 2022-02-11 LAB — CBC
HCT: 40.8 % (ref 35.0–45.0)
Hemoglobin: 13.1 g/dL (ref 11.7–15.5)
MCH: 27.8 pg (ref 27.0–33.0)
MCHC: 32.1 g/dL (ref 32.0–36.0)
MCV: 86.4 fL (ref 80.0–100.0)
MPV: 10.7 fL (ref 7.5–12.5)
Platelets: 280 10*3/uL (ref 140–400)
RBC: 4.72 10*6/uL (ref 3.80–5.10)
RDW: 14.2 % (ref 11.0–15.0)
WBC: 7.5 10*3/uL (ref 3.8–10.8)

## 2022-02-11 LAB — VITAMIN B12: Vitamin B-12: 241 pg/mL (ref 200–1100)

## 2022-02-11 LAB — VITAMIN D 25 HYDROXY (VIT D DEFICIENCY, FRACTURES): Vit D, 25-Hydroxy: 23 ng/mL — ABNORMAL LOW (ref 30–100)

## 2022-02-11 LAB — HEPATITIS C ANTIBODY
Hepatitis C Ab: NONREACTIVE
SIGNAL TO CUT-OFF: 0.16 (ref <1.00)

## 2022-02-11 MED ORDER — VITAMIN D (ERGOCALCIFEROL) 1.25 MG (50000 UNIT) PO CAPS: 50000.0000 [IU] | ORAL_CAPSULE | ORAL | 0 refills | Status: DC

## 2022-02-11 NOTE — Telephone Encounter (Signed)
Pt advised.  She agreed to started a cholesterol medicine.  Please send to [facility] in [place].  Also schedule a 3 month lab only visit to recheck cholesterol and vitamin D.    Thanks,   -Mickel Baas

## 2022-02-11 NOTE — Telephone Encounter (Signed)
-----   Message from Jearld Fenton, NP sent at 02/11/2022  6:55 AM EDT ----- Cholesterol is elevated.  I think she should start cholesterol-lowering medication at this time.  Please let me know if she is agreeable and I will send this in.  Vitamin D is low.  I will send in weekly vitamin D for her to take for the next 12 weeks.  Blood counts are normal.  Liver and kidney function is normal.  She does not have diabetes.  Her vitamin B12 levels are on the low end of normal.  I would recommend 1000 mcg of vitamin B12 sublingually daily.

## 2022-02-11 NOTE — Addendum Note (Signed)
Addended by: Jearld Fenton on: 02/11/2022 06:55 AM   Modules accepted: Orders

## 2022-02-12 MED ORDER — ATORVASTATIN CALCIUM 20 MG PO TABS: 20.0000 mg | ORAL_TABLET | Freq: Every day | ORAL | 0 refills | Status: DC

## 2022-04-01 ENCOUNTER — Encounter: Payer: Self-pay | Admitting: Internal Medicine

## 2022-04-01 ENCOUNTER — Ambulatory Visit (INDEPENDENT_AMBULATORY_CARE_PROVIDER_SITE_OTHER): Payer: BC Managed Care – PPO | Admitting: Internal Medicine

## 2022-04-01 VITALS — BP 126/82 | HR 87 | Temp 96.9°F | Wt 252.0 lb

## 2022-04-01 DIAGNOSIS — D224 Melanocytic nevi of scalp and neck: Secondary | ICD-10-CM

## 2022-04-01 NOTE — Progress Notes (Signed)
Subjective:    [name], female    DOB: Nov 20, 1980, 41 y.o.   MRN: [id number]  HPI  Patient presents to the clinic today with complaint of a skin lesion of her neck.  She reports this has been there as long as she remembers. She did get new uniforms at work 2 weeks ago and the shirt seems to be rubbing it. She reported is swollen and painful.  Review of Systems     Past Medical History:  Diagnosis Date   Allergy    Asthma    Complication of anesthesia    hard to wake up post-op   Cyst of finger 07/2013   right long annular ligament cyst   Depression    Difficulty swallowing pills    Migraine    Multiple body piercings    nose and both ears - unsure if she can remove for surgery    Current Outpatient Medications  Medication Sig Dispense Refill   albuterol (VENTOLIN HFA) 108 (90 Base) MCG/ACT inhaler Inhale 2 puffs into the lungs every 6 (six) hours as needed for wheezing or shortness of breath. 6.7 g 0   atorvastatin (LIPITOR) 20 MG tablet Take 1 tablet (20 mg total) by mouth daily. 90 tablet 0   ibuprofen (ADVIL) 200 MG tablet Take 200 mg by mouth every 6 (six) hours as needed for fever, headache or moderate pain.     loratadine (CLARITIN) 10 MG tablet Take 10 mg by mouth daily.     Vitamin D, Ergocalciferol, (DRISDOL) 1.25 MG (50000 UNIT) CAPS capsule Take 1 capsule (50,000 Units total) by mouth once a week. For 12 weeks. Then start OTC Vitamin D3 2,000 unit daily. 12 capsule 0   No current facility-administered medications for this visit.    Allergies  Allergen Reactions   Imitrex [Sumatriptan Succinate] Rash and Other (See Comments)    FEELS LIKE SKIN IS BURNING   Bupropion Other (See Comments)    CAUSED A PANIC ATTACK    Family History  Problem Relation Age of Onset   Healthy Mother    Breast cancer Maternal Grandmother    Heart disease Maternal Grandmother    Hypertension Maternal Grandmother    Cancer Paternal Grandmother         ovarian/uterine? unsure   Breast cancer Maternal Aunt    Healthy Half-Sister     Social History   Socioeconomic History   Marital status: Divorced    Spouse name: Not on file   Number of children: Not on file   Years of education: Not on file   Highest education level: Not on file  Occupational History   Not on file  Tobacco Use   Smoking status: Never   Smokeless tobacco: Never  Vaping Use   Vaping Use: Never used  Substance and Sexual Activity   Alcohol use: Yes    Comment: occasionally   Drug use: No   Sexual activity: Yes    Birth control/protection: Surgical  Other Topics Concern   Not on file  Social History Narrative   Not on file   Social Determinants of Health   Financial Resource Strain: Not on file  Food Insecurity: Not on file  Transportation Needs: Not on file  Physical Activity: Not on file  Stress: Not on file  Social Connections: Not on file  Intimate Partner Violence: Not on file     Constitutional: Denies fever, malaise, fatigue, headache or abrupt weight changes.  Respiratory: Denies difficulty  breathing, shortness of breath, cough or sputum production.   Cardiovascular: Denies chest pain, chest tightness, palpitations or swelling in the hands or feet.  Skin: Patient reports skin lesion of neck.  Denies rashes or ulcercations.   No other specific complaints in a complete review of systems (except as listed in HPI above).  Objective:   Physical Exam BP 126/82 (BP Location: Left Arm, Patient Position: Sitting, Cuff Size: Large)   Pulse 87   Temp (!) 96.9 F (36.1 C) (Temporal)   Wt 252 lb (114.3 kg)   SpO2 97%   BMI 47.61 kg/m   Wt Readings from Last 3 Encounters:  02/10/22 252 lb (114.3 kg)  10/04/20 252 lb (114.3 kg)  06/19/20 233 lb (105.7 kg)    General: Appears her stated age, obese in NAD. Skin: Pedunculated mole noted of left side of neck, not irritated or infected. Cardiovascular: Normal rate. Pulmonary/Chest: Normal  effort and positive vesicular breath sounds.  Neurological: Alert and oriented.    BMET    Component Value Date/Time   NA 138 02/10/2022 0832   NA 142 06/27/2016 1412   NA 139 10/22/2011 1515   K 4.4 02/10/2022 0832   K 4.0 10/22/2011 1515   K 4.0 10/22/2011 1515   CL 107 02/10/2022 0832   CL 107 10/22/2011 1515   CL 107 10/22/2011 1515   CO2 22 02/10/2022 0832   CO2 27 10/22/2011 1515   CO2 27 10/22/2011 1515   GLUCOSE 88 02/10/2022 0832   GLUCOSE 85 10/22/2011 1515   BUN 17 02/10/2022 0832   BUN 13 06/27/2016 1412   BUN 14 10/22/2011 1515   CREATININE 0.62 02/10/2022 0832   CALCIUM 8.7 02/10/2022 0832   CALCIUM 8.9 10/22/2011 1515   CALCIUM 8.9 10/22/2011 1515   GFRNONAA >60 10/04/2020 1432   GFRNONAA >60 10/22/2011 1515   GFRAA >60 07/05/2016 0535   GFRAA >60 10/22/2011 1515    Lipid Panel     Component Value Date/Time   CHOL 210 (H) 02/10/2022 0832   CHOL 217 (H) 06/27/2016 1412   TRIG 233 (H) 02/10/2022 0832   HDL 42 (L) 02/10/2022 0832   HDL 37 (L) 06/27/2016 1412   CHOLHDL 5.0 (H) 02/10/2022 0832   LDLCALC 130 (H) 02/10/2022 0832    CBC    Component Value Date/Time   WBC 7.5 02/10/2022 0832   RBC 4.72 02/10/2022 0832   HGB 13.1 02/10/2022 0832   HGB 13.6 06/27/2016 1412   HCT 40.8 02/10/2022 0832   HCT 41.5 06/27/2016 1412   HCT 39 10/22/2011 1515   PLT 280 02/10/2022 0832   PLT 358 06/27/2016 1412   MCV 86.4 02/10/2022 0832   MCV 85 06/27/2016 1412   MCV 85 10/22/2011 1515   MCH 27.8 02/10/2022 0832   MCHC 32.1 02/10/2022 0832   RDW 14.2 02/10/2022 0832   RDW 14.0 06/27/2016 1412   RDW 13.6 10/22/2011 1515   LYMPHSABS 2.8 06/19/2020 1144   MONOABS 0.8 06/19/2020 1144   EOSABS 0.4 06/19/2020 1144   BASOSABS 0.1 06/19/2020 1144    Hgb A1C Lab Results  Component Value Date   HGBA1C 5.3 02/10/2022           Assessment & Plan:   Mole of Neck:  Mole removed by this provider Procedure Note:  Discussed risk of procedures  including bleeding, pain, infection and scarring Informed consent obtained verbally Area cleansed with Betadine x3 Area numbed with lidocaine with epi 1% Lesion excised with #11  blade Bleeding controlled with pressure Area cleaned with gauze and saline Band-Aid applied Patient tolerated well, no complications  RTC in 4 months for follow-up of chronic conditions Webb Silversmith, NP

## 2022-04-01 NOTE — Patient Instructions (Signed)
Mole Excision Mole excision is a procedure to remove (excise) a mole from the skin. Most moles are noncancerous (are benign) and do not require treatment. Some moles are larger than usual or look like cancerous moles (atypical moles). You may have a mole excision if: You have an atypical mole that your health care provider thinks should be looked at under a microscope to see if it is cancerous (biopsy). You have a mole that is causing pain. You have a mole that you want removed because you do not like the way it looks. Tell a health care provider about: Any allergies you have. All medicines you are taking, including vitamins, herbs, eye drops, creams, and over-the-counter medicines. Any problems you or family members have had with anesthetic medicines. Any bleeding problems you have. Any surgeries you have had. Any medical conditions you have. Whether you are pregnant or may be pregnant. What are the risks? Generally, this is a safe procedure. However, problems may occur, including: Excessive bleeding. Infection. Scarring. Allergic reactions to medicines. Damage to the skin or other tissues around the mole. What happens before the procedure? Ask your health care provider what steps will be taken to help prevent infection. These may include: Removing hair around the mole. Washing skin with a germ-killing soap. What happens during the procedure?     You will be given a medicine to numb the area (local anesthetic). Your health care provider will outline the mole with ink and mark the center with a dot. This will serve as a guide during the procedure. Depending on the size of your mole, your health care provider will remove it using: A surgical blade. The mole will be cut out or shaved off (shave excision). A hollow tube with a sharp end (punch device). This may be used for larger moles. Your health care provider may use stitches (sutures) to close the wound in the skin where the mole  was removed (excision site). Skin glue or adhesive strips may also be used. A bandage (dressing) may be applied over the area. The procedure may vary among health care providers and hospitals. What can I expect after the procedure? You may return to your normal activities as told by your health care provider. It is up to you to get any test results. If a sample will be tested in a lab, ask your health care provider or the department that is doing the procedure when your results will be ready. Talk with your health care provider about what your results mean. After the procedure, it is common to have: Mild pain. Your pain may increase as the anesthetic medicine wears off. Mild redness and swelling. Follow these instructions at home: Incision care  Follow instructions from your health care provider about how to take care of your incision. Make sure you: Wash your hands with soap and water for at least 20 seconds before and after you change your bandage (dressing). If soap and water are not available, use hand sanitizer. Change your dressing as told by your health care provider. Leave stitches (sutures), skin glue, or adhesive strips in place. These skin closures may need to stay in place for 2 weeks or longer. If adhesive strip edges start to loosen and curl up, you may trim the loose edges. Do not remove adhesive strips completely unless your health care provider tells you to do that. Check your incision area every day for signs of infection. Check for: More redness, swelling, or pain. Fluid or blood. Warmth.  Pus or a bad smell. General instructions Take over-the-counter and prescription medicines only as told by your health care provider. Follow instructions from your health care provider about how to minimize scarring. Avoid sun exposure until the area has healed. Scarring should lessen over time. To help prevent scarring, make sure to cover the area with sunscreen of at least SPF 30 after  the wound has healed and all skin closures have been removed or have fallen off. Keep all follow-up visits. This is important. Contact a health care provider if: A mole grows back in the same place where a mole had been removed. You have a fever. You have more redness, swelling, or pain at the incision site. You have fluid or blood coming from your incision site. Your incision site feels warm to the touch. You have pus or a bad smell coming from your incision site. Your incision site feels numb for several days after the procedure. Summary Mole excision is a procedure to remove (excise) a mole from the skin. You will be given a medicine to numb the area (local anesthetic) during the procedure to remove the mole. After the procedure, it is common to have mild pain, redness, and swelling. Wash your hands with soap and water for at least 20 seconds before and after you change your bandage (dressing). If soap and water are not available, use hand sanitizer. Contact your health care provider if you have problems or questions. This information is not intended to replace advice given to you by your health care provider. Make sure you discuss any questions you have with your health care provider. Document Revised: 05/16/2021 Document Reviewed: 05/16/2021 Elsevier Patient Education  [place].

## 2022-04-29 ENCOUNTER — Other Ambulatory Visit: Payer: Self-pay | Admitting: Internal Medicine

## 2022-04-30 NOTE — Telephone Encounter (Signed)
Requested medication (s) are due for refill today: Yes  Requested medication (s) are on the active medication list: Yes  Last refill:  02/11/22  Future visit scheduled: Yes  Notes to clinic:  See request.    Requested Prescriptions  Pending Prescriptions Disp Refills   Vitamin D, Ergocalciferol, (DRISDOL) 1.25 MG (50000 UNIT) CAPS capsule [Pharmacy Med Name: VITAMIN D2 1.'25MG'$ (50,000 UNIT)] 12 capsule 0    Sig: TAKE 1 CAPSULE BY MOUTH ONCE A WEEK. FOR 12 WEEKS. THEN START OTC VITAMIN D3 2,000 UNIT DAILY.     Endocrinology:  Vitamins - Vitamin D Supplementation 2 Failed - 04/29/2022 12:34 PM      Failed - Manual Review: Route requests for 50,000 IU strength to the provider      Failed - Vitamin D in normal range and within 360 days    VITD  Date Value Ref Range Status  06/19/2020 23.51 (L) 30.00 - 100.00 ng/mL Final   Vit D, 25-Hydroxy  Date Value Ref Range Status  02/10/2022 23 (L) 30 - 100 ng/mL Final    Comment:    Vitamin D Status         25-OH Vitamin D: . Deficiency:                    <20 ng/mL Insufficiency:             20 - 29 ng/mL Optimal:                 > or = 30 ng/mL . For 25-OH Vitamin D testing on patients on  D2-supplementation and patients for whom quantitation  of D2 and D3 fractions is required, the Quest[facility]ureD(TM) 25-OH VIT D, (D2,D3), LC/MS/MS is recommended: order  code [id number] (patients >67yr). See Note 1 . Note 1 . For additional information, please refer to  http://education.Quest[facility].com/faq/FAQ199  (This link is being provided for informational/ educational purposes only.)          Passed - Ca in normal range and within 360 days    Calcium  Date Value Ref Range Status  02/10/2022 8.7 8.6 - 10.2 mg/dL Final  10/22/2011 8.9 mg/dL Final   Calcium, Total  Date Value Ref Range Status  10/22/2011 8.9 8.5 - 10.1 mg/dL Final         Passed - Valid encounter within last 12 months    Recent Outpatient Visits           4 weeks ago  Irritated nevus of neck   [place] [facility]   2 months ago [facility]

## 2022-05-08 ENCOUNTER — Other Ambulatory Visit: Payer: Self-pay

## 2022-05-08 DIAGNOSIS — E782 Mixed hyperlipidemia: Secondary | ICD-10-CM

## 2022-05-08 DIAGNOSIS — E559 Vitamin D deficiency, unspecified: Secondary | ICD-10-CM

## 2022-05-09 ENCOUNTER — Other Ambulatory Visit: Payer: BC Managed Care – PPO

## 2022-05-09 DIAGNOSIS — E782 Mixed hyperlipidemia: Secondary | ICD-10-CM | POA: Diagnosis not present

## 2022-05-09 DIAGNOSIS — E559 Vitamin D deficiency, unspecified: Secondary | ICD-10-CM | POA: Diagnosis not present

## 2022-05-10 ENCOUNTER — Other Ambulatory Visit: Payer: Self-pay | Admitting: Internal Medicine

## 2022-05-10 LAB — LIPID PANEL
Cholesterol: 148 mg/dL (ref <200)
HDL: 42 mg/dL — ABNORMAL LOW (ref >=50)
LDL Cholesterol (Calc): 81 mg/dL (calc)
Non-HDL Cholesterol (Calc): 106 mg/dL (calc) (ref <130)
Total CHOL/HDL Ratio: 3.5 (calc) (ref <5.0)
Triglycerides: 153 mg/dL — ABNORMAL HIGH (ref <150)

## 2022-05-10 LAB — VITAMIN D 25 HYDROXY (VIT D DEFICIENCY, FRACTURES): Vit D, 25-Hydroxy: 51 ng/mL (ref 30–100)

## 2022-05-12 ENCOUNTER — Encounter: Payer: Self-pay | Admitting: Internal Medicine

## 2022-05-13 NOTE — Telephone Encounter (Signed)
Requested Prescriptions  Pending Prescriptions Disp Refills  . atorvastatin (LIPITOR) 20 MG tablet [Pharmacy Med Name: ATORVASTATIN 20 MG TABLET] 90 tablet 0    Sig: TAKE 1 TABLET BY MOUTH EVERY DAY     Cardiovascular:  Antilipid - Statins Failed - 05/10/2022  8:51 AM      Failed - Lipid Panel in normal range within the last 12 months    Cholesterol, Total  Date Value Ref Range Status  06/27/2016 217 (H) 100 - 199 mg/dL Final   Cholesterol  Date Value Ref Range Status  05/09/2022 148 <200 mg/dL Final   LDL Cholesterol (Calc)  Date Value Ref Range Status  05/09/2022 81 mg/dL (calc) Final    Comment:    Reference range: <100 . Desirable range <100 mg/dL for primary prevention;   <70 mg/dL for patients with CHD or diabetic patients  with > or = 2 CHD risk factors. Marland Kitchen LDL-C is now calculated using the Martin-Hopkins  calculation, which is a validated novel method providing  better accuracy than the Friedewald equation in the  estimation of LDL-C.  Cresenciano Genre et al. Annamaria Helling. 4128;786(76): 2061-2068  (http://education.[facility].com/faq/FAQ164)    HDL  Date Value Ref Range Status  05/09/2022 42 (L) > OR = 50 mg/dL Final  06/27/2016 37 (L) >39 mg/dL Final   Triglycerides  Date Value Ref Range Status  05/09/2022 153 (H) <150 mg/dL Final         Passed - Patient is not pregnant      Passed - Valid encounter within last 12 months    Recent Outpatient Visits          1 month ago Irritated nevus of neck   [facility] [place], Coralie Keens, NP   3 months ago Encounter for general adult medical examination with abnormal findings   [facility] [place], Coralie Keens, NP

## 2022-08-04 ENCOUNTER — Ambulatory Visit (INDEPENDENT_AMBULATORY_CARE_PROVIDER_SITE_OTHER): Payer: BC Managed Care – PPO | Admitting: Family Medicine

## 2022-08-04 ENCOUNTER — Encounter: Payer: Self-pay | Admitting: Family Medicine

## 2022-08-04 VITALS — BP 122/79 | HR 70 | Ht 61.0 in | Wt 251.6 lb

## 2022-08-04 DIAGNOSIS — H6993 Unspecified Eustachian tube disorder, bilateral: Secondary | ICD-10-CM | POA: Diagnosis not present

## 2022-08-04 DIAGNOSIS — H65192 Other acute nonsuppurative otitis media, left ear: Secondary | ICD-10-CM

## 2022-08-04 MED ORDER — PREDNISONE 10 MG PO TABS: ORAL_TABLET | ORAL | 0 refills | Status: DC

## 2022-08-04 NOTE — Patient Instructions (Addendum)
Thank you for coming to the office today.  You have some Eustachian Tube Dysfunction, this problem is usually caused by some deeper sinus swelling and pressure, causing difficulty of eustachian tubes to clear fluid from behind ear drum. You can have ear pain, pressure, fullness, loss of hearing. Often related to sinus symptoms and sometimes with sinusitis or infection or allergy symptoms.  Treatment: - Start Prednisone taper - hold ibuprofen / advil - Start OTC Sudafed (behind the counter) oral decongestant to reduce fluid pressure in ear - the new version that is not as good, "Phenylephrine". You need the "Pseudaephedrine"  - If this plan doesn't work then I recommend OTC - Start using nasal steroid spray, Flonase 2 sprays in each nostril every day for at least 4-6 weeks, and maybe longer - KEEP using OTC allergy medicine (Claritin, Zyrtec, or Allegra - or generics) once daily  - AVOID Afrin nasal spray, if you use this do not use more than 3 days or can make sinus swelling worse  If any significant worsening, loss of hearing, constant pain, fever/chills, or concern for infection - notify office and we can send in an antibiotic. Or if just persistent pressure that is not improving, you may contact me back within 1 week and we can consider a brief course of oral steroid prednisone for 3 days only to help reduce swelling, as discussed this is not ideal treatment and can cause side effects.   Please schedule a Follow-up Appointment to: Return if symptoms worsen or fail to improve.  If you have any other questions or concerns, please feel free to call the office or send a message through [place]. You may also schedule an earlier appointment if necessary.  Additionally, you may be receiving a survey about your experience at our office within a few days to 1 week by e-mail or mail. We value your feedback.  Nobie Putnam, DO [place]

## 2022-08-04 NOTE — Progress Notes (Signed)
Subjective:    [name], female    DOB: 02/01/1981, 41 y.o.   MRN: [id number]  Deanna Thomas is a 41 y.o. female presenting on 08/04/2022 for Ear Pain  Patient presents for a same day appointment.  PCP Webb Silversmith, FNP  HPI  Left Ear Eustachian Tube Dysfunction / Pressure Popping Reports symptoms feels a crackling sound and popping in both ears. She has some pressure and discomfort but not pain. She says both ears "pop a lot" usually this happens with sinus issues for her. She admits increased sinus congestion and drainage. Some worsening dizziness but not vertigo. - Currently taking Claritin daily for allergies Denies any fever chills cough       04/01/2022    1:16 PM 02/10/2022    8:44 AM 12/01/2017    8:26 AM  Depression screen PHQ 2/9  Decreased Interest 1 1 0  Down, Depressed, Hopeless 1 0   PHQ - 2 Score 2 1 0  Altered sleeping 1 2   Tired, decreased energy 1 2   Change in appetite 1 2   Feeling bad or failure about yourself  0 1   Trouble concentrating 1 1   Moving slowly or fidgety/restless 0 0   Suicidal thoughts 0 0   PHQ-9 Score 6 9   Difficult doing work/chores Not difficult at all Not difficult at all     Social History   Tobacco Use   Smoking status: Never   Smokeless tobacco: Never  Vaping Use   Vaping Use: Never used  Substance Use Topics   Alcohol use: Yes    Comment: occasionally   Drug use: No    Review of Systems Per HPI unless specifically indicated above     Objective:    BP 122/79   Pulse 70   Ht '5\' 1"'$  (1.549 m)   Wt 251 lb 9.6 oz (114.1 kg)   SpO2 99%   BMI 47.54 kg/m   Wt Readings from Last 3 Encounters:  08/04/22 251 lb 9.6 oz (114.1 kg)  04/01/22 252 lb (114.3 kg)  02/10/22 252 lb (114.3 kg)    Physical Exam Vitals and nursing note reviewed.  Constitutional:      General: She is not in acute distress.    Appearance: Normal appearance. She is well-developed. She is not diaphoretic.      Comments: Well-appearing, comfortable, cooperative  HENT:     Head: Normocephalic and atraumatic.     Right Ear: There is no impacted cerumen.     Left Ear: There is no impacted cerumen.     Ears:     Comments: Left ear with large effusion bulging. No erythema. R side slight effusion without erythema. Eyes:     General:        Right eye: No discharge.        Left eye: No discharge.     Conjunctiva/sclera: Conjunctivae normal.  Cardiovascular:     Rate and Rhythm: Normal rate.  Pulmonary:     Effort: Pulmonary effort is normal.  Skin:    General: Skin is warm and dry.     Findings: No erythema or rash.  Neurological:     Mental Status: She is alert and oriented to person, place, and time.  Psychiatric:        Mood and Affect: Mood normal.        Behavior: Behavior normal.        Thought Content: Thought content normal.  Comments: Well groomed, good eye contact, normal speech and thoughts       Results for orders placed or performed in visit on 05/08/22  VITAMIN D 25 Hydroxy (Vit-D Deficiency, Fractures)  Result Value Ref Range   Vit D, 25-Hydroxy 51 30 - 100 ng/mL  Lipid panel  Result Value Ref Range   Cholesterol 148 <200 mg/dL   HDL 42 (L) > OR = 50 mg/dL   Triglycerides 153 (H) <150 mg/dL   LDL Cholesterol (Calc) 81 mg/dL (calc)   Total CHOL/HDL Ratio 3.5 <5.0 (calc)   Non-HDL Cholesterol (Calc) 106 <130 mg/dL (calc)      Assessment & Plan:   Problem List Items Addressed This Visit   None Visit Diagnoses     Eustachian tube dysfunction, bilateral    -  Primary   Relevant Medications   predniSONE (DELTASONE) 10 MG tablet   Acute effusion of left ear       Relevant Medications   predniSONE (DELTASONE) 10 MG tablet       Acute L > Right bilateral eustachian tube dysfunction with secondary L ear effusion, without loss of hearing or evidence of AOM or sinusitis. 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 - Inadequate conservative  therapy cannot tolerate nasal steroid due to nosebleeds  Plan: 1. Defer nasal steroid and instead start oral prednisone tape 6 day given severity of effusion. No sign of infection at this time reassuring. 2. Continue OTC oral anti-histamine daily 3. Recommend may consider trial OTC oral decongestant for up to 1 week or less 4. AVOID Afrin nasal decongestant, counseled if uses, max dose up to 3 days or less, avoid rebound rhinitis Follow-up if not improved or worsening, return criteria, future consider ENT consult    Meds ordered this encounter  Medications   predniSONE (DELTASONE) 10 MG tablet    Sig: Take 6 tabs with breakfast Day 1, 5 tabs Day 2, 4 tabs Day 3, 3 tabs Day 4, 2 tabs Day 5, 1 tab Day 6.    Dispense:  21 tablet    Refill:  0     Follow up plan: Return if symptoms worsen or fail to improve.   Nobie Putnam, [facility] 08/04/2022, 9:16 AM

## 2022-08-10 ENCOUNTER — Other Ambulatory Visit: Payer: Self-pay | Admitting: Internal Medicine

## 2022-08-11 NOTE — Telephone Encounter (Signed)
Requested Prescriptions  Pending Prescriptions Disp Refills   atorvastatin (LIPITOR) 20 MG tablet [Pharmacy Med Name: ATORVASTATIN 20 MG TABLET] 90 tablet 2    Sig: TAKE 1 TABLET BY MOUTH EVERY DAY     Cardiovascular:  Antilipid - Statins Failed - 08/10/2022  9:03 AM      Failed - Lipid Panel in normal range within the last 12 months    Cholesterol, Total  Date Value Ref Range Status  06/27/2016 217 (H) 100 - 199 mg/dL Final   Cholesterol  Date Value Ref Range Status  05/09/2022 148 <200 mg/dL Final   LDL Cholesterol (Calc)  Date Value Ref Range Status  05/09/2022 81 mg/dL (calc) Final    Comment:    Reference range: <100 . Desirable range <100 mg/dL for primary prevention;   <70 mg/dL for patients with CHD or diabetic patients  with > or = 2 CHD risk factors. Marland Kitchen LDL-C is now calculated using the Martin-Hopkins  calculation, which is a validated novel method providing  better accuracy than the Friedewald equation in the  estimation of LDL-C.  Cresenciano Genre et al. Annamaria Helling. 2409;735(32): 2061-2068  (http://education.[facility].com/faq/FAQ164)    HDL  Date Value Ref Range Status  05/09/2022 42 (L) > OR = 50 mg/dL Final  06/27/2016 37 (L) >39 mg/dL Final   Triglycerides  Date Value Ref Range Status  05/09/2022 153 (H) <150 mg/dL Final         Passed - Patient is not pregnant      Passed - Valid encounter within last 12 months    Recent Outpatient Visits           1 week ago Eustachian tube dysfunction, bilateral   [place], DO   4 months ago Irritated nevus of neck   [facility], Coralie Keens, NP   6 months ago Encounter for general adult medical examination with abnormal findings   [facility] [place], Coralie Keens, NP

## 2022-08-21 IMAGING — MG DIGITAL SCREENING BILAT W/ TOMO W/ CAD
8 series · 8 of 24 positions shown · non-contrast
Comparison: Previous exam(s).

CLINICAL DATA: Screening.

EXAM:
DIGITAL SCREENING BILATERAL MAMMOGRAM WITH TOMO AND CAD

[R CC synth-2D]
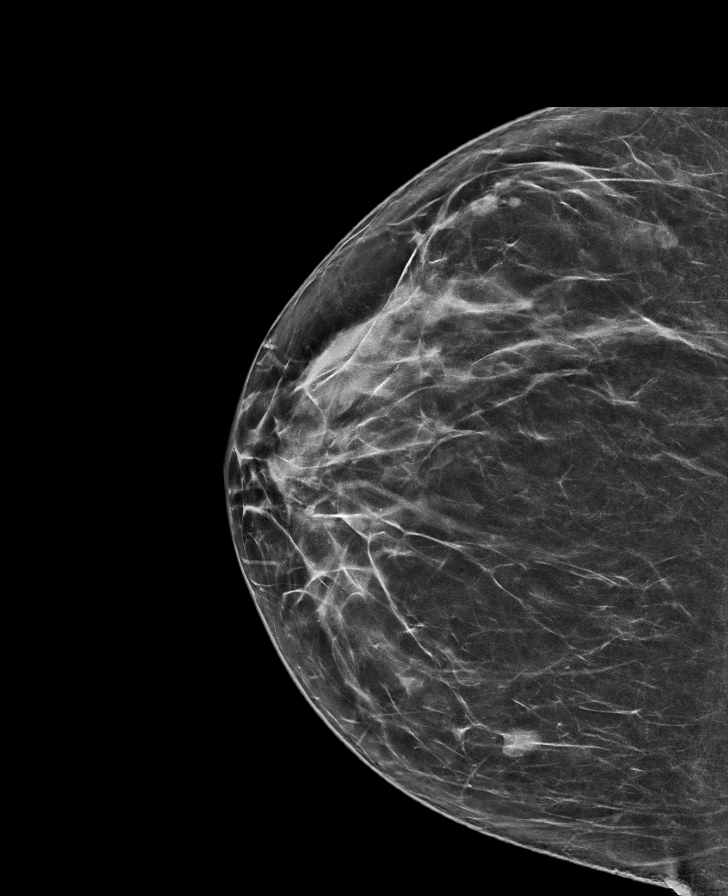

[L CC synth-2D]
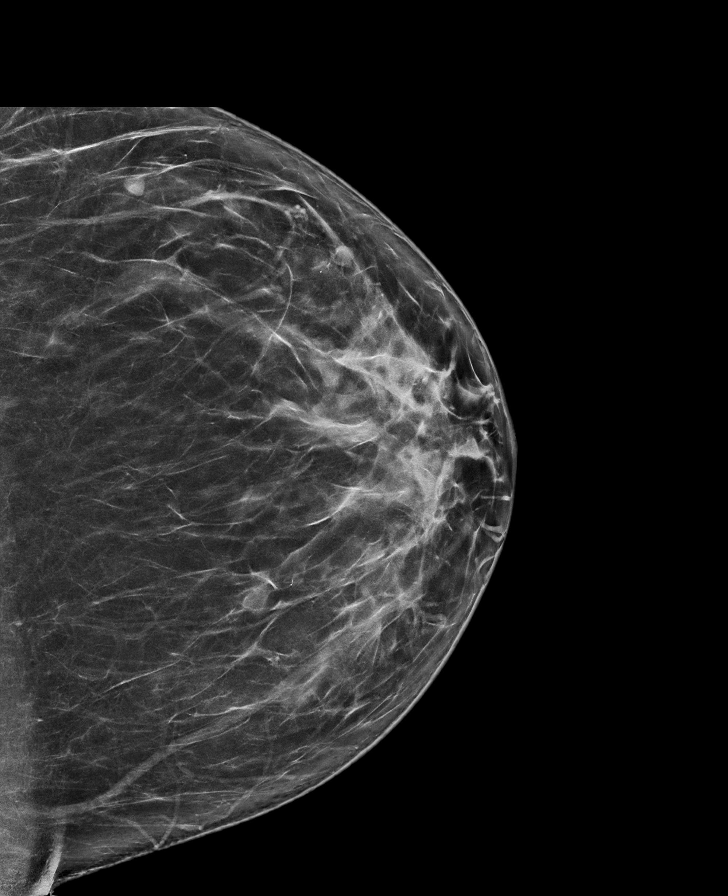

[L MLO synth-2D]
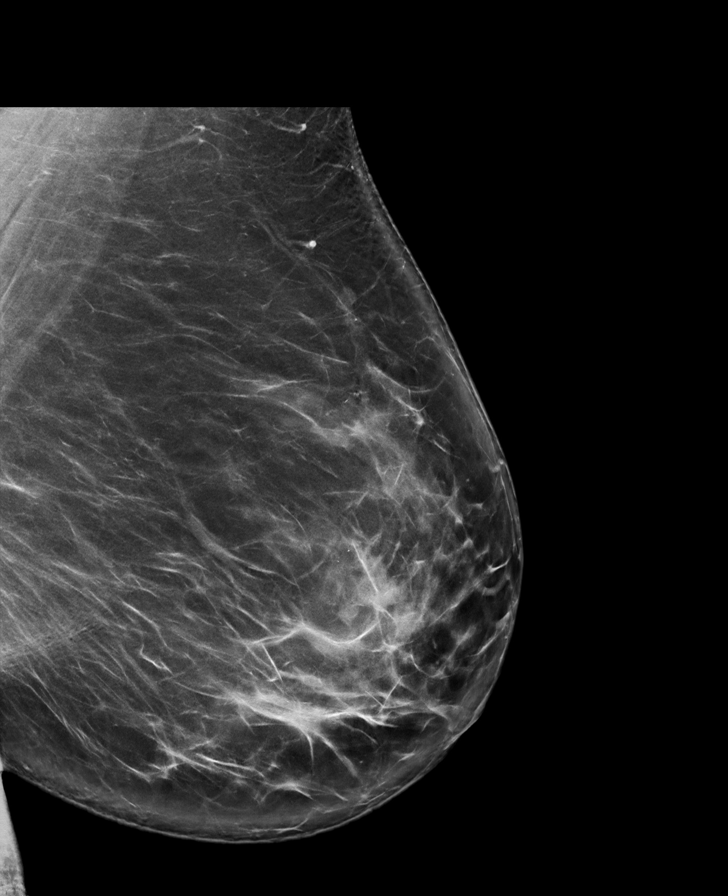

[R MLO synth-2D]
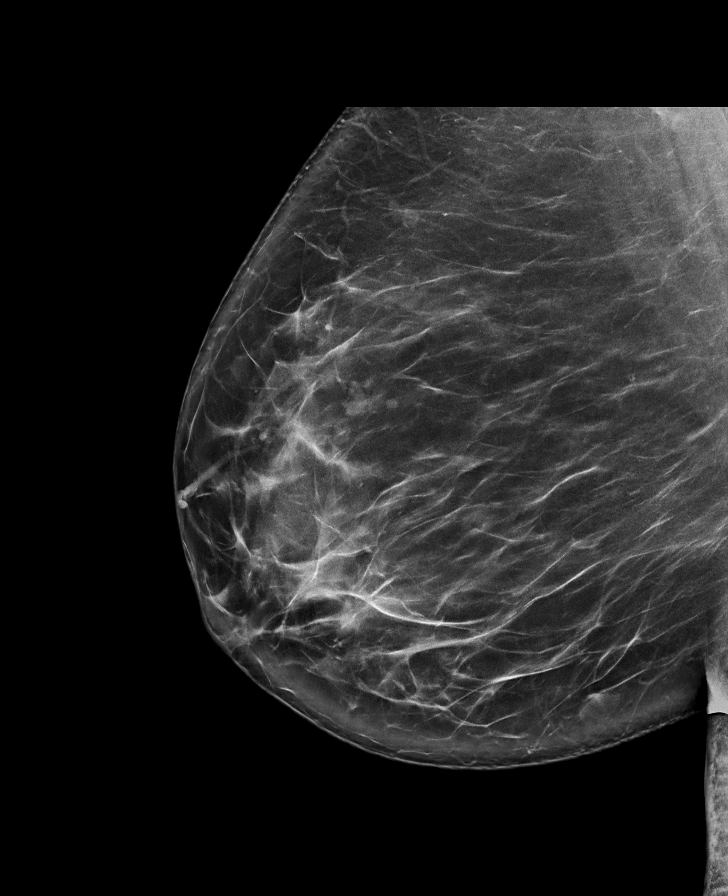

[L MLO tomo · tomo slice 53/104.0]
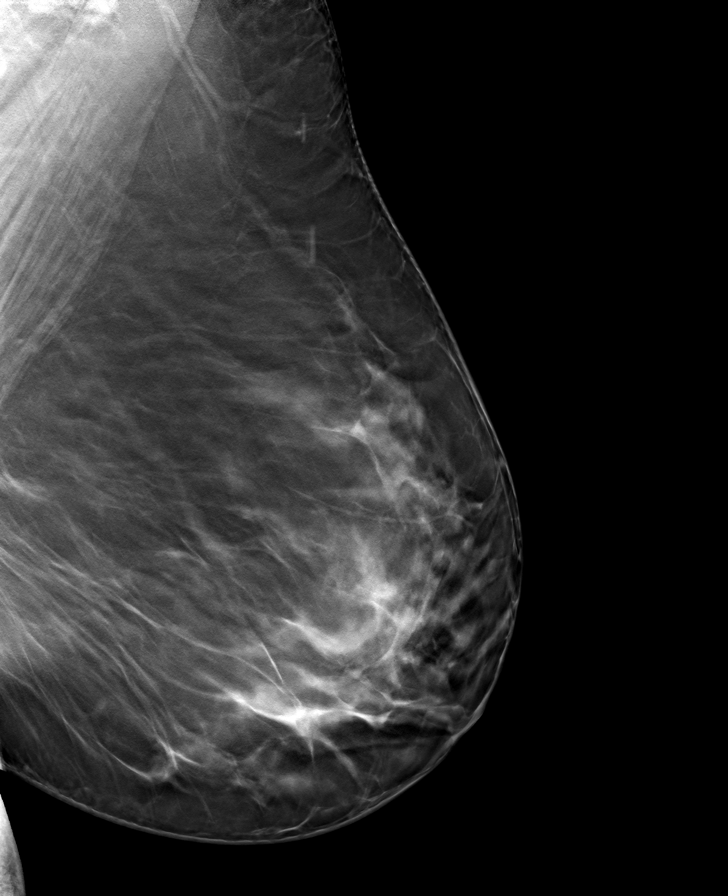

[R MLO tomo · tomo slice 51/101.0]
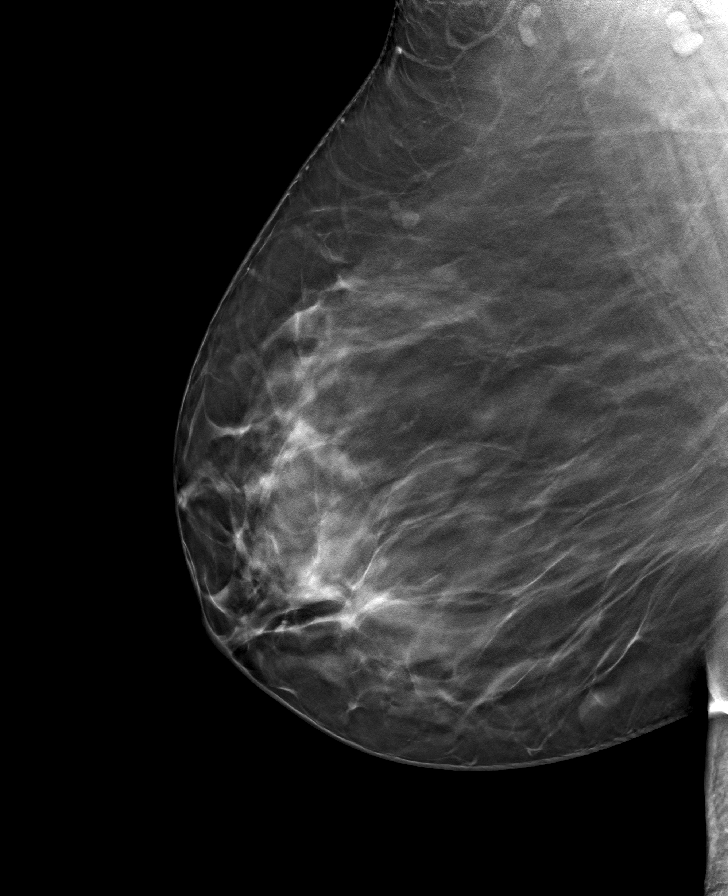

[R CC tomo · tomo slice 41/80.0]
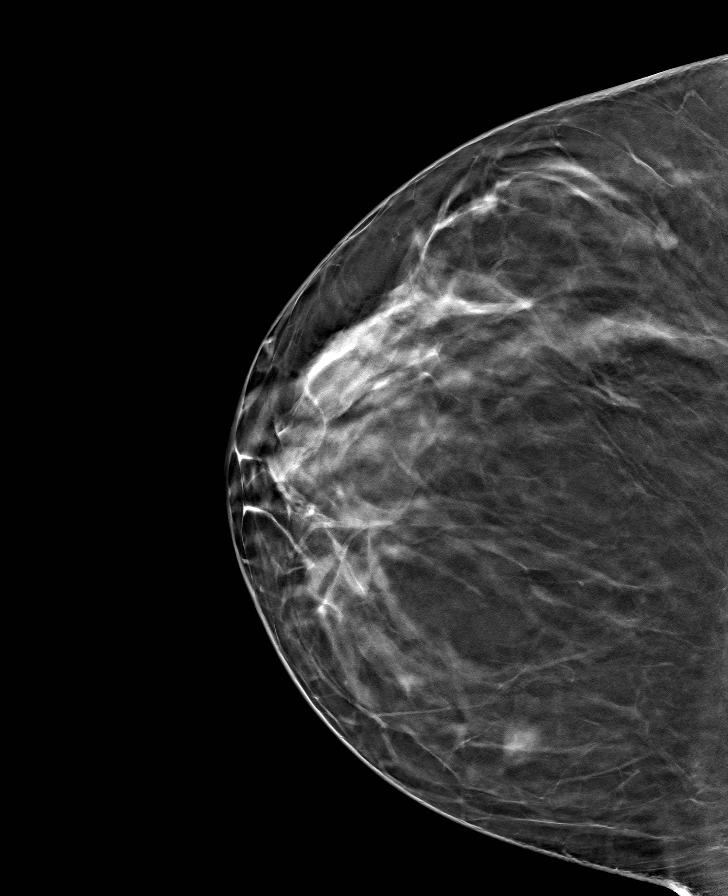

[L CC tomo · tomo slice 43/86.0]
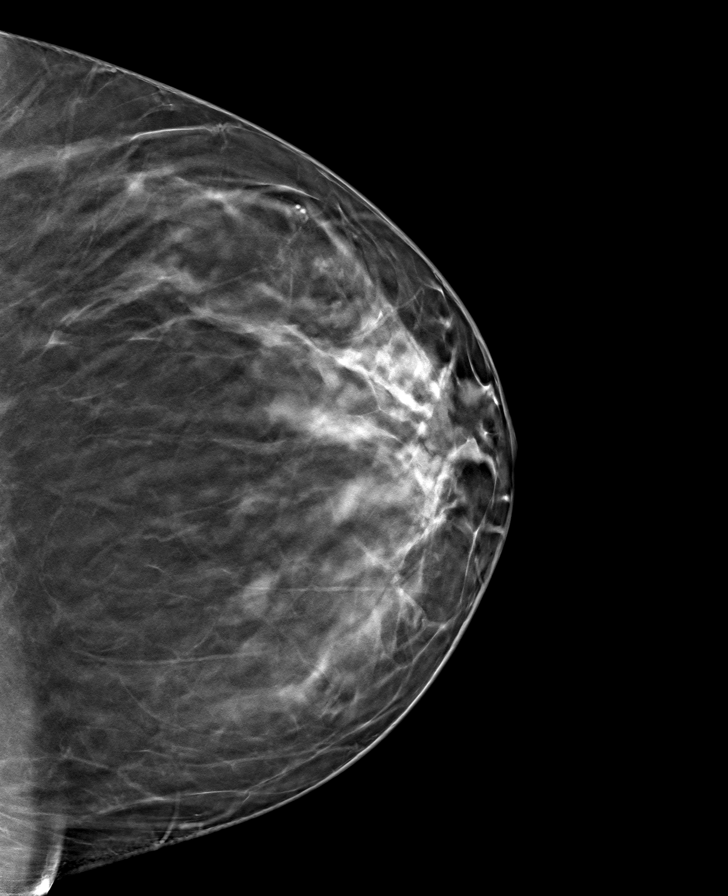

[8 of 24 positions shown; findings below may reference images not displayed]

ACR Breast Density Category b: There are scattered areas of
fibroglandular density.
FINDINGS: There are no findings suspicious for malignancy. Images were
processed with CAD.
IMPRESSION: No mammographic evidence of malignancy. A result letter of this
screening mammogram will be mailed directly to the patient.

RECOMMENDATION:
Screening mammogram at age 42. (Code:[id number]-[id number])

BI-RADS CATEGORY  1: Negative.

## 2022-09-17 ENCOUNTER — Ambulatory Visit (INDEPENDENT_AMBULATORY_CARE_PROVIDER_SITE_OTHER): Payer: Self-pay | Admitting: Internal Medicine

## 2022-09-17 ENCOUNTER — Encounter: Payer: Self-pay | Admitting: Internal Medicine

## 2022-09-17 VITALS — BP 116/70 | HR 79 | Temp 96.9°F | Wt 245.0 lb

## 2022-09-17 DIAGNOSIS — B9689 Other specified bacterial agents as the cause of diseases classified elsewhere: Secondary | ICD-10-CM

## 2022-09-17 DIAGNOSIS — J019 Acute sinusitis, unspecified: Secondary | ICD-10-CM

## 2022-09-17 MED ORDER — AMOXICILLIN-POT CLAVULANATE 400-57 MG/5ML PO SUSR
875.0000 mg | Freq: Two times a day (BID) | ORAL | 0 refills | Status: AC
Start: 2022-09-17 — End: 2022-09-27

## 2022-09-17 MED ORDER — ALBUTEROL SULFATE HFA 108 (90 BASE) MCG/ACT IN AERS: 2.0000 | INHALATION_SPRAY | Freq: Four times a day (QID) | RESPIRATORY_TRACT | 0 refills | Status: DC | PRN

## 2022-09-17 MED ORDER — AMOXICILLIN-POT CLAVULANATE 400-57 MG/5ML PO SUSR: 875.0000 mg | Freq: Two times a day (BID) | ORAL | 0 refills | Status: DC

## 2022-09-17 MED ORDER — PREDNISONE 10 MG PO TABS: ORAL_TABLET | ORAL | 0 refills | Status: DC

## 2022-09-17 NOTE — Patient Instructions (Signed)

## 2022-09-17 NOTE — Progress Notes (Signed)
HPI  Patient presents to clinic today with complaint of headache, right-sided facial pressure, runny nose, nasal congestion, sore throat and cough.  She reports this started 3 weeks ago.  She describes the headache as pressure.  She is blowing green mucus out of her nose.  She is having some difficulty swallowing.  The cough is productive of green mucus.  She denies ear pain, shortness of breath, chest pain, nausea, vomiting, diarrhea.  She denies fever, chills or body aches.  She has taken DayQuil, NyQuil and Delsym OTC with minimal relief of symptoms.  She has had sick contacts with similar symptoms who have been diagnosed with the flu.  Review of Systems     Past Medical History:  Diagnosis Date   Allergy    Asthma    Complication of anesthesia    hard to wake up post-op   Cyst of finger 07/2013   right long annular ligament cyst   Depression    Difficulty swallowing pills    Migraine    Multiple body piercings    nose and both ears - unsure if she can remove for surgery    Family History  Problem Relation Age of Onset   Healthy Mother    Breast cancer Maternal Grandmother    Heart disease Maternal Grandmother    Hypertension Maternal Grandmother    Cancer Paternal Grandmother        ovarian/uterine? unsure   Breast cancer Maternal Aunt    Healthy Half-Sister     Social History   Socioeconomic History   Marital status: Divorced    Spouse name: Not on file   Number of children: Not on file   Years of education: Not on file   Highest education level: Not on file  Occupational History   Not on file  Tobacco Use   Smoking status: Never   Smokeless tobacco: Never  Vaping Use   Vaping Use: Never used  Substance and Sexual Activity   Alcohol use: Yes    Comment: occasionally   Drug use: No   Sexual activity: Yes    Birth control/protection: Surgical  Other Topics Concern   Not on file  Social History Narrative   Not on file   Social Determinants of Health    Financial Resource Strain: Not on file  Food Insecurity: Not on file  Transportation Needs: Not on file  Physical Activity: Not on file  Stress: Not on file  Social Connections: Not on file  Intimate Partner Violence: Not on file    Allergies  Allergen Reactions   Imitrex [Sumatriptan Succinate] Rash and Other (See Comments)    FEELS LIKE SKIN IS BURNING   Bupropion Other (See Comments)    CAUSED A PANIC ATTACK     Constitutional: Positive headache, fatigue . Denies fever or abrupt weight changes.  HEENT:  Positive facial pain, runny nose, nasal congestion and sore throat. Denies eye redness, ear pain, ringing in the ears, wax buildup, or bloody nose. Respiratory: Positive cough. Denies difficulty breathing or shortness of breath.  Cardiovascular: Denies chest pain, chest tightness, palpitations or swelling in the hands or feet.   No other specific complaints in a complete review of systems (except as listed in HPI above).  Objective:  BP 116/70 (BP Location: Left Arm, Patient Position: Sitting, Cuff Size: Normal)   Pulse 79   Temp (!) 96.9 F (36.1 C) (Temporal)   Wt 245 lb (111.1 kg)   SpO2 98%   BMI 46.29 kg/m  General: Appears her stated age, obese in NAD. HEENT: Head: normal shape and size, right maxillary sinus tenderness noted; Eyes: sclera white, no icterus, conjunctiva pink;  Nose: mucosa boggy and moist, septum midline; Throat/Mouth: + PND. Teeth present, mucosa erythematous and moist, no exudate noted, no lesions or ulcerations noted.  Neck:  No adenopathy noted.  Cardiovascular: Normal rate and rhythm. S1,S2 noted.  No murmur, rubs or gallops noted.  Pulmonary/Chest: Normal effort and positive vesicular breath sounds. No respiratory distress. No wheezes, rales or ronchi noted.       Assessment & Plan:   Acute Bacterial Sinusitis  Can use a Neti Pot which can be purchased from your local drug store. Flonase 2 sprays each nostril for 3 days and then as  needed. Rx for Augmentin BID for 10 days Rx for Prednisone x 6 days Albuterol inhaler refilled  RTC  in 1 month for follow up chronic conditions Webb Silversmith, NP

## 2022-09-17 NOTE — Addendum Note (Signed)
Addended by: Ashley Royalty E on: 09/17/2022 09:15 AM   Modules accepted: Orders

## 2022-10-13 DIAGNOSIS — Z01419 Encounter for gynecological examination (general) (routine) without abnormal findings: Secondary | ICD-10-CM | POA: Diagnosis not present

## 2022-10-13 DIAGNOSIS — Z124 Encounter for screening for malignant neoplasm of cervix: Secondary | ICD-10-CM | POA: Diagnosis not present

## 2022-10-13 DIAGNOSIS — Z6841 Body Mass Index (BMI) 40.0 and over, adult: Secondary | ICD-10-CM | POA: Diagnosis not present

## 2022-10-13 LAB — HM PAP SMEAR: HM Pap smear: NORMAL

## 2022-10-14 ENCOUNTER — Encounter: Payer: Self-pay | Admitting: Internal Medicine

## 2022-10-27 DIAGNOSIS — Z1231 Encounter for screening mammogram for malignant neoplasm of breast: Secondary | ICD-10-CM | POA: Diagnosis not present

## 2023-03-05 ENCOUNTER — Encounter: Payer: Self-pay | Admitting: Emergency Medicine

## 2023-03-05 ENCOUNTER — Ambulatory Visit: Payer: Self-pay

## 2023-03-05 ENCOUNTER — Emergency Department
Admission: EM | Admit: 2023-03-05 | Discharge: 2023-03-05 | Disposition: A | Discharge status: Home / Self Care | Payer: BC Managed Care – PPO | Attending: Emergency Medicine | Admitting: Emergency Medicine

## 2023-03-05 ENCOUNTER — Other Ambulatory Visit: Payer: Self-pay

## 2023-03-05 ENCOUNTER — Emergency Department: Payer: BC Managed Care – PPO

## 2023-03-05 DIAGNOSIS — R918 Other nonspecific abnormal finding of lung field: Secondary | ICD-10-CM | POA: Diagnosis not present

## 2023-03-05 DIAGNOSIS — R600 Localized edema: Secondary | ICD-10-CM | POA: Diagnosis not present

## 2023-03-05 DIAGNOSIS — J45909 Unspecified asthma, uncomplicated: Secondary | ICD-10-CM | POA: Diagnosis not present

## 2023-03-05 DIAGNOSIS — M79605 Pain in left leg: Secondary | ICD-10-CM | POA: Diagnosis not present

## 2023-03-05 DIAGNOSIS — R9431 Abnormal electrocardiogram [ECG] [EKG]: Secondary | ICD-10-CM | POA: Diagnosis not present

## 2023-03-05 DIAGNOSIS — M79604 Pain in right leg: Secondary | ICD-10-CM | POA: Diagnosis not present

## 2023-03-05 DIAGNOSIS — I509 Heart failure, unspecified: Secondary | ICD-10-CM | POA: Diagnosis not present

## 2023-03-05 DIAGNOSIS — R2243 Localized swelling, mass and lump, lower limb, bilateral: Secondary | ICD-10-CM | POA: Diagnosis not present

## 2023-03-05 LAB — CBC
HCT: 39.5 % (ref 36.0–46.0)
Hemoglobin: 12.8 g/dL (ref 12.0–15.0)
MCH: 27.2 pg (ref 26.0–34.0)
MCHC: 32.4 g/dL (ref 30.0–36.0)
MCV: 84 fL (ref 80.0–100.0)
Platelets: 313 10*3/uL (ref 150–400)
RBC: 4.7 MIL/uL (ref 3.87–5.11)
RDW: 13.8 % (ref 11.5–15.5)
WBC: 7.5 10*3/uL (ref 4.0–10.5)
nRBC: 0 % (ref 0.0–0.2)

## 2023-03-05 LAB — BASIC METABOLIC PANEL
Anion gap: 7 (ref 5–15)
BUN: 14 mg/dL (ref 6–20)
CO2: 22 mmol/L (ref 22–32)
Calcium: 8.6 mg/dL — ABNORMAL LOW (ref 8.9–10.3)
Chloride: 107 mmol/L (ref 98–111)
Creatinine, Ser: 0.65 mg/dL (ref 0.44–1.00)
GFR, Estimated: >60 mL/min (ref >60)
Glucose, Bld: 92 mg/dL (ref 70–99)
Potassium: 4 mmol/L (ref 3.5–5.1)
Sodium: 136 mmol/L (ref 135–145)

## 2023-03-05 LAB — HEPATIC FUNCTION PANEL
ALT: 27 U/L (ref 0–44)
AST: 25 U/L (ref 15–41)
Albumin: 3.5 g/dL (ref 3.5–5.0)
Alkaline Phosphatase: 61 U/L (ref 38–126)
Bilirubin, Direct: <0.1 mg/dL (ref 0.0–0.2)
Total Bilirubin: 0.5 mg/dL (ref 0.3–1.2)
Total Protein: 6.9 g/dL (ref 6.5–8.1)

## 2023-03-05 MED ORDER — FUROSEMIDE 20 MG PO TABS: 20.0000 mg | ORAL_TABLET | Freq: Every day | ORAL | 0 refills | Status: DC

## 2023-03-05 NOTE — ED Triage Notes (Signed)
Patient to ED via POV for bilateral leg and hand/wrist swelling. States she fist noticed this around 4pm yesterday. Denies SOB.

## 2023-03-05 NOTE — ED Notes (Signed)
See triage notes. Patient c/o bilateral leg and hand/wrist swelling. Noticed it yesterday

## 2023-03-05 NOTE — ED Provider Notes (Signed)
[facility] Provider Note    Event Date/Time   First MD Initiated Contact with Patient 03/05/23 [phone]     (approximate)   History   Chief Complaint Leg Swelling (/)   HPI  Deanna Thomas is a 42 y.o. female with past medical history of hyperlipidemia, migraines, and asthma who presents to the ED complaining of leg swelling.  Patient reports that yesterday she noticed some swelling in both of her legs with associated discomfort in both calves.  When she woke up this morning, she noticed that the swelling had worsened and she also felt like she had swelling in both of her hands and forearms.  She denies any associated skin changes, has not had any chest pain or difficulty breathing.  She denies any history of similar symptoms, denies any history of CHF or DVT.  She has been urinating normally, does not take any diuretic medication.     Physical Exam   Triage Vital Signs: ED Triage Vitals  Enc Vitals Group     BP 03/05/23 0915 (!) 145/99     Pulse Rate 03/05/23 0915 75     Resp 03/05/23 0915 18     Temp 03/05/23 0915 98.4 F (36.9 C)     Temp Source 03/05/23 0915 Oral     SpO2 03/05/23 0915 95 %     Weight 03/05/23 0916 260 lb (117.9 kg)     Height 03/05/23 0916 5' (1.524 m)     Head Circumference --      Peak Flow --      Pain Score 03/05/23 0916 2     Pain Loc --      Pain Edu? --      Excl. in GC? --     Most recent vital signs: Vitals:   03/05/23 0915  BP: (!) 145/99  Pulse: 75  Resp: 18  Temp: 98.4 F (36.9 C)  SpO2: 95%    Constitutional: Alert and oriented. Eyes: Conjunctivae are normal. Head: Atraumatic. Nose: No congestion/rhinnorhea. Mouth/Throat: Mucous membranes are moist.  Cardiovascular: Normal rate, regular rhythm. Grossly normal heart sounds.  2+ radial and DP pulses bilaterally. Respiratory: Normal respiratory effort.  No retractions. Lungs CTAB. Gastrointestinal: Soft and nontender. No distention. Musculoskeletal:  1+ pitting edema to knees bilaterally with associated calf tenderness, no overlying erythema or warmth.  Minimal upper extremity edema noted with no tenderness, erythema, or warmth. Neurologic:  Normal speech and language. No gross focal neurologic deficits are appreciated.    ED Results / Procedures / Treatments   Labs (all labs ordered are listed, but only abnormal results are displayed) Labs Reviewed  BASIC METABOLIC PANEL - Abnormal; Notable for the following components:      Result Value   Calcium 8.6 (*)    All other components within normal limits  CBC  HEPATIC FUNCTION PANEL     EKG  ED ECG REPORT I, Chesley Noon, the attending physician, personally viewed and interpreted this ECG.   Date: 03/05/2023  EKG Time: 11:34  Rate: 61  Rhythm: normal sinus rhythm  Axis: Normal  Intervals:none  ST&T Change: None  RADIOLOGY Chest x-ray reviewed and interpreted by me with no infiltrate, edema, or effusion.  PROCEDURES:  Critical Care performed: No  Procedures   MEDICATIONS ORDERED IN ED: Medications - No data to display   IMPRESSION / MDM / ASSESSMENT AND PLAN / ED COURSE  I reviewed the triage vital signs and the nursing notes.  42 y.o. female with past medical history of hyperlipidemia, migraines, and asthma who presents to the ED complaining of increasing leg swelling and arm swelling over the past 24 hours.  Patient's presentation is most consistent with acute presentation with potential threat to life or bodily function.  Differential diagnosis includes, but is not limited to, CHF, DVT, AKI, liver dysfunction, electrolyte abnormality, venous insufficiency.  Patient well-appearing and in no acute distress, vital signs are unremarkable.  She has lower extremity edema and calf tenderness but remains neurovascular intact with strong DP pulses.  No overlying signs of infection, will check ultrasound for evidence of DVT.  Will also  screen chest x-ray for evidence of CHF.  Labs thus far reassuring with no significant anemia, leukocytosis, tract abnormality, or AKI.  We will add on LFTs.  Chest x-ray unremarkable with no evidence of CHF, lower extremity ultrasound is negative for DVT.  LFTs are also unremarkable, patient appropriate for outpatient management of suspected venous insufficiency.  Patient counseled on elevation, will also be prescribed short course of Lasix.  She was counseled to follow-up with her PCP and to return to the ED for new or worsening symptoms, patient agrees with plan.      FINAL CLINICAL IMPRESSION(S) / ED DIAGNOSES   Final diagnoses:  Peripheral edema     Rx / DC Orders   ED Discharge Orders          Ordered    furosemide (LASIX) 20 MG tablet  Daily        03/05/23 1325             Note:  This document was prepared using Dragon voice recognition software and may include unintentional dictation errors.   Chesley Noon, MD 03/05/23 1326

## 2023-03-05 NOTE — Telephone Encounter (Signed)
  Chief Complaint: Swollen legs, hands Symptoms: Above Frequency: Yesterday - getting worse overnight Pertinent Negatives: Patient denies SOB Disposition: [x] ED /[] Urgent Care (no appt availability in office) / [] Appointment(In office/virtual)/ []  [facility]/ [] Home Care/ [] Refused Recommended Disposition /[] [facility]/ []  Follow-up with PCP Additional Notes: Pt states that swelling started yesterday while at work, and has gotten progressively worse. Pt states that overnight she needed to get up and remove her rings. He toes are so swollen she feels like they are going to pop. Pt will go to ED for care. No OV until this afternoon when pt will not have transportation.    Reason for Disposition  SEVERE leg swelling (e.g., swelling extends above knee, entire leg is swollen, weeping fluid)  Answer Assessment - Initial Assessment Questions 1. ONSET: "When did the swelling start?" (e.g., minutes, hours, days)     yesterday 2. LOCATION: "What part of the leg is swollen?"  "Are both legs swollen or just one leg?"     Whole leg 3. SEVERITY: "How bad is the swelling?" (e.g., localized; mild, moderate, severe)   - Localized: Small area of swelling localized to one leg.   - MILD pedal edema: Swelling limited to foot and ankle, pitting edema < 1/4 inch (6 mm) deep, rest and elevation eliminate most or all swelling.   - MODERATE edema: Swelling of lower leg to knee, pitting edema > 1/4 inch (6 mm) deep, rest and elevation only partially reduce swelling.   - SEVERE edema: Swelling extends above knee, facial or hand swelling present.      severe 5. PAIN: "Is the swelling painful to touch?" If Yes, ask: "How painful is it?"   (Scale 1-10; mild, moderate or severe)     moderate 6. FEVER: "Do you have a fever?" If Yes, ask: "What is it, how was it measured, and when did it start?"      no 7. CAUSE: "What do you think is causing the leg swelling?"     unsure 8. MEDICAL HISTORY: "Do  you have a history of blood clots (e.g., DVT), cancer, heart failure, kidney disease, or liver failure?"     no 9. RECURRENT SYMPTOM: "Have you had leg swelling before?" If Yes, ask: "When was the last time?" "What happened that time?"     Yes - 18 years ago 10. OTHER SYMPTOMS: "Do you have any other symptoms?" (e.g., chest pain, difficulty breathing)       no  Protocols used: Leg Swelling and Edema-A-AH

## 2023-03-05 NOTE — Telephone Encounter (Signed)
I had a 10:00 open

## 2023-03-05 NOTE — ED Notes (Signed)
See triage note  Presents with swelling to both lower legs  Denies any pain but is having tingling to both lower legs

## 2023-03-10 ENCOUNTER — Ambulatory Visit (INDEPENDENT_AMBULATORY_CARE_PROVIDER_SITE_OTHER): Payer: BC Managed Care – PPO | Admitting: Internal Medicine

## 2023-03-10 ENCOUNTER — Encounter: Payer: Self-pay | Admitting: Internal Medicine

## 2023-03-10 VITALS — BP 116/74 | HR 63 | Temp 96.6°F | Wt 260.0 lb

## 2023-03-10 DIAGNOSIS — R531 Weakness: Secondary | ICD-10-CM

## 2023-03-10 DIAGNOSIS — R209 Unspecified disturbances of skin sensation: Secondary | ICD-10-CM | POA: Diagnosis not present

## 2023-03-10 DIAGNOSIS — E782 Mixed hyperlipidemia: Secondary | ICD-10-CM

## 2023-03-10 DIAGNOSIS — D510 Vitamin B12 deficiency anemia due to intrinsic factor deficiency: Secondary | ICD-10-CM | POA: Diagnosis not present

## 2023-03-10 DIAGNOSIS — R600 Localized edema: Secondary | ICD-10-CM | POA: Diagnosis not present

## 2023-03-10 DIAGNOSIS — R7309 Other abnormal glucose: Secondary | ICD-10-CM

## 2023-03-10 DIAGNOSIS — E559 Vitamin D deficiency, unspecified: Secondary | ICD-10-CM | POA: Diagnosis not present

## 2023-03-10 NOTE — Progress Notes (Signed)
Subjective:    [name], female    DOB: Oct 11, 1980, 42 y.o.   MRN: [id number]  HPI  Patient presents to clinic today for ER follow-up.  She presented to the ER 6/27 with complaint of lower extremity edema.  Labs were unremarkable.  ECG was normal.  Chest x-ray did not show any evidence of effusion or infiltrate.  Ultrasound was negative for DVT.  She was discharged with a short course of furosemide.  Since that time, she reports she still has persistent heaviness in her arms and legs, persistent swelling in her legs. She reports her legs are not swollen in when she wakes up in the morning but they are by the end of the day.  She reports some associated numbness and tingling in her legs but denies any neck or back pain.  She denies recent changes in diet or medication.  She has recently increased her activity level she has been trying to lose weight.  She has gained 15 pounds in the last 6 months.  Review of Systems     Past Medical History:  Diagnosis Date   Allergy    Asthma    Complication of anesthesia    hard to wake up post-op   Cyst of finger 07/2013   right long annular ligament cyst   Depression    Difficulty swallowing pills    Migraine    Multiple body piercings    nose and both ears - unsure if she can remove for surgery    Current Outpatient Medications  Medication Sig Dispense Refill   albuterol (VENTOLIN HFA) 108 (90 Base) MCG/ACT inhaler Inhale 2 puffs into the lungs every 6 (six) hours as needed for wheezing or shortness of breath. 8 g 0   atorvastatin (LIPITOR) 20 MG tablet TAKE 1 TABLET BY MOUTH EVERY DAY 90 tablet 2   furosemide (LASIX) 20 MG tablet Take 1 tablet (20 mg total) by mouth daily for 7 days. 7 tablet 0   ibuprofen (ADVIL) 200 MG tablet Take 200 mg by mouth every 6 (six) hours as needed for fever, headache or moderate pain.     loratadine (CLARITIN) 10 MG tablet Take 10 mg by mouth daily.     predniSONE (DELTASONE) 10 MG tablet  Take 6 tabs on day 1, 5 tabs on day 2, 4 tabs on day 3, 3 tabs on day 4, 2 tabs on day 5, 1 tab on day 6 21 tablet 0   No current facility-administered medications for this visit.    Allergies  Allergen Reactions   Imitrex [Sumatriptan Succinate] Rash and Other (See Comments)    FEELS LIKE SKIN IS BURNING   Bupropion Other (See Comments)    CAUSED A PANIC ATTACK    Family History  Problem Relation Age of Onset   Healthy Mother    Breast cancer Maternal Grandmother    Heart disease Maternal Grandmother    Hypertension Maternal Grandmother    Cancer Paternal Grandmother        ovarian/uterine? unsure   Breast cancer Maternal Aunt    Healthy Half-Sister     Social History   Socioeconomic History   Marital status: Married    Spouse name: Not on file   Number of children: Not on file   Years of education: Not on file   Highest education level: Not on file  Occupational History   Not on file  Tobacco Use   Smoking status: Never   Smokeless  tobacco: Never  Vaping Use   Vaping Use: Never used  Substance and Sexual Activity   Alcohol use: Yes    Comment: occasionally   Drug use: No   Sexual activity: Yes    Birth control/protection: Surgical  Other Topics Concern   Not on file  Social History Narrative   Not on file   Social Determinants of Health   Financial Resource Strain: Not on file  Food Insecurity: Not on file  Transportation Needs: Not on file  Physical Activity: Not on file  Stress: Not on file  Social Connections: Not on file  Intimate Partner Violence: Not on file     Constitutional: Patient reports intermittent headaches.  Denies fever, malaise, fatigue, or abrupt weight changes.  HEENT: Denies eye pain, eye redness, ear pain, ringing in the ears, wax buildup, runny nose, nasal congestion, bloody nose, or sore throat. Respiratory: Denies difficulty breathing, shortness of breath, cough or sputum production.   Cardiovascular: Patient reports  swelling in legs.  Denies chest pain, chest tightness, palpitations.  Gastrointestinal: Denies abdominal pain, bloating, constipation, diarrhea or blood in the stool.  GU: Denies urgency, frequency, pain with urination, burning sensation, blood in urine, odor or discharge. Musculoskeletal: Patient reports generalized weakness.  Denies decrease in range of motion, difficulty with gait, muscle pain or joint pain and swelling.  Skin: Denies redness, rashes, lesions or ulcercations.  Neurological: Patient reports paresthesia of upper and lower extremities.  Denies dizziness, difficulty with memory, difficulty with speech or problems with balance and coordination.  Psych: Denies anxiety, depression, SI/HI.  No other specific complaints in a complete review of systems (except as listed in HPI above).  Objective:   Physical Exam  BP 116/74 (BP Location: Left Arm, Patient Position: Sitting, Cuff Size: Large)   Pulse 63   Temp (!) 96.6 F (35.9 C) (Temporal)   Wt 260 lb (117.9 kg)   SpO2 97%   BMI 50.78 kg/m   Wt Readings from Last 3 Encounters:  03/05/23 260 lb (117.9 kg)  09/17/22 245 lb (111.1 kg)  08/04/22 251 lb 9.6 oz (114.1 kg)    General: Appears her stated age, obese in NAD. Skin: Warm, dry and intact.  HEENT: Head: normal shape and size; Eyes: sclera white, no icterus, conjunctiva pink, PERRLA and EOMs intact;  Cardiovascular: Normal rate and rhythm. S1,S2 noted.  No murmur, rubs or gallops noted. No JVD.  Trace nonpitting BLE edema.  Radial and pedal pulses 2+ bilaterally. Pulmonary/Chest: Normal effort and positive vesicular breath sounds. No respiratory distress. No wheezes, rales or ronchi noted.  Musculoskeletal: Strength 5/5 BUE/BLE.  Handgrips equal.  No signs of joint swelling. No difficulty with gait.  Neurological: Alert and oriented. Coordination normal.     BMET    Component Value Date/Time   NA 136 03/05/2023 0918   NA 142 06/27/2016 1412   NA 139 10/22/2011  1515   K 4.0 03/05/2023 0918   K 4.0 10/22/2011 1515   K 4.0 10/22/2011 1515   CL 107 03/05/2023 0918   CL 107 10/22/2011 1515   CL 107 10/22/2011 1515   CO2 22 03/05/2023 0918   CO2 27 10/22/2011 1515   CO2 27 10/22/2011 1515   GLUCOSE 92 03/05/2023 0918   GLUCOSE 85 10/22/2011 1515   BUN 14 03/05/2023 0918   BUN 13 06/27/2016 1412   BUN 14 10/22/2011 1515   CREATININE 0.65 03/05/2023 0918   CREATININE 0.62 02/10/2022 0832   CALCIUM 8.6 (L) 03/05/2023 1610  CALCIUM 8.9 10/22/2011 1515   CALCIUM 8.9 10/22/2011 1515   GFRNONAA >60 03/05/2023 0918   GFRNONAA >60 10/22/2011 1515   GFRAA >60 07/05/2016 0535   GFRAA >60 10/22/2011 1515    Lipid Panel     Component Value Date/Time   CHOL 148 05/09/2022 0854   CHOL 217 (H) 06/27/2016 1412   TRIG 153 (H) 05/09/2022 0854   HDL 42 (L) 05/09/2022 0854   HDL 37 (L) 06/27/2016 1412   CHOLHDL 3.5 05/09/2022 0854   LDLCALC 81 05/09/2022 0854    CBC    Component Value Date/Time   WBC 7.5 03/05/2023 0918   RBC 4.70 03/05/2023 0918   HGB 12.8 03/05/2023 0918   HGB 13.6 06/27/2016 1412   HCT 39.5 03/05/2023 0918   HCT 41.5 06/27/2016 1412   HCT 39 10/22/2011 1515   PLT 313 03/05/2023 0918   PLT 358 06/27/2016 1412   MCV 84.0 03/05/2023 0918   MCV 85 06/27/2016 1412   MCV 85 10/22/2011 1515   MCH 27.2 03/05/2023 0918   MCHC 32.4 03/05/2023 0918   RDW 13.8 03/05/2023 0918   RDW 14.0 06/27/2016 1412   RDW 13.6 10/22/2011 1515   LYMPHSABS 2.8 06/19/2020 1144   MONOABS 0.8 06/19/2020 1144   EOSABS 0.4 06/19/2020 1144   BASOSABS 0.1 06/19/2020 1144    Hgb A1C Lab Results  Component Value Date   HGBA1C 5.3 02/10/2022           Assessment & Plan:   ER follow-up for peripheral edema, generalized weakness, paresthesia of upper and lower extremity:  ER notes, labs and imaging reviewed Advised her that it is not uncommon to have some lower extremity edema during the summer months when the weather is hot We will see  how her swelling is next week after she has finished her furosemide prescription Reinforced DASH diet and exercise for weight loss as excess weight can make swelling worse Will check bmet, TSH, vitamin D, B12, ANA, ESR, CRP  Schedule an appointment for your annual exam Nicki Reaper, NP

## 2023-03-10 NOTE — Patient Instructions (Signed)
Edema  Edema is when you have too much fluid in your body or under your skin. Edema may make your legs, feet, and ankles swell. Swelling often happens in looser tissues, such as around your eyes. This is a common condition. It gets more common as you get older. There are many possible causes of edema. These include: Eating too much salt (sodium). Being on your feet or sitting for a long time. Certain medical conditions, such as: Pregnancy. Heart failure. Liver disease. Kidney disease. Cancer. Hot weather may make edema worse. Edema is usually painless. Your skin may look swollen or shiny. Follow these instructions at home: Medicines Take over-the-counter and prescription medicines only as told by your doctor. Your doctor may prescribe a medicine to help your body get rid of extra water (diuretic). Take this medicine if you are told to take it. Eating and drinking Eat a low-salt (low-sodium) diet as told by your doctor. Sometimes, eating less salt may reduce swelling. Depending on the cause of your swelling, you may need to limit how much fluid you drink (fluid restriction). General instructions Raise the injured area above the level of your heart while you are sitting or lying down. Do not sit still or stand for a long time. Do not wear tight clothes. Do not wear garters on your upper legs. Exercise your legs. This can help the swelling go down. Wear compression stockings as told by your doctor. It is important that these are the right size. These should be prescribed by your doctor to prevent possible injuries. If elastic bandages or wraps are recommended, use them as told by your doctor. Contact a doctor if: Treatment is not working. You have heart, liver, or kidney disease and have symptoms of edema. You have sudden and unexplained weight gain. Get help right away if: You have shortness of breath or chest pain. You cannot breathe when you lie down. You have pain, redness, or  warmth in the swollen areas. You have heart, liver, or kidney disease and get edema all of a sudden. You have a fever and your symptoms get worse all of a sudden. These symptoms may be an emergency. Get help right away. Call 911. Do not wait to see if the symptoms will go away. Do not drive yourself to the hospital. Summary Edema is when you have too much fluid in your body or under your skin. Edema may make your legs, feet, and ankles swell. Swelling often happens in looser tissues, such as around your eyes. Raise the injured area above the level of your heart while you are sitting or lying down. Follow your doctor's instructions about diet and how much fluid you can drink. This information is not intended to replace advice given to you by your health care provider. Make sure you discuss any questions you have with your health care provider. Document Revised: 04/29/2021 Document Reviewed: 04/29/2021 Elsevier Patient Education  2024 [employer]  

## 2023-03-11 LAB — BASIC METABOLIC PANEL WITH GFR
Calcium: 9.2 mg/dL (ref 8.6–10.2)
Chloride: 107 mmol/L (ref 98–110)
Potassium: 4.5 mmol/L (ref 3.5–5.3)
Sodium: 139 mmol/L (ref 135–146)

## 2023-03-11 LAB — LIPID PANEL
HDL: 50 mg/dL (ref >=50)
Total CHOL/HDL Ratio: 3.2 (calc) (ref <5.0)

## 2023-03-11 LAB — HEMOGLOBIN A1C
Hgb A1c MFr Bld: 5.5 % of total Hgb (ref <5.7)
eAG (mmol/L): 6.2 mmol/L

## 2023-03-11 LAB — TSH: TSH: 3.54 mIU/L

## 2023-03-12 LAB — BASIC METABOLIC PANEL WITH GFR
BUN: 19 mg/dL (ref 7–25)
CO2: 22 mmol/L (ref 20–32)
Creat: 0.67 mg/dL (ref 0.50–0.99)
Glucose, Bld: 86 mg/dL (ref 65–99)
eGFR: 113 mL/min/{1.73_m2} (ref >=60)

## 2023-03-12 LAB — LIPID PANEL
Cholesterol: 158 mg/dL (ref <200)
LDL Cholesterol (Calc): 82 mg/dL (calc)
Non-HDL Cholesterol (Calc): 108 mg/dL (calc) (ref <130)
Triglycerides: 160 mg/dL — ABNORMAL HIGH (ref <150)

## 2023-03-12 LAB — VITAMIN B12: Vitamin B-12: 332 pg/mL (ref 200–1100)

## 2023-03-12 LAB — SEDIMENTATION RATE: Sed Rate: 9 mm/h (ref 0–20)

## 2023-03-12 LAB — C-REACTIVE PROTEIN: CRP: 5 mg/L (ref <8.0)

## 2023-03-12 LAB — HEMOGLOBIN A1C: Mean Plasma Glucose: 111 mg/dL

## 2023-03-12 LAB — ANA: Anti Nuclear Antibody (ANA): NEGATIVE

## 2023-03-12 LAB — VITAMIN D 25 HYDROXY (VIT D DEFICIENCY, FRACTURES): Vit D, 25-Hydroxy: 29 ng/mL — ABNORMAL LOW (ref 30–100)

## 2023-03-18 ENCOUNTER — Encounter: Payer: BC Managed Care – PPO | Admitting: Internal Medicine

## 2023-03-18 NOTE — Progress Notes (Deleted)
Subjective:    Patient ID: Tera Helper, female    DOB: 09-Feb-1981, 42 y.o.   MRN: [id number]  HPI  Patient presents to clinic today for her annual exam.  Flu: 06/2020 Tetanus: 02/2022 COVID: Never Pap smear: 10/2022 Mammogram: 06/2020 Vision screening: Dentist:  Diet: Exercise:  Review of Systems     Past Medical History:  Diagnosis Date   Allergy    Asthma    Complication of anesthesia    hard to wake up post-op   Cyst of finger 07/2013   right long annular ligament cyst   Depression    Difficulty swallowing pills    Migraine    Multiple body piercings    nose and both ears - unsure if she can remove for surgery    Current Outpatient Medications  Medication Sig Dispense Refill   albuterol (VENTOLIN HFA) 108 (90 Base) MCG/ACT inhaler Inhale 2 puffs into the lungs every 6 (six) hours as needed for wheezing or shortness of breath. 8 g 0   atorvastatin (LIPITOR) 20 MG tablet TAKE 1 TABLET BY MOUTH EVERY DAY 90 tablet 2   furosemide (LASIX) 20 MG tablet Take 1 tablet (20 mg total) by mouth daily for 7 days. 7 tablet 0   ibuprofen (ADVIL) 200 MG tablet Take 200 mg by mouth every 6 (six) hours as needed for fever, headache or moderate pain.     loratadine (CLARITIN) 10 MG tablet Take 10 mg by mouth daily.     No current facility-administered medications for this visit.    Allergies  Allergen Reactions   Imitrex [Sumatriptan Succinate] Rash and Other (See Comments)    FEELS LIKE SKIN IS BURNING   Bupropion Other (See Comments)    CAUSED A PANIC ATTACK    Family History  Problem Relation Age of Onset   Healthy Mother    Breast cancer Maternal Grandmother    Heart disease Maternal Grandmother    Hypertension Maternal Grandmother    Cancer Paternal Grandmother        ovarian/uterine? unsure   Breast cancer Maternal Aunt    Healthy Half-Sister     Social History   Socioeconomic History   Marital status: Married    Spouse name: Not on file   Number  of children: Not on file   Years of education: Not on file   Highest education level: Not on file  Occupational History   Not on file  Tobacco Use   Smoking status: Never   Smokeless tobacco: Never  Vaping Use   Vaping Use: Never used  Substance and Sexual Activity   Alcohol use: Yes    Comment: occasionally   Drug use: No   Sexual activity: Yes    Birth control/protection: Surgical  Other Topics Concern   Not on file  Social History Narrative   Not on file   Social Determinants of Health   Financial Resource Strain: Not on file  Food Insecurity: Not on file  Transportation Needs: Not on file  Physical Activity: Not on file  Stress: Not on file  Social Connections: Not on file  Intimate Partner Violence: Not on file     Constitutional: Patient reports intermittent headaches.  Denies fever, malaise, fatigue, or abrupt weight changes.  HEENT: Denies eye pain, eye redness, ear pain, ringing in the ears, wax buildup, runny nose, nasal congestion, bloody nose, or sore throat. Respiratory: Denies difficulty breathing, shortness of breath, cough or sputum production.   Cardiovascular: Denies chest pain,  chest tightness, palpitations or swelling in the hands or feet.  Gastrointestinal: Denies abdominal pain, bloating, constipation, diarrhea or blood in the stool.  GU: Denies urgency, frequency, pain with urination, burning sensation, blood in urine, odor or discharge. Musculoskeletal: Patient reports bilateral foot pain.  Denies decrease in range of motion, difficulty with gait, muscle pain or joint swelling.  Skin: Denies redness, rashes, lesions or ulcercations.  Neurological: Patient reports paresthesias of upper extremities.  Denies dizziness, difficulty with memory, difficulty with speech or problems with balance and coordination.  Psych: Denies anxiety, depression, SI/HI.  No other specific complaints in a complete review of systems (except as listed in HPI  above).  Objective:   Physical Exam   There were no vitals taken for this visit. Wt Readings from Last 3 Encounters:  03/10/23 260 lb (117.9 kg)  03/05/23 260 lb (117.9 kg)  09/17/22 245 lb (111.1 kg)    General: Appears their stated age, well developed, well nourished in NAD. Skin: Warm, dry and intact. No rashes, lesions or ulcerations noted. HEENT: Head: normal shape and size; Eyes: sclera white, no icterus, conjunctiva pink, PERRLA and EOMs intact; Ears: Tm's gray and intact, normal light reflex; Nose: mucosa pink and moist, septum midline; Throat/Mouth: Teeth present, mucosa pink and moist, no exudate, lesions or ulcerations noted.  Neck:  Neck supple, trachea midline. No masses, lumps or thyromegaly present.  Cardiovascular: Normal rate and rhythm. S1,S2 noted.  No murmur, rubs or gallops noted. No JVD or BLE edema. No carotid bruits noted. Pulmonary/Chest: Normal effort and positive vesicular breath sounds. No respiratory distress. No wheezes, rales or ronchi noted.  Abdomen: Soft and nontender. Normal bowel sounds. No distention or masses noted. Liver, spleen and kidneys non palpable. Musculoskeletal: Normal range of motion. No signs of joint swelling. No difficulty with gait.  Neurological: Alert and oriented. Cranial nerves II-XII grossly intact. Coordination normal.  Psychiatric: Mood and affect normal. Behavior is normal. Judgment and thought content normal.    BMET    Component Value Date/Time   NA 139 03/10/2023 0922   NA 142 06/27/2016 1412   NA 139 10/22/2011 1515   K 4.5 03/10/2023 0922   K 4.0 10/22/2011 1515   K 4.0 10/22/2011 1515   CL 107 03/10/2023 0922   CL 107 10/22/2011 1515   CL 107 10/22/2011 1515   CO2 22 03/10/2023 0922   CO2 27 10/22/2011 1515   CO2 27 10/22/2011 1515   GLUCOSE 86 03/10/2023 0922   GLUCOSE 85 10/22/2011 1515   BUN 19 03/10/2023 0922   BUN 13 06/27/2016 1412   BUN 14 10/22/2011 1515   CREATININE 0.67 03/10/2023 0922   CALCIUM  9.2 03/10/2023 0922   CALCIUM 8.9 10/22/2011 1515   CALCIUM 8.9 10/22/2011 1515   GFRNONAA >60 03/05/2023 0918   GFRNONAA >60 10/22/2011 1515   GFRAA >60 07/05/2016 0535   GFRAA >60 10/22/2011 1515    Lipid Panel     Component Value Date/Time   CHOL 158 03/10/2023 0922   CHOL 217 (H) 06/27/2016 1412   TRIG 160 (H) 03/10/2023 0922   HDL 50 03/10/2023 0922   HDL 37 (L) 06/27/2016 1412   CHOLHDL 3.2 03/10/2023 0922   LDLCALC 82 03/10/2023 0922    CBC    Component Value Date/Time   WBC 7.5 03/05/2023 0918   RBC 4.70 03/05/2023 0918   HGB 12.8 03/05/2023 0918   HGB 13.6 06/27/2016 1412   HCT 39.5 03/05/2023 0918   HCT 41.5  06/27/2016 1412   HCT 39 10/22/2011 1515   PLT 313 03/05/2023 0918   PLT 358 06/27/2016 1412   MCV 84.0 03/05/2023 0918   MCV 85 06/27/2016 1412   MCV 85 10/22/2011 1515   MCH 27.2 03/05/2023 0918   MCHC 32.4 03/05/2023 0918   RDW 13.8 03/05/2023 0918   RDW 14.0 06/27/2016 1412   RDW 13.6 10/22/2011 1515   LYMPHSABS 2.8 06/19/2020 1144   MONOABS 0.8 06/19/2020 1144   EOSABS 0.4 06/19/2020 1144   BASOSABS 0.1 06/19/2020 1144    Hgb A1C Lab Results  Component Value Date   HGBA1C 5.5 03/10/2023           Assessment & Plan:   Preventative health maintenance:  Encouraged her to get a flu shot in the fall Tetanus UTD Sure to get her COVID vaccine Pap smear UTD Mammogram ordered-she will call to schedule Encourage her to consume a balanced diet and exercise regimen Advised her to see an eye doctor and dentist annually Recent labs reviewed  RTC in 6 months, follow-up chronic conditions Nicki Reaper, NP

## 2023-03-25 ENCOUNTER — Encounter: Payer: Self-pay | Admitting: Internal Medicine

## 2023-03-25 ENCOUNTER — Ambulatory Visit (INDEPENDENT_AMBULATORY_CARE_PROVIDER_SITE_OTHER): Payer: BC Managed Care – PPO | Admitting: Internal Medicine

## 2023-03-25 VITALS — BP 132/84 | HR 88 | Temp 95.9°F | Ht 61.0 in | Wt 262.0 lb

## 2023-03-25 DIAGNOSIS — Z0001 Encounter for general adult medical examination with abnormal findings: Secondary | ICD-10-CM

## 2023-03-25 DIAGNOSIS — Z1231 Encounter for screening mammogram for malignant neoplasm of breast: Secondary | ICD-10-CM | POA: Diagnosis not present

## 2023-03-25 DIAGNOSIS — R600 Localized edema: Secondary | ICD-10-CM | POA: Diagnosis not present

## 2023-03-25 DIAGNOSIS — Z6841 Body Mass Index (BMI) 40.0 and over, adult: Secondary | ICD-10-CM

## 2023-03-25 MED ORDER — FUROSEMIDE 20 MG PO TABS: 20.0000 mg | ORAL_TABLET | Freq: Every day | ORAL | 1 refills | Status: DC | PRN

## 2023-03-25 NOTE — Patient Instructions (Signed)

## 2023-03-25 NOTE — Progress Notes (Signed)
Subjective:    [name], female    DOB: March 24, 1981, 42 y.o.   MRN: [id number]  HPI  Patient presents to clinic today for her annual exam.  Flu: 06/2020 Tetanus: 02/2022 COVID: Sherlie Ban x 1 Pap smear: 10/2022 Mammogram: 07/2020 Vision screening: annually Dentist: biannually  Diet: She does eat meat. She consumes fruits and veggies. She does eat fried foods. She drinks mostly coffee, water, sweet tea and soda. Exercise: None  Review of Systems  Past Medical History:  Diagnosis Date   Allergy    Asthma    Complication of anesthesia    hard to wake up post-op   Cyst of finger 07/2013   right long annular ligament cyst   Depression    Difficulty swallowing pills    Migraine    Multiple body piercings    nose and both ears - unsure if she can remove for surgery    Current Outpatient Medications  Medication Sig Dispense Refill   albuterol (VENTOLIN HFA) 108 (90 Base) MCG/ACT inhaler Inhale 2 puffs into the lungs every 6 (six) hours as needed for wheezing or shortness of breath. 8 g 0   atorvastatin (LIPITOR) 20 MG tablet TAKE 1 TABLET BY MOUTH EVERY DAY 90 tablet 2   furosemide (LASIX) 20 MG tablet Take 1 tablet (20 mg total) by mouth daily for 7 days. 7 tablet 0   ibuprofen (ADVIL) 200 MG tablet Take 200 mg by mouth every 6 (six) hours as needed for fever, headache or moderate pain.     loratadine (CLARITIN) 10 MG tablet Take 10 mg by mouth daily.     No current facility-administered medications for this visit.    Allergies  Allergen Reactions   Imitrex [Sumatriptan Succinate] Rash and Other (See Comments)    FEELS LIKE SKIN IS BURNING   Bupropion Other (See Comments)    CAUSED A PANIC ATTACK    Family History  Problem Relation Age of Onset   Healthy Mother    Breast cancer Maternal Grandmother    Heart disease Maternal Grandmother    Hypertension Maternal Grandmother    Cancer Paternal Grandmother        ovarian/uterine? unsure   Breast  cancer Maternal Aunt    Healthy Half-Sister     Social History   Socioeconomic History   Marital status: Married    Spouse name: Not on file   Number of children: Not on file   Years of education: Not on file   Highest education level: Not on file  Occupational History   Not on file  Tobacco Use   Smoking status: Never   Smokeless tobacco: Never  Vaping Use   Vaping status: Never Used  Substance and Sexual Activity   Alcohol use: Yes    Comment: occasionally   Drug use: No   Sexual activity: Yes    Birth control/protection: Surgical  Other Topics Concern   Not on file  Social History Narrative   Not on file   Social Determinants of Health   Financial Resource Strain: Not on file  Food Insecurity: Not on file  Transportation Needs: Not on file  Physical Activity: Not on file  Stress: Not on file  Social Connections: Not on file  Intimate Partner Violence: Not on file     Constitutional: Patient reports intermittent headaches.  Denies fever, malaise, fatigue, or abrupt weight changes.  HEENT: Denies eye pain, eye redness, ear pain, ringing in the ears, wax buildup, runny  nose, nasal congestion, bloody nose, or sore throat. Respiratory: Denies difficulty breathing, shortness of breath, cough or sputum production.   Cardiovascular: Pt reports intermittent swelling in legs. Denies chest pain, chest tightness, palpitations or swelling in the hands.  Gastrointestinal: Denies abdominal pain, bloating, constipation, diarrhea or blood in the stool.  GU: Denies urgency, frequency, pain with urination, burning sensation, blood in urine, odor or discharge. Musculoskeletal: Denies decrease in range of motion, difficulty with gait, muscle pain or joint pain and swelling.  Skin: Denies redness, rashes, lesions or ulcercations.  Neurological: Patient reports paresthesia of hands.  Denies dizziness, difficulty with memory, difficulty with speech or problems with balance and  coordination.  Psych: Denies anxiety, depression, SI/HI.  No other specific complaints in a complete review of systems (except as listed in HPI above).     Objective:   Physical Exam  BP 132/84 (BP Location: Left Arm, Patient Position: Sitting, Cuff Size: Large)   Pulse 88   Temp (!) 95.9 F (35.5 C) (Temporal)   Ht 5\' 1"  (1.549 m)   Wt 262 lb (118.8 kg)   SpO2 97%   BMI 49.50 kg/m   Wt Readings from Last 3 Encounters:  03/10/23 260 lb (117.9 kg)  03/05/23 260 lb (117.9 kg)  09/17/22 245 lb (111.1 kg)    General: Appears her stated age, obese, in NAD. Skin: Warm, dry and intact. HEENT: Head: normal shape and size; Eyes: sclera white, no icterus, conjunctiva pink, PERRLA and EOMs intact;  Neck:  Neck supple, trachea midline. No masses, lumps or thyromegaly present.  Cardiovascular: Normal rate and rhythm. S1,S2 noted.  No murmur, rubs or gallops noted. No JVD.  Trace BLE edema. Pulmonary/Chest: Normal effort and positive vesicular breath sounds. No respiratory distress. No wheezes, rales or ronchi noted.  Abdomen: Soft and nontender. Normal bowel sounds.  Musculoskeletal: Strength 5/5 BUE/BLE.  No difficulty with gait.  Neurological: Alert and oriented. Cranial nerves II-XII grossly intact. Coordination normal.  Psychiatric: Mood and affect normal. Behavior is normal. Judgment and thought content normal.     BMET    Component Value Date/Time   NA 139 03/10/2023 0922   NA 142 06/27/2016 1412   NA 139 10/22/2011 1515   K 4.5 03/10/2023 0922   K 4.0 10/22/2011 1515   K 4.0 10/22/2011 1515   CL 107 03/10/2023 0922   CL 107 10/22/2011 1515   CL 107 10/22/2011 1515   CO2 22 03/10/2023 0922   CO2 27 10/22/2011 1515   CO2 27 10/22/2011 1515   GLUCOSE 86 03/10/2023 0922   GLUCOSE 85 10/22/2011 1515   BUN 19 03/10/2023 0922   BUN 13 06/27/2016 1412   BUN 14 10/22/2011 1515   CREATININE 0.67 03/10/2023 0922   CALCIUM 9.2 03/10/2023 0922   CALCIUM 8.9 10/22/2011 1515    CALCIUM 8.9 10/22/2011 1515   GFRNONAA >60 03/05/2023 0918   GFRNONAA >60 10/22/2011 1515   GFRAA >60 07/05/2016 0535   GFRAA >60 10/22/2011 1515    Lipid Panel     Component Value Date/Time   CHOL 158 03/10/2023 0922   CHOL 217 (H) 06/27/2016 1412   TRIG 160 (H) 03/10/2023 0922   HDL 50 03/10/2023 0922   HDL 37 (L) 06/27/2016 1412   CHOLHDL 3.2 03/10/2023 0922   LDLCALC 82 03/10/2023 0922    CBC    Component Value Date/Time   WBC 7.5 03/05/2023 0918   RBC 4.70 03/05/2023 0918   HGB 12.8 03/05/2023 0918  HGB 13.6 06/27/2016 1412   HCT 39.5 03/05/2023 0918   HCT 41.5 06/27/2016 1412   HCT 39 10/22/2011 1515   PLT 313 03/05/2023 0918   PLT 358 06/27/2016 1412   MCV 84.0 03/05/2023 0918   MCV 85 06/27/2016 1412   MCV 85 10/22/2011 1515   MCH 27.2 03/05/2023 0918   MCHC 32.4 03/05/2023 0918   RDW 13.8 03/05/2023 0918   RDW 14.0 06/27/2016 1412   RDW 13.6 10/22/2011 1515   LYMPHSABS 2.8 06/19/2020 1144   MONOABS 0.8 06/19/2020 1144   EOSABS 0.4 06/19/2020 1144   BASOSABS 0.1 06/19/2020 1144    Hgb A1C Lab Results  Component Value Date   HGBA1C 5.5 03/10/2023            Assessment & Plan:   Preventative health maintenance:  Encouraged her to get a flu shot in the fall Tetanus UTD Encourage her to get her COVID-vaccine Pap smear UTD Mammogram ordered-she will call to schedule Encouraged her to consume a balanced diet and exercise regimen Advised her to see an eye doctor and dentist annually Recent labs reviewed  RTC in 6 months, follow-up chronic conditions Nicki Reaper, NP

## 2023-03-25 NOTE — Assessment & Plan Note (Signed)
 Encouraged diet and exercise for weight loss ?

## 2023-04-17 ENCOUNTER — Ambulatory Visit
Admission: RE | Admit: 2023-04-17 | Discharge: 2023-04-17 | Disposition: A | Discharge status: Home / Self Care | Payer: BC Managed Care – PPO | Source: Ambulatory Visit | Attending: Internal Medicine | Admitting: Internal Medicine

## 2023-04-17 DIAGNOSIS — Z1231 Encounter for screening mammogram for malignant neoplasm of breast: Secondary | ICD-10-CM

## 2023-04-19 ENCOUNTER — Emergency Department
Admission: EM | Admit: 2023-04-19 | Discharge: 2023-04-19 | Disposition: A | Discharge status: Home / Self Care | Payer: BC Managed Care – PPO | Attending: Emergency Medicine | Admitting: Emergency Medicine

## 2023-04-19 ENCOUNTER — Other Ambulatory Visit: Payer: Self-pay

## 2023-04-19 ENCOUNTER — Encounter: Payer: Self-pay | Admitting: Emergency Medicine

## 2023-04-19 ENCOUNTER — Emergency Department: Payer: BC Managed Care – PPO

## 2023-04-19 DIAGNOSIS — M25531 Pain in right wrist: Secondary | ICD-10-CM | POA: Diagnosis not present

## 2023-04-19 DIAGNOSIS — M25572 Pain in left ankle and joints of left foot: Secondary | ICD-10-CM | POA: Diagnosis not present

## 2023-04-19 DIAGNOSIS — R110 Nausea: Secondary | ICD-10-CM | POA: Insufficient documentation

## 2023-04-19 DIAGNOSIS — J45909 Unspecified asthma, uncomplicated: Secondary | ICD-10-CM | POA: Diagnosis not present

## 2023-04-19 DIAGNOSIS — Z041 Encounter for examination and observation following transport accident: Secondary | ICD-10-CM | POA: Diagnosis not present

## 2023-04-19 DIAGNOSIS — Y92410 Unspecified street and highway as the place of occurrence of the external cause: Secondary | ICD-10-CM | POA: Insufficient documentation

## 2023-04-19 DIAGNOSIS — M25532 Pain in left wrist: Secondary | ICD-10-CM | POA: Diagnosis not present

## 2023-04-19 DIAGNOSIS — R079 Chest pain, unspecified: Secondary | ICD-10-CM | POA: Diagnosis not present

## 2023-04-19 MED ORDER — HYDROCODONE-ACETAMINOPHEN 5-325 MG PO TABS
1.0000 | ORAL_TABLET | Freq: Once | ORAL | Status: AC
  Administered 2023-04-19: 1 via ORAL
  Filled 2023-04-19: qty 1

## 2023-04-19 MED ORDER — CYCLOBENZAPRINE HCL 10 MG PO TABS: 10.0000 mg | ORAL_TABLET | Freq: Three times a day (TID) | ORAL | 0 refills | Status: DC | PRN
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
## 2023-04-19 NOTE — Discharge Instructions (Addendum)
Your x-rays of your wrist and ankle are normal.  Your chest x-ray reveals no acute heart or lung injury.  You will need to receive a lot of rest.  A muscle relaxer is sent to your pharmacy.  Follow-up with orthopedics in 1 to 2 weeks for repeat x-ray of right wrist to rule out occult fracture.

## 2023-04-23 DIAGNOSIS — S93432A Sprain of tibiofibular ligament of left ankle, initial encounter: Secondary | ICD-10-CM | POA: Diagnosis not present

## 2023-05-15 ENCOUNTER — Other Ambulatory Visit: Payer: Self-pay | Admitting: Internal Medicine

## 2023-05-15 DIAGNOSIS — M76822 Posterior tibial tendinitis, left leg: Secondary | ICD-10-CM | POA: Diagnosis not present

## 2023-05-15 DIAGNOSIS — M25572 Pain in left ankle and joints of left foot: Secondary | ICD-10-CM | POA: Diagnosis not present

## 2023-05-15 DIAGNOSIS — S93492D Sprain of other ligament of left ankle, subsequent encounter: Secondary | ICD-10-CM | POA: Diagnosis not present

## 2023-05-15 NOTE — Telephone Encounter (Signed)
Requested Prescriptions  Pending Prescriptions Disp Refills   atorvastatin (LIPITOR) 20 MG tablet [Pharmacy Med Name: ATORVASTATIN 20 MG TABLET] 90 tablet 3    Sig: TAKE 1 TABLET BY MOUTH EVERY DAY     Cardiovascular:  Antilipid - Statins Failed - 05/15/2023  3:05 AM      Failed - Lipid Panel in normal range within the last 12 months    Cholesterol, Total  Date Value Ref Range Status  06/27/2016 217 (H) 100 - 199 mg/dL Final   Cholesterol  Date Value Ref Range Status  03/10/2023 158 <200 mg/dL Final   LDL Cholesterol (Calc)  Date Value Ref Range Status  03/10/2023 82 mg/dL (calc) Final    Comment:    Reference range: <100 . Desirable range <100 mg/dL for primary prevention;   <70 mg/dL for patients with CHD or diabetic patients  with > or = 2 CHD risk factors. Marland Kitchen LDL-C is now calculated using the Martin-Hopkins  calculation, which is a validated novel method providing  better accuracy than the Friedewald equation in the  estimation of LDL-C.  Horald Pollen et al. Lenox Ahr. 4098;119(14): 2061-2068  (http://education.[facility].com/faq/FAQ164)    HDL  Date Value Ref Range Status  03/10/2023 50 > OR = 50 mg/dL Final  78/29/5621 37 (L) >39 mg/dL Final   Triglycerides  Date Value Ref Range Status  03/10/2023 160 (H) <150 mg/dL Final         Passed - Patient is not pregnant      Passed - Valid encounter within last 12 months    Recent Outpatient Visits           1 month ago Encounter for general adult medical examination with abnormal findings   [facility] [place], Salvadore Oxford, NP   2 months ago Generalized weakness   [facility] [place], Salvadore Oxford, NP   8 months ago Acute bacterial sinusitis   [facility] [place], Salvadore Oxford, NP   9 months ago Eustachian tube dysfunction, bilateral   [facility] [place], Netta Neat, DO   1 year ago Irritated nevus of neck   [facility] [place], Salvadore Oxford, NP       Future Appointments             In 4 months Baity, Salvadore Oxford, NP  [facility]

## 2023-05-19 ENCOUNTER — Other Ambulatory Visit: Payer: Self-pay | Admitting: Internal Medicine

## 2023-05-19 ENCOUNTER — Other Ambulatory Visit: Payer: Self-pay

## 2023-05-19 DIAGNOSIS — Z021 Encounter for pre-employment examination: Secondary | ICD-10-CM

## 2023-05-19 NOTE — Progress Notes (Signed)
Pt completed pre-employment UDS, Cleared HR notified. Gretel Acre

## 2023-05-20 NOTE — Telephone Encounter (Signed)
Requested Prescriptions  Pending Prescriptions Disp Refills   furosemide (LASIX) 20 MG tablet [Pharmacy Med Name: FUROSEMIDE 20 MG TABLET] 30 tablet 1    Sig: TAKE 1 TABLET BY MOUTH EVERY DAY AS NEEDED     Cardiovascular:  Diuretics - Loop Failed - 05/19/2023  2:35 AM      Failed - Mg Level in normal range and within 180 days    No results found for: "MG"       Failed - Last BP in normal range    BP Readings from Last 1 Encounters:  04/19/23 (!) 142/86         Passed - K in normal range and within 180 days    Potassium  Date Value Ref Range Status  03/10/2023 4.5 3.5 - 5.3 mmol/L Final  10/22/2011 4.0 mmol/L Final  10/22/2011 4.0 3.5 - 5.1 mmol/L Final         Passed - Ca in normal range and within 180 days    Calcium  Date Value Ref Range Status  03/10/2023 9.2 8.6 - 10.2 mg/dL Final  60/06/9322 8.9 mg/dL Final   Calcium, Total  Date Value Ref Range Status  10/22/2011 8.9 8.5 - 10.1 mg/dL Final         Passed - Na in normal range and within 180 days    Sodium  Date Value Ref Range Status  03/10/2023 139 135 - 146 mmol/L Final  06/27/2016 142 134 - 144 mmol/L Final  10/22/2011 139 136 - 145 mmol/L Final         Passed - Cr in normal range and within 180 days    Creat  Date Value Ref Range Status  03/10/2023 0.67 0.50 - 0.99 mg/dL Final         Passed - Cl in normal range and within 180 days    Chloride  Date Value Ref Range Status  03/10/2023 107 98 - 110 mmol/L Final  10/22/2011 107 mmol/L Final  10/22/2011 107 98 - 107 mmol/L Final         Passed - Valid encounter within last 6 months    Recent Outpatient Visits           1 month ago Encounter for general adult medical examination with abnormal findings   [facility] [place], Salvadore Oxford, NP   2 months ago Generalized weakness   [facility] [place], Salvadore Oxford, NP   8 months ago Acute bacterial sinusitis   [place] [facility] [place],  Salvadore Oxford, NP   9 months ago Eustachian tube dysfunction, bilateral   [place] [facility] Smitty Cords, DO   1 year ago Irritated nevus of neck   [place] [facility] [place], Salvadore Oxford, NP       Future Appointments             In 4 months Baity, Salvadore Oxford, NP  [facility], [facility]

## 2023-09-15 DIAGNOSIS — M67431 Ganglion, right wrist: Secondary | ICD-10-CM | POA: Diagnosis not present

## 2023-09-25 ENCOUNTER — Ambulatory Visit: Payer: BC Managed Care – PPO | Admitting: Internal Medicine

## 2023-10-27 ENCOUNTER — Ambulatory Visit (INDEPENDENT_AMBULATORY_CARE_PROVIDER_SITE_OTHER): Payer: BC Managed Care – PPO | Admitting: Internal Medicine

## 2023-10-27 ENCOUNTER — Encounter: Payer: Self-pay | Admitting: Internal Medicine

## 2023-10-27 VITALS — BP 108/62 | Ht 60.0 in | Wt 255.0 lb

## 2023-10-27 DIAGNOSIS — Z6841 Body Mass Index (BMI) 40.0 and over, adult: Secondary | ICD-10-CM

## 2023-10-27 DIAGNOSIS — J4520 Mild intermittent asthma, uncomplicated: Secondary | ICD-10-CM

## 2023-10-27 DIAGNOSIS — E66813 Obesity, class 3: Secondary | ICD-10-CM | POA: Diagnosis not present

## 2023-10-27 DIAGNOSIS — Z23 Encounter for immunization: Secondary | ICD-10-CM

## 2023-10-27 DIAGNOSIS — G5603 Carpal tunnel syndrome, bilateral upper limbs: Secondary | ICD-10-CM

## 2023-10-27 DIAGNOSIS — E782 Mixed hyperlipidemia: Secondary | ICD-10-CM

## 2023-10-27 DIAGNOSIS — M722 Plantar fascial fibromatosis: Secondary | ICD-10-CM

## 2023-10-27 DIAGNOSIS — G43019 Migraine without aura, intractable, without status migrainosus: Secondary | ICD-10-CM | POA: Diagnosis not present

## 2023-10-27 NOTE — Progress Notes (Signed)
 Subjective:    [name], female    DOB: Sep 11, 1980, 43 y.o.   MRN: [id number]  HPI  Patient presents to clinic today for follow-up of chronic conditions.  Migraines: These occur rarely.  Triggered by stress.  She takes tylenol, excedrin migraine as needed with some relief of symptoms.  She does not follow with neurology.  Asthma: Mild, intermittent.  She denies chronic cough but has intermittent shortness of breath. Managed with albuterol as needed but she reports she has not had an inhaler filled recently.  There are no PFTs on file.  She does not follow with pulmonology.  HLD: Her last LDL was 82, triglycerides 914, 03/2023.  She denies myalgias on atorvastatin.  She tries to consume a low-fat diet.  Carpal tunnel: She is not currently taking any medication for this. She wears wrist splints She does not follow with neurology but does follow with hand specialist.  Plantar fasciitis: She reports this has been better lately. Managed with ibuprofen as needed.  She does not follow with podiatry.  CVI: Resolved. She no longer takes furosemide.  She does not follow with vascular.  She also reports feeling dehydrated.  She reports she knows she is dehydrated because she can "feel it", evidenced by dry skin.  She knows that she does not drink enough fluids throughout the day.  She reports she typically has to force herself to drink water or tea.  Review of Systems  Past Medical History:  Diagnosis Date   Allergy    Asthma    Complication of anesthesia    hard to wake up post-op   Cyst of finger 07/2013   right long annular ligament cyst   Depression    Difficulty swallowing pills    Migraine    Multiple body piercings    nose and both ears - unsure if she can remove for surgery    Current Outpatient Medications  Medication Sig Dispense Refill   albuterol (VENTOLIN HFA) 108 (90 Base) MCG/ACT inhaler Inhale 2 puffs into the lungs every 6 (six) hours as needed for  wheezing or shortness of breath. 8 g 0   atorvastatin (LIPITOR) 20 MG tablet TAKE 1 TABLET BY MOUTH EVERY DAY 90 tablet 3   cyclobenzaprine (FLEXERIL) 10 MG tablet Take 1 tablet (10 mg total) by mouth 3 (three) times daily as needed for muscle spasms. 30 tablet 0   furosemide (LASIX) 20 MG tablet TAKE 1 TABLET BY MOUTH EVERY DAY AS NEEDED 30 tablet 1   ibuprofen (ADVIL) 200 MG tablet Take 200 mg by mouth every 6 (six) hours as needed for fever, headache or moderate pain.     loratadine (CLARITIN) 10 MG tablet Take 10 mg by mouth daily.     No current facility-administered medications for this visit.    Allergies  Allergen Reactions   Imitrex [Sumatriptan Succinate] Rash and Other (See Comments)    FEELS LIKE SKIN IS BURNING   Bupropion Other (See Comments)    CAUSED A PANIC ATTACK    Family History  Problem Relation Age of Onset   Cataracts Mother    Hypertension Mother    Breast cancer Maternal Grandmother    Heart disease Maternal Grandmother    Hypertension Maternal Grandmother    Diabetes Maternal Grandmother    Cancer Paternal Grandmother        ovarian/uterine? unsure   Breast cancer Maternal Aunt    Healthy Half-Sister     Social History  Socioeconomic History   Marital status: Married    Spouse name: Not on file   Number of children: Not on file   Years of education: Not on file   Highest education level: Not on file  Occupational History   Not on file  Tobacco Use   Smoking status: Never   Smokeless tobacco: Never  Vaping Use   Vaping status: Never Used  Substance and Sexual Activity   Alcohol use: Yes    Comment: occasionally   Drug use: No   Sexual activity: Yes    Birth control/protection: Surgical  Other Topics Concern   Not on file  Social History Narrative   Not on file   Social Drivers of Health   Financial Resource Strain: Not on file  Food Insecurity: Not on file  Transportation Needs: Not on file  Physical Activity: Not on file   Stress: Not on file  Social Connections: Not on file  Intimate Partner Violence: Not on file     Constitutional: Patient reports intermittent headaches.  Denies fever, malaise, fatigue, or abrupt weight changes.  HEENT: Denies eye pain, eye redness, ear pain, ringing in the ears, wax buildup, runny nose, nasal congestion, bloody nose, or sore throat. Respiratory: Denies difficulty breathing, shortness of breath, cough or sputum production.   Cardiovascular: Pt reports intermittent swelling in legs. Denies chest pain, chest tightness, palpitations or swelling in the hands.  Gastrointestinal: Denies abdominal pain, bloating, constipation, diarrhea or blood in the stool.  GU: Denies urgency, frequency, pain with urination, burning sensation, blood in urine, odor or discharge. Musculoskeletal: Denies decrease in range of motion, difficulty with gait, muscle pain or joint pain and swelling.  Skin: Patient reports dry skin.  Denies redness, rashes, lesions or ulcercations.  Neurological: Patient reports paresthesia of hands.  Denies dizziness, difficulty with memory, difficulty with speech or problems with balance and coordination.  Psych: Denies anxiety, depression, SI/HI.  No other specific complaints in a complete review of systems (except as listed in HPI above).     Objective:   Physical Exam  BP 108/62   Ht 5' (1.524 m)   Wt 255 lb (115.7 kg)   BMI 49.80 kg/m    Wt Readings from Last 3 Encounters:  04/19/23 260 lb (117.9 kg)  03/25/23 262 lb (118.8 kg)  03/10/23 260 lb (117.9 kg)    General: Appears her stated age, obese, in NAD. Skin: Warm, dry and intact. HEENT: Head: normal shape and size; Eyes: sclera white, no icterus, conjunctiva pink, PERRLA and EOMs intact;  Cardiovascular: Normal rate and rhythm. S1,S2 noted.  No murmur, rubs or gallops noted. No JVD.  No BLE edema.  Radial pulses 2+ bilaterally. Pulmonary/Chest: Normal effort and positive vesicular breath sounds.  No respiratory distress. No wheezes, rales or ronchi noted.  Musculoskeletal: Handgrips equal.  No difficulty with gait.  Neurological: Alert and oriented. Coordination normal.  Psychiatric: Mood and affect normal. Behavior is normal. Judgment and thought content normal.     BMET    Component Value Date/Time   NA 139 03/10/2023 0922   NA 142 06/27/2016 1412   NA 139 10/22/2011 1515   K 4.5 03/10/2023 0922   K 4.0 10/22/2011 1515   K 4.0 10/22/2011 1515   CL 107 03/10/2023 0922   CL 107 10/22/2011 1515   CL 107 10/22/2011 1515   CO2 22 03/10/2023 0922   CO2 27 10/22/2011 1515   CO2 27 10/22/2011 1515   GLUCOSE 86 03/10/2023 0922  GLUCOSE 85 10/22/2011 1515   BUN 19 03/10/2023 0922   BUN 13 06/27/2016 1412   BUN 14 10/22/2011 1515   CREATININE 0.67 03/10/2023 0922   CALCIUM 9.2 03/10/2023 0922   CALCIUM 8.9 10/22/2011 1515   CALCIUM 8.9 10/22/2011 1515   GFRNONAA >60 03/05/2023 0918   GFRNONAA >60 10/22/2011 1515   GFRAA >60 07/05/2016 0535   GFRAA >60 10/22/2011 1515    Lipid Panel     Component Value Date/Time   CHOL 158 03/10/2023 0922   CHOL 217 (H) 06/27/2016 1412   TRIG 160 (H) 03/10/2023 0922   HDL 50 03/10/2023 0922   HDL 37 (L) 06/27/2016 1412   CHOLHDL 3.2 03/10/2023 0922   LDLCALC 82 03/10/2023 0922    CBC    Component Value Date/Time   WBC 7.5 03/05/2023 0918   RBC 4.70 03/05/2023 0918   HGB 12.8 03/05/2023 0918   HGB 13.6 06/27/2016 1412   HCT 39.5 03/05/2023 0918   HCT 41.5 06/27/2016 1412   HCT 39 10/22/2011 1515   PLT 313 03/05/2023 0918   PLT 358 06/27/2016 1412   MCV 84.0 03/05/2023 0918   MCV 85 06/27/2016 1412   MCV 85 10/22/2011 1515   MCH 27.2 03/05/2023 0918   MCHC 32.4 03/05/2023 0918   RDW 13.8 03/05/2023 0918   RDW 14.0 06/27/2016 1412   RDW 13.6 10/22/2011 1515   LYMPHSABS 2.8 06/19/2020 1144   MONOABS 0.8 06/19/2020 1144   EOSABS 0.4 06/19/2020 1144   BASOSABS 0.1 06/19/2020 1144    Hgb A1C Lab Results  Component  Value Date   HGBA1C 5.5 03/10/2023            Assessment & Plan:   Dry skin:  Advised her to set an alarm on her phone and every time it goes off she needs to drink 8 ounces of water She declines checking kidney function at this time she does not feel like her dehydration is significant enough to be impairing her kidney function  RTC in 5 months for your annual exam Nicki Reaper, NP

## 2023-10-27 NOTE — Assessment & Plan Note (Signed)
 Will check lipid profile at annual exam Encouraged her to consume a low fat diet Continue atorvastatin

## 2023-10-27 NOTE — Assessment & Plan Note (Signed)
 She will continue to wear splints as needed

## 2023-10-27 NOTE — Assessment & Plan Note (Signed)
 Okay to continue Tylenol or Excedrin Migraine as needed We will monitor

## 2023-10-27 NOTE — Patient Instructions (Signed)

## 2023-10-27 NOTE — Assessment & Plan Note (Signed)
 Encouraged diet and exercise for weight loss ?

## 2023-10-27 NOTE — Assessment & Plan Note (Signed)
 She is not interested in refilling her albuterol inhaler at this time due to nonuse

## 2023-10-27 NOTE — Assessment & Plan Note (Signed)
 Continue ibuprofen as needed We will monitor

## 2024-02-05 LAB — HM MAMMOGRAPHY

## 2024-02-16 ENCOUNTER — Telehealth: Admitting: Physician Assistant

## 2024-02-16 DIAGNOSIS — R42 Dizziness and giddiness: Secondary | ICD-10-CM

## 2024-02-16 DIAGNOSIS — R062 Wheezing: Secondary | ICD-10-CM

## 2024-02-16 NOTE — Patient Instructions (Signed)
  Rashi Zan Thomas, thank you for joining [name], PA-C for today's virtual visit.  While this provider is not your primary care provider (PCP), if your PCP is located in our provider database this encounter information will be shared with them immediately following your visit.   A [facility] MyChart account gives you access to today's visit and all your visits, tests, and labs performed at [facility] " click here if you don't have a [facility] MyChart account or go to [email or web address]  Consent: (Patient) Deanna Thomas provided verbal consent for this virtual visit at the beginning of the encounter.  Current Medications:  Current Outpatient Medications:    albuterol  (VENTOLIN  HFA) 108 (90 Base) MCG/ACT inhaler, Inhale 2 puffs into the lungs every 6 (six) hours as needed for wheezing or shortness of breath., Disp: 8 g, Rfl: 0   atorvastatin  (LIPITOR) 20 MG tablet, TAKE 1 TABLET BY MOUTH EVERY DAY, Disp: 90 tablet, Rfl: 3   ibuprofen  (ADVIL ) 200 MG tablet, Take 200 mg by mouth every 6 (six) hours as needed for fever, headache or moderate pain., Disp: , Rfl:    loratadine (CLARITIN) 10 MG tablet, Take 10 mg by mouth daily., Disp: , Rfl:    Medications ordered in this encounter:  No orders of the defined types were placed in this encounter.    *If you need refills on other medications prior to your next appointment, please contact your pharmacy*  Follow-Up: Call back or seek an in-person evaluation if the symptoms worsen or if the condition fails to improve as anticipated.  [facility] [phone]  Other Instructions  If you have been instructed to have an in-person evaluation today at a local Urgent Care facility, please use the link below. It will take you to a list of all of our available [facility], including address, phone number and hours of operation. Please do not delay care.  [facility]      If  you are having a true medical emergency please call 911.      Emergency Department-[facility]  Get Driving Directions  [phone]  [address]  [place], [place] [address]  Open 24/7/365      [facility] Emergency Department at [facility]  Get Driving Directions  [address]  [place], [place] [address]  Open 24/7/365    Emergency Department- [facility]  Get Driving Directions  [phone]  [address]  [place], [place] [address]  Open 24/7/365      [facility] at [facility]  Get Driving Directions  [phone]  [address]  [place], [place] [address]  Open 24/7/365    [facility]  Emergency Department- [facility]  Get Driving Directions  [phone]  [address]  [place], [place] [address]  Open 24/7/365    [place]  Emergency Department- [facility]  Get Driving Directions  [address]  [place], [place] [address]  Open 24/7/365    [facility]  Emergency Department- [facility]  Get Driving Directions  [phone]  [address]  [place], [place] [address]  Open 24/7/365

## 2024-02-16 NOTE — Progress Notes (Signed)
 Virtual Visit Consent   [name], you are scheduled for a virtual visit with a [facility] provider today. Just as with appointments in the office, your consent must be obtained to participate. Your consent will be active for this visit and any virtual visit you may have with one of our providers in the next 365 days. If you have a MyChart account, a copy of this consent can be sent to you electronically.  As this is a virtual visit, video technology does not allow for your provider to perform a traditional examination. This may limit your provider's ability to fully assess your condition. If your provider identifies any concerns that need to be evaluated in person or the need to arrange testing (such as labs, EKG, etc.), we will make arrangements to do so. Although advances in technology are sophisticated, we cannot ensure that it will always work on either your end or our end. If the connection with a video visit is poor, the visit may have to be switched to a telephone visit. With either a video or telephone visit, we are not always able to ensure that we have a secure connection.  By engaging in this virtual visit, you consent to the provision of healthcare and authorize for your insurance to be billed (if applicable) for the services provided during this visit. Depending on your insurance coverage, you may receive a charge related to this service.  I need to obtain your verbal consent now. Are you willing to proceed with your visit today? [name] has provided verbal consent on 02/16/2024 for a virtual visit (video or telephone). Deanna Thomas, [place]  Date: 02/16/2024 2:29 PM   Virtual Visit via Video Note   I, Deanna Thomas, connected with  [name]  ([id number], 08/03/1981) on 02/16/24 at  2:30 PM EDT by a video-enabled telemedicine application and verified that I am speaking with the correct person using two identifiers.  Location: Patient: Virtual Visit  Location Patient: Home Provider: Virtual Visit Location Provider: Home Office   I discussed the limitations of evaluation and management by telemedicine and the availability of in person appointments. The patient expressed understanding and agreed to proceed.    History of Present Illness: Deanna Thomas is a 43 y.o. who identifies as a female who was assigned female at birth, and is being seen today for a few days of progressively worsening dry cough , chest tightness and wheezing, now with SOB not fully alleviated with her albuterol  inhaler. Denies fever, chills. Denies chest pain. Note some lightheadedness with positional change and noting she has not had much to eat or drink in the past few days.  HPI: HPI  Problems:  Patient Active Problem List   Diagnosis Date Noted   Mixed hyperlipidemia 02/10/2022   Class 3 severe obesity due to excess calories with body mass index (BMI) of 45.0 to 49.9 in adult 02/10/2022   Plantar fasciitis, bilateral 04/17/2015   CARPAL TUNNEL SYNDROME, BILATERAL 07/17/2008   Migraine 11/05/2006   Asthma 11/05/2006    Allergies:  Allergies  Allergen Reactions   Imitrex [Sumatriptan Succinate] Rash and Other (See Comments)    FEELS LIKE SKIN IS BURNING   Bupropion  Other (See Comments)    CAUSED A PANIC ATTACK   Medications:  Current Outpatient Medications:    albuterol  (VENTOLIN  HFA) 108 (90 Base) MCG/ACT inhaler, Inhale 2 puffs into the lungs every 6 (six) hours as needed for wheezing or shortness of breath., Disp:  8 g, Rfl: 0   atorvastatin  (LIPITOR) 20 MG tablet, TAKE 1 TABLET BY MOUTH EVERY DAY, Disp: 90 tablet, Rfl: 3   ibuprofen  (ADVIL ) 200 MG tablet, Take 200 mg by mouth every 6 (six) hours as needed for fever, headache or moderate pain., Disp: , Rfl:    loratadine (CLARITIN) 10 MG tablet, Take 10 mg by mouth daily., Disp: , Rfl:   Observations/Objective: Patient is sitting and in no acute respiratory distress.  Head is normocephalic,  atraumatic.  No labored breathing. Is speaking in complete sentences but with occasional audible wheeze. Speech is clear and coherent with logical content.  Patient is alert and oriented at baseline.   Assessment and Plan: 1. Wheezing (Primary)  2. Lightheadedness  Giving substantial chest tightness coupled with poor hydration, SOBOE and lightheadedness, she needs an in-person evaluation ASAP. Does not have pulse oximeter to better assess oxygenation at this moment. She agrees to be evaluated in person ASAP at nearest UC/ER if her PCP is not available (she wants to call them first).   Follow Up Instructions: I discussed the assessment and treatment plan with the patient. The patient was provided an opportunity to ask questions and all were answered. The patient agreed with the plan and demonstrated an understanding of the instructions.  A copy of instructions were sent to the patient via MyChart unless otherwise noted below.   The patient was advised to call back or seek an in-person evaluation if the symptoms worsen or if the condition fails to improve as anticipated.    Deanna Maillard, PA-C

## 2024-02-18 ENCOUNTER — Ambulatory Visit
Admission: EM | Admit: 2024-02-18 | Discharge: 2024-02-18 | Disposition: A | Discharge status: Home / Self Care | Attending: Family Medicine | Admitting: Family Medicine

## 2024-02-18 DIAGNOSIS — J4531 Mild persistent asthma with (acute) exacerbation: Secondary | ICD-10-CM

## 2024-02-18 MED ORDER — PREDNISONE 20 MG PO TABS: ORAL_TABLET | ORAL | 0 refills | Status: DC

## 2024-02-18 MED ORDER — IPRATROPIUM-ALBUTEROL 0.5-2.5 (3) MG/3ML IN SOLN
3.0000 mL | Freq: Once | RESPIRATORY_TRACT | Status: AC
  Administered 2024-02-18: 3 mL via RESPIRATORY_TRACT

## 2024-02-18 MED ORDER — ALBUTEROL SULFATE HFA 108 (90 BASE) MCG/ACT IN AERS: 2.0000 | INHALATION_SPRAY | RESPIRATORY_TRACT | 0 refills | Status: AC | PRN

## 2024-02-18 NOTE — ED Triage Notes (Signed)
 Pt states coughing for the past 3 days which has triggered her asthma. Pt feeling tight and SOB since this morning.  State she used her albuterol  inhaler at home with some relief.

## 2024-02-18 NOTE — ED Provider Notes (Signed)
 [facility]  Note:  This document was prepared using Conservation officer, historic buildings and may include unintentional dictation errors.  MRN: [id number] DOB: April 10, 1981  Subjective:   Deanna Thomas is a 43 y.o. female presenting for 4-day history of persistent chest tightness, chest wheezing, shortness of breath worse today.  Has been using her albuterol  inhaler consistently.  Does not use a nebulizer at home.  Reports that generally she does not have a difficult time with her asthma except for when she feels ill or in the spring and summer.  No fever, sinus symptoms, malaise or fatigue, body pains.  No rash.  No smoking of any kind including cigarettes, cigars, vaping, marijuana use.  Needs a refill on her albuterol  inhaler.  No current facility-administered medications for this encounter.  Current Outpatient Medications:    albuterol  (VENTOLIN  HFA) 108 (90 Base) MCG/ACT inhaler, Inhale 2 puffs into the lungs every 6 (six) hours as needed for wheezing or shortness of breath., Disp: 8 g, Rfl: 0   atorvastatin  (LIPITOR) 20 MG tablet, TAKE 1 TABLET BY MOUTH EVERY DAY, Disp: 90 tablet, Rfl: 3   ibuprofen  (ADVIL ) 200 MG tablet, Take 200 mg by mouth every 6 (six) hours as needed for fever, headache or moderate pain., Disp: , Rfl:    loratadine (CLARITIN) 10 MG tablet, Take 10 mg by mouth daily., Disp: , Rfl:    Allergies  Allergen Reactions   Imitrex [Sumatriptan Succinate] Rash and Other (See Comments)    FEELS LIKE SKIN IS BURNING   Bupropion  Other (See Comments)    CAUSED A PANIC ATTACK    Past Medical History:  Diagnosis Date   Allergy    Asthma    Complication of anesthesia    hard to wake up post-op   Cyst of finger 07/2013   right long annular ligament cyst   Depression    Difficulty swallowing pills    Migraine    Multiple body piercings    nose and both ears - unsure if she can remove for surgery     Past Surgical History:  Procedure  Laterality Date   ABDOMINAL SURGERY     for ectopic pregnancy   CESAREAN SECTION  02/28/3005; 01/04/2008   ECTOPIC PREGNANCY SURGERY Left 01/28/2006   mini laparotomy; partial left salpingectomy   GANGLION CYST EXCISION Right 08/2021   Right Wrist   MASS EXCISION Right 08/01/2013   Procedure: EXCISION MASS RIGHT LONG FINGER;  Surgeon: Milagros Alf, MD;  Location: [facility];  Service: Orthopedics;  Laterality: Right;   TUBAL LIGATION Right 01/04/2008    Family History  Problem Relation Age of Onset   Cataracts Mother    Hypertension Mother    Breast cancer Maternal Grandmother    Heart disease Maternal Grandmother    Hypertension Maternal Grandmother    Diabetes Maternal Grandmother    Cancer Paternal Grandmother        ovarian/uterine? unsure   Breast cancer Maternal Aunt    Healthy Half-Sister     Social History   Tobacco Use   Smoking status: Never   Smokeless tobacco: Never  Vaping Use   Vaping status: Never Used  Substance Use Topics   Alcohol use: Yes    Comment: occasionally   Drug use: No    ROS   Objective:   Vitals: BP 139/81 (BP Location: Left Arm)   Pulse 88   Temp 98.2 F (36.8 C) (Oral)   Resp 18  LMP 02/06/2024 (Approximate)   SpO2 94%   Physical Exam Constitutional:      General: She is not in acute distress.    Appearance: Normal appearance. She is well-developed. She is not ill-appearing, toxic-appearing or diaphoretic.  HENT:     Head: Normocephalic and atraumatic.     Nose: Nose normal.     Mouth/Throat:     Mouth: Mucous membranes are moist.   Eyes:     General: No scleral icterus.       Right eye: No discharge.        Left eye: No discharge.     Extraocular Movements: Extraocular movements intact.    Cardiovascular:     Rate and Rhythm: Normal rate and regular rhythm.     Heart sounds: Normal heart sounds. No murmur heard.    No friction rub. No gallop.  Pulmonary:     Effort: Pulmonary effort is normal.  No respiratory distress.     Breath sounds: No stridor. Wheezing (throughout) present. No rhonchi or rales.  Chest:     Chest wall: No tenderness.   Skin:    General: Skin is warm and dry.   Neurological:     General: No focal deficit present.     Mental Status: She is alert and oriented to person, place, and time.   Psychiatric:        Mood and Affect: Mood normal.        Behavior: Behavior normal.    A 0.5mg -2.5mg  ipratropium-albuterol  nebulized solution was administered in clinic.  Pulmonary exam clear thereafter.   Assessment and Plan :   PDMP not reviewed this encounter.  1. Mild persistent asthma with (acute) exacerbation    Patient declined chest x-ray.  Recommended managing for an acute asthma exacerbation as above with prednisone .  Refilled her albuterol  inhaler.  Patient is following up with her PCP in 1 week.  Counseled patient on potential for adverse effects with medications prescribed/recommended today, ER and return-to-clinic precautions discussed, patient verbalized understanding.    Adolph Hoop, PA-C 02/18/24 1610

## 2024-02-25 ENCOUNTER — Ambulatory Visit (INDEPENDENT_AMBULATORY_CARE_PROVIDER_SITE_OTHER): Admitting: Internal Medicine

## 2024-02-25 ENCOUNTER — Encounter: Payer: Self-pay | Admitting: Internal Medicine

## 2024-02-25 VITALS — BP 128/82 | Ht 60.0 in | Wt 254.0 lb

## 2024-02-25 DIAGNOSIS — J4521 Mild intermittent asthma with (acute) exacerbation: Secondary | ICD-10-CM

## 2024-02-25 DIAGNOSIS — H6121 Impacted cerumen, right ear: Secondary | ICD-10-CM

## 2024-02-25 NOTE — Progress Notes (Signed)
 Subjective:    [name], female    DOB: 08/30/1981, 44 y.o.   MRN: [id number]  HPI  Discussed the use of AI scribe software for clinical note transcription with the patient, who gave verbal consent to proceed.  Dustine Lealer Thomas is a 43 year old female who presents with persistent right ear fullness and discomfort.  The ear issue began before she developed a cough and has persisted since. She describes the sensation as feeling like water is draining out of her ear, similar to after swimming, but notes that nothing is actually coming out. The fullness is localized to her right ear and extends into her head and jaw.  She initially sought urgent care on February 18, 2024, due to a cough and asthma exacerbation. At that time, she was experiencing coughing fits that led to asthma attacks, characterized by choking and difficulty breathing. She was prescribed prednisone  and an albuterol  inhaler, which have improved her respiratory symptoms.  No current nasal congestion, sore throat, or significant cough, though she occasionally coughs up mucus that is not clear but not overtly infectious. No nausea, vomiting, fever, chills, or body aches.  She has not taken any over-the-counter medications for her ear symptoms, as the urgent care focused on managing her asthma. She does not smoke.       Review of Systems  Past Medical History:  Diagnosis Date   Allergy    Asthma    Complication of anesthesia    hard to wake up post-op   Cyst of finger 07/2013   right long annular ligament cyst   Depression    Difficulty swallowing pills    Migraine    Multiple body piercings    nose and both ears - unsure if she can remove for surgery    Current Outpatient Medications  Medication Sig Dispense Refill   albuterol  (VENTOLIN  HFA) 108 (90 Base) MCG/ACT inhaler Inhale 2 puffs into the lungs every 4 (four) hours as needed for wheezing or shortness of breath. 18 g 0   atorvastatin   (LIPITOR) 20 MG tablet TAKE 1 TABLET BY MOUTH EVERY DAY 90 tablet 3   ibuprofen  (ADVIL ) 200 MG tablet Take 200 mg by mouth every 6 (six) hours as needed for fever, headache or moderate pain.     loratadine (CLARITIN) 10 MG tablet Take 10 mg by mouth daily.     predniSONE  (DELTASONE ) 20 MG tablet Take 2 tablets daily with breakfast. 10 tablet 0   No current facility-administered medications for this visit.    Allergies  Allergen Reactions   Imitrex [Sumatriptan Succinate] Rash and Other (See Comments)    FEELS LIKE SKIN IS BURNING   Bupropion  Other (See Comments)    CAUSED A PANIC ATTACK    Family History  Problem Relation Age of Onset   Cataracts Mother    Hypertension Mother    Breast cancer Maternal Grandmother    Heart disease Maternal Grandmother    Hypertension Maternal Grandmother    Diabetes Maternal Grandmother    Cancer Paternal Grandmother        ovarian/uterine? unsure   Breast cancer Maternal Aunt    Healthy Half-Sister     Social History   Socioeconomic History   Marital status: Married    Spouse name: Not on file   Number of children: Not on file   Years of education: Not on file   Highest education level: Not on file  Occupational History   Not on  file  Tobacco Use   Smoking status: Never   Smokeless tobacco: Never  Vaping Use   Vaping status: Never Used  Substance and Sexual Activity   Alcohol use: Yes    Comment: occasionally   Drug use: No   Sexual activity: Yes    Birth control/protection: Surgical  Other Topics Concern   Not on file  Social History Narrative   Not on file   Social Drivers of Health   Financial Resource Strain: Not on file  Food Insecurity: Not on file  Transportation Needs: Not on file  Physical Activity: Not on file  Stress: Not on file  Social Connections: Not on file  Intimate Partner Violence: Not on file     Constitutional: Patient reports intermittent headaches.  Denies fever, malaise, fatigue, or abrupt  weight changes.  HEENT: Pt reports right ear pain and fullness. Denies eye pain, eye redness, ear pain, ringing in the ears, wax buildup, runny nose, nasal congestion, bloody nose, or sore throat. Respiratory: Patient reports cough.  Denies difficulty breathing, shortness of breath or wheezing.   Cardiovascular: Pt reports intermittent swelling in legs. Denies chest pain, chest tightness, palpitations or swelling in the hands.  Gastrointestinal: Denies abdominal pain, bloating, constipation, diarrhea or blood in the stool.  GU: Denies urgency, frequency, pain with urination, burning sensation, blood in urine, odor or discharge. Musculoskeletal: Denies decrease in range of motion, difficulty with gait, muscle pain or joint pain and swelling.   No other specific complaints in a complete review of systems (except as listed in HPI above).     Objective:   Physical Exam BP 128/82 (BP Location: Left Arm, Patient Position: Sitting, Cuff Size: Large)   Ht 5' (1.524 m)   Wt 254 lb (115.2 kg)   LMP 02/06/2024 (Approximate)   BMI 49.61 kg/m    Wt Readings from Last 3 Encounters:  10/27/23 255 lb (115.7 kg)  04/19/23 260 lb (117.9 kg)  03/25/23 262 lb (118.8 kg)    General: Appears her stated age, obese, in NAD. Skin: Warm, dry and intact. HEENT: Head: normal shape and size; Eyes: sclera white, no icterus, conjunctiva pink, PERRLA and EOMs intact; Right Ear: cerumen impaction; Left Ear: TM gray and intact, normal light reflex. Neck: No adenopathy noted Cardiovascular: Normal rate and rhythm. S1,S2 noted.  No murmur, rubs or gallops noted. No JVD.  Trace BLE edema. Pulmonary/Chest: Normal effort and positive vesicular breath sounds. No respiratory distress. No wheezes, rales or ronchi noted.  Musculoskeletal:  No difficulty with gait.  Neurological: Alert and oriented.    BMET    Component Value Date/Time   NA 139 03/10/2023 0922   NA 142 06/27/2016 1412   NA 139 10/22/2011 1515   K 4.5  03/10/2023 0922   K 4.0 10/22/2011 1515   K 4.0 10/22/2011 1515   CL 107 03/10/2023 0922   CL 107 10/22/2011 1515   CL 107 10/22/2011 1515   CO2 22 03/10/2023 0922   CO2 27 10/22/2011 1515   CO2 27 10/22/2011 1515   GLUCOSE 86 03/10/2023 0922   GLUCOSE 85 10/22/2011 1515   BUN 19 03/10/2023 0922   BUN 13 06/27/2016 1412   BUN 14 10/22/2011 1515   CREATININE 0.67 03/10/2023 0922   CALCIUM  9.2 03/10/2023 0922   CALCIUM  8.9 10/22/2011 1515   CALCIUM  8.9 10/22/2011 1515   GFRNONAA >60 03/05/2023 0918   GFRNONAA >60 10/22/2011 1515   GFRAA >60 07/05/2016 0535   GFRAA >60 10/22/2011 1515  Lipid Panel     Component Value Date/Time   CHOL 158 03/10/2023 0922   CHOL 217 (H) 06/27/2016 1412   TRIG 160 (H) 03/10/2023 0922   HDL 50 03/10/2023 0922   HDL 37 (L) 06/27/2016 1412   CHOLHDL 3.2 03/10/2023 0922   LDLCALC 82 03/10/2023 0922    CBC    Component Value Date/Time   WBC 7.5 03/05/2023 0918   RBC 4.70 03/05/2023 0918   HGB 12.8 03/05/2023 0918   HGB 13.6 06/27/2016 1412   HCT 39.5 03/05/2023 0918   HCT 41.5 06/27/2016 1412   HCT 39 10/22/2011 1515   PLT 313 03/05/2023 0918   PLT 358 06/27/2016 1412   MCV 84.0 03/05/2023 0918   MCV 85 06/27/2016 1412   MCV 85 10/22/2011 1515   MCH 27.2 03/05/2023 0918   MCHC 32.4 03/05/2023 0918   RDW 13.8 03/05/2023 0918   RDW 14.0 06/27/2016 1412   RDW 13.6 10/22/2011 1515   LYMPHSABS 2.8 06/19/2020 1144   MONOABS 0.8 06/19/2020 1144   EOSABS 0.4 06/19/2020 1144   BASOSABS 0.1 06/19/2020 1144    Hgb A1C Lab Results  Component Value Date   HGBA1C 5.5 03/10/2023            Assessment & Plan:   Assessment and Plan    Cerumen impaction, right Right ear canal occluded with cerumen causing fullness and noise. Manual removal unsuccessful. - Perform ear irrigation. - Can use Debrox 2 times weekly to prevent further wax buildup  Asthma exacerbation Urgent care notes reviewed.  Exacerbation due to recent illness  treated with prednisone  and albuterol . Symptoms improved, occasional cough persists.  -Continue albuterol  as needed      RTC in 1 month for your annual exam Helayne Lo, NP

## 2024-02-25 NOTE — Patient Instructions (Signed)
 Earwax Buildup, Adult Your ears make something called earwax. It helps keep germs called bacteria away and protects the skin in your ears. Sometimes, too much earwax can build up. This can cause discomfort or make it harder to hear. What are the causes? Earwax buildup can happen when you have too much earwax in your ears. Earwax is made in the outer part of your ear canal. It's supposed to fall out in small amounts over time. But if your ears aren't able to clean themselves like they should, earwax can build up. What increases the risk? You're more likely to get earwax buildup if: You clean your ears with cotton swabs. You pick at your ears. You use earplugs or in-ear headphones a lot. You wear hearing aids. You may also be more likely to get it if: You're female. You're older. Your ears naturally make more earwax. You have narrow ear canals or extra hair in your ears. Your earwax is too thick or sticky. You have eczema. You're dehydrated. This means there's not enough fluid in your body. What are the signs or symptoms? Symptoms of earwax buildup include: Not being able to hear as well. A feeling of fullness in your ear. Feeling like your ear is plugged. Fluid coming from your ear. Ear pain or an itchy ear. Ringing in your ear. Coughing or problems with balance. How is this diagnosed? Earwax buildup may be diagnosed based on your symptoms, medical history, and an ear exam. During the exam, your health care provider will look into your ear with a tool called an otoscope. You may also have tests, such as a hearing test. How is this treated? Earwax buildup may be treated by: Using ear drops. Having the earwax removed by a provider. The provider may: Flush the ear with water. Use a tool called a curette that has a loop on the end. Use a suction device. Having surgery. This may be done in severe cases. Follow these instructions at home:  Cleaning your ears Clean your ears as told  by your provider. You can clean the outside of your ears with a washcloth or tissue. Do not overclean your ears. Do not put anything into your ear unless told. This includes cotton swabs. General instructions Take over-the-counter and prescription medicines only as told by your provider. Drink enough fluid to keep your pee (urine) pale yellow. This helps thin the earwax. If you have hearing aids, clean them as told. Keep all follow-up visits. If earwax builds up in your ears often or if you use hearing aids, ask your provider how often you should have your ears cleaned. Contact a health care provider if: Your ear pain gets worse. You have a fever. You have pus, blood, or other fluid coming from your ear. You have hearing loss. You have ringing in your ears that won't go away. You feel like the room is spinning. This is called vertigo. Your symptoms don't get better with treatment. This information is not intended to replace advice given to you by your health care provider. Make sure you discuss any questions you have with your health care provider. Document Revised: 11/06/2022 Document Reviewed: 11/06/2022 Elsevier Patient Education  2024 [employer].

## 2024-03-29 ENCOUNTER — Encounter: Payer: Self-pay | Admitting: Internal Medicine

## 2024-03-29 ENCOUNTER — Encounter: Payer: Self-pay | Admitting: Obstetrics and Gynecology

## 2024-03-29 ENCOUNTER — Ambulatory Visit (INDEPENDENT_AMBULATORY_CARE_PROVIDER_SITE_OTHER): Payer: BC Managed Care – PPO | Admitting: Internal Medicine

## 2024-03-29 VITALS — BP 128/78 | Ht 60.0 in | Wt 253.2 lb

## 2024-03-29 DIAGNOSIS — Z0001 Encounter for general adult medical examination with abnormal findings: Secondary | ICD-10-CM

## 2024-03-29 DIAGNOSIS — E782 Mixed hyperlipidemia: Secondary | ICD-10-CM

## 2024-03-29 DIAGNOSIS — E66813 Obesity, class 3: Secondary | ICD-10-CM | POA: Diagnosis not present

## 2024-03-29 DIAGNOSIS — R739 Hyperglycemia, unspecified: Secondary | ICD-10-CM

## 2024-03-29 DIAGNOSIS — Z6841 Body Mass Index (BMI) 40.0 and over, adult: Secondary | ICD-10-CM

## 2024-03-29 MED ORDER — PHENTERMINE-TOPIRAMATE ER 3.75-23 MG PO CP24: 1.0000 | ORAL_CAPSULE | Freq: Every day | ORAL | 0 refills | Status: DC

## 2024-03-29 NOTE — Patient Instructions (Signed)

## 2024-03-29 NOTE — Progress Notes (Signed)
 Subjective:    [name], female    DOB: 03-23-1981, 43 y.o.   MRN: [id number]  HPI  Patient presents to clinic today for her annual exam.  Flu: 10/2023 Tetanus: 02/2022 COVID: Alleen x 1 Pap smear: 10/2022 Mammogram: 02/2024 [facility] Vision screening: annually Dentist: biannually  Diet: She does eat meat. She consumes fruits and veggies. She does eat fried foods. She drinks mostly coffee, water, sweet tea and soda. Exercise: None  Review of Systems  Past Medical History:  Diagnosis Date   Allergy    Asthma    Complication of anesthesia    hard to wake up post-op   Cyst of finger 07/2013   right long annular ligament cyst   Depression    Difficulty swallowing pills    Migraine    Multiple body piercings    nose and both ears - unsure if she can remove for surgery    Current Outpatient Medications  Medication Sig Dispense Refill   albuterol  (VENTOLIN  HFA) 108 (90 Base) MCG/ACT inhaler Inhale 2 puffs into the lungs every 4 (four) hours as needed for wheezing or shortness of breath. 18 g 0   atorvastatin  (LIPITOR) 20 MG tablet TAKE 1 TABLET BY MOUTH EVERY DAY 90 tablet 3   ibuprofen  (ADVIL ) 200 MG tablet Take 200 mg by mouth every 6 (six) hours as needed for fever, headache or moderate pain.     loratadine (CLARITIN) 10 MG tablet Take 10 mg by mouth daily.     No current facility-administered medications for this visit.    Allergies  Allergen Reactions   Imitrex [Sumatriptan Succinate] Rash and Other (See Comments)    FEELS LIKE SKIN IS BURNING   Bupropion  Other (See Comments)    CAUSED A PANIC ATTACK    Family History  Problem Relation Age of Onset   Cataracts Mother    Hypertension Mother    Breast cancer Maternal Grandmother    Heart disease Maternal Grandmother    Hypertension Maternal Grandmother    Diabetes Maternal Grandmother    Cancer Paternal Grandmother        ovarian/uterine? unsure   Breast cancer Maternal Aunt     Healthy Half-Sister     Social History   Socioeconomic History   Marital status: Married    Spouse name: Not on file   Number of children: Not on file   Years of education: Not on file   Highest education level: Not on file  Occupational History   Not on file  Tobacco Use   Smoking status: Never   Smokeless tobacco: Never  Vaping Use   Vaping status: Never Used  Substance and Sexual Activity   Alcohol use: Yes    Comment: occasionally   Drug use: No   Sexual activity: Yes    Birth control/protection: Surgical  Other Topics Concern   Not on file  Social History Narrative   Not on file   Social Drivers of Health   Financial Resource Strain: Not on file  Food Insecurity: Not on file  Transportation Needs: Not on file  Physical Activity: Not on file  Stress: Not on file  Social Connections: Not on file  Intimate Partner Violence: Not on file     Constitutional: Patient reports intermittent headaches.  Denies fever, malaise, fatigue, or abrupt weight changes.  HEENT: Denies eye pain, eye redness, ear pain, ringing in the ears, wax buildup, runny nose, nasal congestion, bloody nose, or sore throat.  Respiratory: Denies difficulty breathing, shortness of breath, cough or sputum production.   Cardiovascular: Pt reports intermittent swelling in legs. Denies chest pain, chest tightness, palpitations or swelling in the hands.  Gastrointestinal: Denies abdominal pain, bloating, constipation, diarrhea or blood in the stool.  GU: Denies urgency, frequency, pain with urination, burning sensation, blood in urine, odor or discharge. Musculoskeletal: Denies decrease in range of motion, difficulty with gait, muscle pain or joint pain and swelling.  Skin: Pt reports skin lesion of breast. Denies redness, rashes, or ulcercations.  Neurological: Patient reports paresthesia of hands.  Denies dizziness, difficulty with memory, difficulty with speech or problems with balance and  coordination.  Psych: Pt reports irritablity. Denies anxiety, depression, SI/HI.  No other specific complaints in a complete review of systems (except as listed in HPI above).     Objective:   Physical Exam  BP 128/78 (BP Location: Left Arm, Patient Position: Sitting, Cuff Size: Large)   Ht 5' (1.524 m)   Wt 253 lb 3.2 oz (114.9 kg)   LMP 02/28/2024 (Approximate)   BMI 49.45 kg/m    Wt Readings from Last 3 Encounters:  02/25/24 254 lb (115.2 kg)  10/27/23 255 lb (115.7 kg)  04/19/23 260 lb (117.9 kg)    General: Appears her stated age, obese, in NAD. Skin: Warm, dry and intact. HEENT: Head: normal shape and size; Eyes: sclera white, no icterus, conjunctiva pink, PERRLA and EOMs intact;  Neck:  Neck supple, trachea midline. No masses, lumps or thyromegaly present.  Cardiovascular: Normal rate and rhythm. S1,S2 noted.  No murmur, rubs or gallops noted. No JVD.  Trace BLE edema. Pulmonary/Chest: Normal effort and positive vesicular breath sounds. No respiratory distress. No wheezes, rales or ronchi noted.  Abdomen: Soft and nontender. Normal bowel sounds.  Musculoskeletal: Strength 5/5 BUE/BLE.  No difficulty with gait.  Neurological: Alert and oriented. Cranial nerves II-XII grossly intact. Coordination normal.  Psychiatric: Mood and affect normal. Behavior is normal. Judgment and thought content normal.     BMET    Component Value Date/Time   NA 139 03/10/2023 0922   NA 142 06/27/2016 1412   NA 139 10/22/2011 1515   K 4.5 03/10/2023 0922   K 4.0 10/22/2011 1515   K 4.0 10/22/2011 1515   CL 107 03/10/2023 0922   CL 107 10/22/2011 1515   CL 107 10/22/2011 1515   CO2 22 03/10/2023 0922   CO2 27 10/22/2011 1515   CO2 27 10/22/2011 1515   GLUCOSE 86 03/10/2023 0922   GLUCOSE 85 10/22/2011 1515   BUN 19 03/10/2023 0922   BUN 13 06/27/2016 1412   BUN 14 10/22/2011 1515   CREATININE 0.67 03/10/2023 0922   CALCIUM  9.2 03/10/2023 0922   CALCIUM  8.9 10/22/2011 1515    CALCIUM  8.9 10/22/2011 1515   GFRNONAA >60 03/05/2023 0918   GFRNONAA >60 10/22/2011 1515   GFRAA >60 07/05/2016 0535   GFRAA >60 10/22/2011 1515    Lipid Panel     Component Value Date/Time   CHOL 158 03/10/2023 0922   CHOL 217 (H) 06/27/2016 1412   TRIG 160 (H) 03/10/2023 0922   HDL 50 03/10/2023 0922   HDL 37 (L) 06/27/2016 1412   CHOLHDL 3.2 03/10/2023 0922   LDLCALC 82 03/10/2023 0922    CBC    Component Value Date/Time   WBC 7.5 03/05/2023 0918   RBC 4.70 03/05/2023 0918   HGB 12.8 03/05/2023 0918   HGB 13.6 06/27/2016 1412   HCT 39.5 03/05/2023 0918  HCT 41.5 06/27/2016 1412   HCT 39 10/22/2011 1515   PLT 313 03/05/2023 0918   PLT 358 06/27/2016 1412   MCV 84.0 03/05/2023 0918   MCV 85 06/27/2016 1412   MCV 85 10/22/2011 1515   MCH 27.2 03/05/2023 0918   MCHC 32.4 03/05/2023 0918   RDW 13.8 03/05/2023 0918   RDW 14.0 06/27/2016 1412   RDW 13.6 10/22/2011 1515   LYMPHSABS 2.8 06/19/2020 1144   MONOABS 0.8 06/19/2020 1144   EOSABS 0.4 06/19/2020 1144   BASOSABS 0.1 06/19/2020 1144    Hgb A1C Lab Results  Component Value Date   HGBA1C 5.5 03/10/2023            Assessment & Plan:   Preventative health maintenance:  Encouraged her to get a flu shot in the fall Tetanus UTD Encourage her to get her COVID-vaccine Pap smear UTD Mammogram UTD, will request copy Encouraged her to consume a balanced diet and exercise regimen Advised her to see an eye doctor and dentist annually Will check CBC, c-Met, lipid, A1c today  RTC in 6 months, follow-up chronic conditions Angeline Laura, NP

## 2024-03-29 NOTE — Assessment & Plan Note (Signed)
 Encouraged diet and exercise for weight loss ?

## 2024-03-30 ENCOUNTER — Other Ambulatory Visit (HOSPITAL_COMMUNITY): Payer: Self-pay

## 2024-03-30 ENCOUNTER — Ambulatory Visit: Payer: Self-pay | Admitting: Internal Medicine

## 2024-03-30 ENCOUNTER — Telehealth: Payer: Self-pay

## 2024-03-30 LAB — LIPID PANEL
Cholesterol: 161 mg/dL (ref <200)
HDL: 46 mg/dL — ABNORMAL LOW (ref >=50)
LDL Cholesterol (Calc): 85 mg/dL
Non-HDL Cholesterol (Calc): 115 mg/dL (ref <130)
Total CHOL/HDL Ratio: 3.5 (calc) (ref <5.0)
Triglycerides: 200 mg/dL — ABNORMAL HIGH (ref <150)

## 2024-03-30 LAB — COMPREHENSIVE METABOLIC PANEL WITH GFR
AG Ratio: 1.6 (calc) (ref 1.0–2.5)
ALT: 17 U/L (ref 6–29)
AST: 16 U/L (ref 10–30)
Albumin: 4.2 g/dL (ref 3.6–5.1)
Alkaline phosphatase (APISO): 74 U/L (ref 31–125)
BUN: 12 mg/dL (ref 7–25)
CO2: 25 mmol/L (ref 20–32)
Calcium: 9 mg/dL (ref 8.6–10.2)
Chloride: 105 mmol/L (ref 98–110)
Creat: 0.69 mg/dL (ref 0.50–0.99)
Globulin: 2.7 g/dL (ref 1.9–3.7)
Glucose, Bld: 86 mg/dL (ref 65–99)
Potassium: 4.7 mmol/L (ref 3.5–5.3)
Sodium: 139 mmol/L (ref 135–146)
Total Bilirubin: 0.5 mg/dL (ref 0.2–1.2)
Total Protein: 6.9 g/dL (ref 6.1–8.1)
eGFR: 112 mL/min/1.73m2 (ref >=60)

## 2024-03-30 LAB — HEMOGLOBIN A1C
Hgb A1c MFr Bld: 5.5 % (ref <5.7)
Mean Plasma Glucose: 111 mg/dL
eAG (mmol/L): 6.2 mmol/L

## 2024-03-30 LAB — CBC
HCT: 43.2 % (ref 35.0–45.0)
Hemoglobin: 13.6 g/dL (ref 11.7–15.5)
MCH: 26.9 pg — ABNORMAL LOW (ref 27.0–33.0)
MCHC: 31.5 g/dL — ABNORMAL LOW (ref 32.0–36.0)
MCV: 85.5 fL (ref 80.0–100.0)
MPV: 11.1 fL (ref 7.5–12.5)
Platelets: 323 Thousand/uL (ref 140–400)
RBC: 5.05 Million/uL (ref 3.80–5.10)
RDW: 14 % (ref 11.0–15.0)
WBC: 8.8 Thousand/uL (ref 3.8–10.8)

## 2024-03-30 NOTE — Telephone Encounter (Signed)
 Pharmacy Patient Advocate Encounter   Received notification from Onbase that prior authorization for Phentermine -Topiramate  ER 3.75-23MG  er capsules  is required/requested.   Insurance verification completed.   The patient is insured through [employer] .   Per test claim: PA required; PA submitted to above mentioned insurance via CoverMyMeds Key/confirmation #/EOC [id number] Status is pending

## 2024-03-30 NOTE — Telephone Encounter (Signed)
 Pharmacy Patient Advocate Encounter  Received notification from [facility] that Prior Authorization for Phentermine -Topiramate  ER 3.75-23MG  er capsules  has been APPROVED from 02/29/24 to 09/26/24. Ran test claim, Copay is $0. This test claim was processed through [facility]- copay amounts may vary at other pharmacies due to pharmacy/plan contracts, or as the patient moves through the different stages of their insurance plan.   PA #/Case ID/Reference #: [id number]

## 2024-04-24 ENCOUNTER — Other Ambulatory Visit: Payer: Self-pay | Admitting: Internal Medicine

## 2024-04-26 NOTE — Telephone Encounter (Signed)
 Requested Prescriptions  Pending Prescriptions Disp Refills   atorvastatin  (LIPITOR) 20 MG tablet [Pharmacy Med Name: ATORVASTATIN  20 MG TABLET] 90 tablet 3    Sig: TAKE 1 TABLET BY MOUTH EVERY DAY     Cardiovascular:  Antilipid - Statins Failed - 04/26/2024  4:26 PM      Failed - Lipid Panel in normal range within the last 12 months    Cholesterol, Total  Date Value Ref Range Status  06/27/2016 217 (H) 100 - 199 mg/dL Final   Cholesterol  Date Value Ref Range Status  03/29/2024 161 <200 mg/dL Final   LDL Cholesterol (Calc)  Date Value Ref Range Status  03/29/2024 85 mg/dL (calc) Final    Comment:    Reference range: <100 . Desirable range <100 mg/dL for primary prevention;   <70 mg/dL for patients with CHD or diabetic patients  with > or = 2 CHD risk factors. SABRA LDL-C is now calculated using the Martin-Hopkins  calculation, which is a validated novel method providing  better accuracy than the Friedewald equation in the  estimation of LDL-C.  Gladis APPLETHWAITE et al. SANDREA. 7986;689(80): 2061-2068  (http://education.[facility].com/faq/FAQ164)    HDL  Date Value Ref Range Status  03/29/2024 46 (L) > OR = 50 mg/dL Final  89/79/7982 37 (L) >39 mg/dL Final   Triglycerides  Date Value Ref Range Status  03/29/2024 200 (H) <150 mg/dL Final    Comment:    . If a non-fasting specimen was collected, consider repeat triglyceride testing on a fasting specimen if clinically indicated.  Veatrice et al. J. of Clin. Lipidol. 2015;9:129-169. SABRA          Passed - Patient is not pregnant      Passed - Valid encounter within last 12 months    Recent Outpatient Visits           4 weeks ago Encounter for general adult medical examination with abnormal findings   [facility] [place], [place] W, NP   2 months ago Mild intermittent asthma with exacerbation   [facility] [place], [place] W, NP   6 months ago Class 3 severe obesity due  to excess calories with serious comorbidity and body mass index (BMI) of 45.0 to 49.9 in adult [facility])   [place] [facility] [place], Angeline ORN, [place]

## 2024-05-03 ENCOUNTER — Encounter: Payer: Self-pay | Admitting: Internal Medicine

## 2024-05-03 MED ORDER — PHENTERMINE-TOPIRAMATE ER 3.75-23 MG PO CP24: 1.0000 | ORAL_CAPSULE | Freq: Every day | ORAL | 0 refills | Status: DC

## 2024-05-03 NOTE — Addendum Note (Signed)
 Addended by: ANTONETTE ANGELINE ORN on: 05/03/2024 12:49 PM   Modules accepted: Orders

## 2024-05-03 NOTE — Addendum Note (Signed)
 Addended by: ANTONETTE ANGELINE ORN on: 05/03/2024 01:42 PM   Modules accepted: Orders

## 2024-05-05 ENCOUNTER — Other Ambulatory Visit: Payer: Self-pay | Admitting: Internal Medicine

## 2024-05-06 NOTE — Telephone Encounter (Signed)
 Requested medication (s) are due for refill today: signed 05/03/24  Requested medication (s) are on the active medication list: yes  Last refill:  05/03/24 #30 0 refills  Future visit scheduled: no   Notes to clinic:  not delegated per protocol. Last ordered 05/03/24 . Duplicate request. Do you want to refill Rx? Receipt confirmed by pharmacy 05/03/24 at 1:42 pm.     Requested Prescriptions  Pending Prescriptions Disp Refills   QSYMIA  3.75-23 MG CP24 [Pharmacy Med Name: QSYMIA  3.75 MG-23 MG CAPSULE] 30 capsule 0    Sig: TAKE 1 CAPSULE BY MOUTH DAILY AT 12 NOON.     Not Delegated - Neurology: Anticonvulsants - Controlled - phentermine  / topiramate  Failed - 05/06/2024  3:34 PM      Failed - This refill cannot be delegated      Passed - Cr in normal range and within 360 days    Creat  Date Value Ref Range Status  03/29/2024 0.69 0.50 - 0.99 mg/dL Final         Passed - CO2 in normal range and within 360 days    CO2  Date Value Ref Range Status  03/29/2024 25 20 - 32 mmol/L Final  10/22/2011 27 mmol/L Final   Co2  Date Value Ref Range Status  10/22/2011 27 21 - 32 mmol/L Final         Passed - ALT in normal range and within 360 days    ALT  Date Value Ref Range Status  03/29/2024 17 6 - 29 U/L Final         Passed - AST in normal range and within 360 days    AST  Date Value Ref Range Status  03/29/2024 16 10 - 30 U/L Final         Passed - Glucose (serum) in normal range and within 360 days    Glucose  Date Value Ref Range Status  03/29/2015 80 mg/dL Final  97/86/7986 85 65 - 99 mg/dL Final   Glucose, Bld  Date Value Ref Range Status  03/29/2024 86 65 - 99 mg/dL Final    Comment:    .            Fasting reference interval .          Passed - K in normal range and within 360 days    Potassium  Date Value Ref Range Status  03/29/2024 4.7 3.5 - 5.3 mmol/L Final  10/22/2011 4.0 mmol/L Final  10/22/2011 4.0 3.5 - 5.1 mmol/L Final         Passed - Completed  PHQ-2 or PHQ-9 in the last 360 days      Passed - Patient is not pregnant      Passed - Last BP in normal range    BP Readings from Last 1 Encounters:  03/29/24 128/78         Passed - Last Heart Rate in normal range    Pulse Readings from Last 1 Encounters:  02/18/24 88         Passed - Valid encounter within last 6 months    Recent Outpatient Visits           1 month ago Encounter for general adult medical examination with abnormal findings    [facility] [place], [place] W, NP   2 months ago Mild intermittent asthma with exacerbation   [facility] [place], [place] ORN, NP   6 months ago  Class 3 severe obesity due to excess calories with serious comorbidity and body mass index (BMI) of 45.0 to 49.9 in adult [facility])   [place] [facility] [place], Angeline ORN, [place]

## 2024-06-14 ENCOUNTER — Encounter: Payer: Self-pay | Admitting: Internal Medicine

## 2024-06-14 MED ORDER — PHENTERMINE-TOPIRAMATE ER 7.5-46 MG PO CP24: 1.0000 | ORAL_CAPSULE | Freq: Every day | ORAL | 0 refills | Status: DC

## 2024-07-18 ENCOUNTER — Encounter: Payer: Self-pay | Admitting: Internal Medicine

## 2024-07-18 MED ORDER — PHENTERMINE-TOPIRAMATE ER 7.5-46 MG PO CP24: 1.0000 | ORAL_CAPSULE | Freq: Every day | ORAL | 0 refills | Status: DC

## 2024-09-28 ENCOUNTER — Ambulatory Visit: Admitting: Internal Medicine

## 2024-09-28 ENCOUNTER — Encounter: Payer: Self-pay | Admitting: Internal Medicine

## 2024-09-28 VITALS — BP 124/84 | Ht 60.0 in | Wt 246.0 lb

## 2024-09-28 DIAGNOSIS — J4520 Mild intermittent asthma, uncomplicated: Secondary | ICD-10-CM

## 2024-09-28 DIAGNOSIS — G5603 Carpal tunnel syndrome, bilateral upper limbs: Secondary | ICD-10-CM | POA: Diagnosis not present

## 2024-09-28 DIAGNOSIS — G43019 Migraine without aura, intractable, without status migrainosus: Secondary | ICD-10-CM | POA: Diagnosis not present

## 2024-09-28 DIAGNOSIS — E782 Mixed hyperlipidemia: Secondary | ICD-10-CM

## 2024-09-28 NOTE — Assessment & Plan Note (Signed)
 Continue albuterol  108 mcg per actuation every 8 hours as needed We will monitor

## 2024-09-28 NOTE — Assessment & Plan Note (Signed)
 Encouraged diet and exercise for weight loss Discussed oral wegovy however she would like to hold off at this time

## 2024-09-28 NOTE — Progress Notes (Signed)
 "  Subjective:    [name], female    DOB: August 20, 1981, 44 y.o.   MRN: [id number]  HPI  Patient presents to clinic today for 48-month follow-up of chronic conditions.  Migraines: These occur rarely.  Triggered by stress.  She takes tylenol , excedrin migraine as needed with some relief of symptoms.  She does not follow with neurology.  Asthma: Mild, intermittent.  She denies chronic cough but has intermittent shortness of breath with colds. Managed with albuterol  as needed.  There are no PFTs on file.  She does not follow with pulmonology.  HLD: Her last LDL was 85, triglycerides 799, 03/2024.  She denies myalgias on atorvastatin .  She tries to consume a low-fat diet.  Carpal tunnel: Improved after ganglion cyst removal on the right (2024). She is not currently taking any medication for this. She no longer wears wrist splints. She does not follow with neurology but does follow with hand specialist.  She also has a skin tag on the right side of her chest wall that she would like removed today.  She reports the area gets rubbed by her bra causing discomfort.  Review of Systems  Past Medical History:  Diagnosis Date   Allergy    Asthma    Complication of anesthesia    hard to wake up post-op   Cyst of finger 07/2013   right long annular ligament cyst   Depression    Difficulty swallowing pills    Migraine    Multiple body piercings    nose and both ears - unsure if she can remove for surgery    Current Outpatient Medications  Medication Sig Dispense Refill   albuterol  (VENTOLIN  HFA) 108 (90 Base) MCG/ACT inhaler Inhale 2 puffs into the lungs every 4 (four) hours as needed for wheezing or shortness of breath. 18 g 0   atorvastatin  (LIPITOR) 20 MG tablet TAKE 1 TABLET BY MOUTH EVERY DAY 90 tablet 3   ibuprofen  (ADVIL ) 200 MG tablet Take 200 mg by mouth every 6 (six) hours as needed for fever, headache or moderate pain.     loratadine (CLARITIN) 10 MG tablet Take 10  mg by mouth daily.     Phentermine -Topiramate  ER 7.5-46 MG CP24 Take 1 capsule by mouth daily at 12 noon. 30 capsule 0   No current facility-administered medications for this visit.    Allergies  Allergen Reactions   Imitrex [Sumatriptan Succinate] Rash and Other (See Comments)    FEELS LIKE SKIN IS BURNING   Bupropion  Other (See Comments)    CAUSED A PANIC ATTACK    Family History  Problem Relation Age of Onset   Cataracts Mother    Hypertension Mother    Breast cancer Maternal Grandmother    Heart disease Maternal Grandmother    Hypertension Maternal Grandmother    Diabetes Maternal Grandmother    Cancer Paternal Grandmother        ovarian/uterine? unsure   Breast cancer Maternal Aunt    Healthy Half-Sister     Social History   Socioeconomic History   Marital status: Married    Spouse name: Not on file   Number of children: Not on file   Years of education: Not on file   Highest education level: Not on file  Occupational History   Not on file  Tobacco Use   Smoking status: Never   Smokeless tobacco: Never  Vaping Use   Vaping status: Never Used  Substance and Sexual Activity  Alcohol use: Yes    Comment: occasionally   Drug use: No   Sexual activity: Yes    Birth control/protection: Surgical  Other Topics Concern   Not on file  Social History Narrative   Not on file   Social Drivers of Health   Tobacco Use: Low Risk (03/29/2024)   Patient History    Smoking Tobacco Use: Never    Smokeless Tobacco Use: Never    Passive Exposure: Not on file  Financial Resource Strain: Not on file  Food Insecurity: Not on file  Transportation Needs: Not on file  Physical Activity: Not on file  Stress: Not on file  Social Connections: Not on file  Intimate Partner Violence: Not on file  Depression (PHQ2-9): Medium Risk (03/29/2024)   Depression (PHQ2-9)    PHQ-2 Score: 8  Alcohol Screen: Low Risk (03/25/2023)   Alcohol Screen    Last Alcohol Screening Score  (AUDIT): 2  Housing: Not on file  Utilities: Not on file  Health Literacy: Not on file     Constitutional: Patient reports intermittent headaches.  Denies fever, malaise, fatigue, or abrupt weight changes.  HEENT: Denies eye pain, eye redness, ear pain, ringing in the ears, wax buildup, runny nose, nasal congestion, bloody nose, or sore throat. Respiratory: Denies difficulty breathing, shortness of breath, cough or sputum production.   Cardiovascular: Denies chest pain, chest tightness, palpitations or swelling in the hands or feet.  Gastrointestinal: Denies abdominal pain, bloating, constipation, diarrhea or blood in the stool.  GU: Denies urgency, frequency, pain with urination, burning sensation, blood in urine, odor or discharge. Musculoskeletal:  Denies decrease in range of motion, difficulty with gait, muscle pain or joint pain swelling.  Skin: Patient reports skin tag.  Denies redness, rashes, or ulcercations.  Neurological: Patient reports paresthesia of hands.  Denies dizziness, difficulty with memory, difficulty with speech or problems with balance and coordination.  Psych: Denies anxiety, depression, SI/HI.  No other specific complaints in a complete review of systems (except as listed in HPI above).     Objective:   Physical Exam  BP 124/84 (BP Location: Left Arm, Patient Position: Sitting, Cuff Size: Large)   Ht 5' (1.524 m)   Wt 246 lb (111.6 kg)   LMP 08/17/2024 (Exact Date)   BMI 48.04 kg/m     Wt Readings from Last 3 Encounters:  03/29/24 253 lb 3.2 oz (114.9 kg)  02/25/24 254 lb (115.2 kg)  10/27/23 255 lb (115.7 kg)    General: Appears her stated age, obese, in NAD. Skin: Warm, dry and intact.  3 mm skin tag noted of the right lateral chest wall. HEENT: Head: normal shape and size; Eyes: sclera white, no icterus, conjunctiva pink, PERRLA and EOMs intact;  Cardiovascular: Normal rate and rhythm. S1,S2 noted.  No murmur, rubs or gallops noted. No JVD.  No  BLE edema.   Pulmonary/Chest: Normal effort and positive vesicular breath sounds. No respiratory distress. No wheezes, rales or ronchi noted.  Musculoskeletal: Handgrips equal.  No difficulty with gait.  Neurological: Alert and oriented. Coordination normal.  Psychiatric: Mood and affect normal. Behavior is normal. Judgment and thought content normal.     BMET    Component Value Date/Time   NA 139 03/29/2024 1006   NA 142 06/27/2016 1412   NA 139 10/22/2011 1515   K 4.7 03/29/2024 1006   K 4.0 10/22/2011 1515   K 4.0 10/22/2011 1515   CL 105 03/29/2024 1006   CL 107 10/22/2011 1515  CL 107 10/22/2011 1515   CO2 25 03/29/2024 1006   CO2 27 10/22/2011 1515   CO2 27 10/22/2011 1515   GLUCOSE 86 03/29/2024 1006   GLUCOSE 85 10/22/2011 1515   BUN 12 03/29/2024 1006   BUN 13 06/27/2016 1412   BUN 14 10/22/2011 1515   CREATININE 0.69 03/29/2024 1006   CALCIUM  9.0 03/29/2024 1006   CALCIUM  8.9 10/22/2011 1515   CALCIUM  8.9 10/22/2011 1515   GFRNONAA >60 03/05/2023 0918   GFRNONAA >60 10/22/2011 1515   GFRAA >60 07/05/2016 0535   GFRAA >60 10/22/2011 1515    Lipid Panel     Component Value Date/Time   CHOL 161 03/29/2024 1006   CHOL 217 (H) 06/27/2016 1412   TRIG 200 (H) 03/29/2024 1006   HDL 46 (L) 03/29/2024 1006   HDL 37 (L) 06/27/2016 1412   CHOLHDL 3.5 03/29/2024 1006   LDLCALC 85 03/29/2024 1006    CBC    Component Value Date/Time   WBC 8.8 03/29/2024 1006   RBC 5.05 03/29/2024 1006   HGB 13.6 03/29/2024 1006   HGB 13.6 06/27/2016 1412   HCT 43.2 03/29/2024 1006   HCT 41.5 06/27/2016 1412   HCT 39 10/22/2011 1515   PLT 323 03/29/2024 1006   PLT 358 06/27/2016 1412   MCV 85.5 03/29/2024 1006   MCV 85 06/27/2016 1412   MCV 85 10/22/2011 1515   MCH 26.9 (L) 03/29/2024 1006   MCHC 31.5 (L) 03/29/2024 1006   RDW 14.0 03/29/2024 1006   RDW 14.0 06/27/2016 1412   RDW 13.6 10/22/2011 1515   LYMPHSABS 2.8 06/19/2020 1144   MONOABS 0.8 06/19/2020 1144    EOSABS 0.4 06/19/2020 1144   BASOSABS 0.1 06/19/2020 1144    Hgb A1C Lab Results  Component Value Date   HGBA1C 5.5 03/29/2024            Assessment & Plan:   Skin tag:  Procedure note:  Discussed risk and benefit of procedure including pain, bleeding, infection or scarring Informed consent obtained verbally Area cleansed with Betadine x 3 then wiped with alcohol Skin tag removed using a shave blade Area cleansed with normal saline, bleeding stopped with silver nitrate x 1 Covered with triple antibiotic ointment and Band-Aid Patient tolerated well, no complications Aftercare instructions provided  RTC in 6 months for your annual exam Angeline Laura, NP  "

## 2024-09-28 NOTE — Patient Instructions (Signed)
Skin Tag, Adult A skin tag (acrochordon) is a soft, extra growth of skin. Most skin tags are skin-colored and rarely bigger than a pencil eraser. They often form in areas where there is frequent rubbing, or friction, on the skin. This may be where there are folds in the skin, such as: The eyelids. The neck. The armpits. The groin. Skin tags are not dangerous, and they do not spread from person to person (are not contagious). You may have one skin tag or many. Skin tags do not need treatment. However, your health care provider may recommend removing a skin tag if it: Gets irritated from clothing or jewelry. Bleeds. Is visible and unsightly. What are the causes? This condition is linked to: Increasing age. Pregnancy. Diabetes. Obesity. What are the signs or symptoms? Skin tags usually do not cause symptoms unless they get irritated by items touching your skin, such as clothing or jewelry. When this happens, you may have pain, itching, or bleeding. How is this diagnosed? This condition is diagnosed with an evaluation from your health care provider. No testing is needed for diagnosis. How is this treated? Treatment for this condition depends on whether you have symptoms. Your health care provider may also remove your skin tag if it is visible or if you do not like the way it looks. A skin tag can be removed by a health care provider with: A simple surgical procedure using scissors. A procedure that involves freezing your skin tag with a gas in liquid form (liquid nitrogen). A procedure that uses heat to destroy your skin tag (electrodessication). Follow these instructions at home: Watch for any changes in your skin tag. A normal skin tag does not require any other special care at home. Take over-the-counter and prescription medicines only as told by your health care provider. Keep all follow-up visits. Contact a health care provider if: You have a skin tag that: Becomes painful. Changes  color. Bleeds. Swells. Summary Skin tags are soft, extra growths of skin found in areas of frequent rubbing or friction. Skin tags usually do not cause symptoms. If symptoms occur, you may have pain, itching, or bleeding. Your health care provider may remove your skin tag if it causes symptoms or if you do not like the way it looks. This information is not intended to replace advice given to you by your health care provider. Make sure you discuss any questions you have with your health care provider. Document Revised: 10/09/2021 Document Reviewed: 10/09/2021 Elsevier Patient Education  2024 [employer].

## 2024-09-28 NOTE — Assessment & Plan Note (Signed)
 Encouraged stress reduction techniques Okay to continue tylenol  or excedrin migraine as needed We will monitor

## 2024-09-28 NOTE — Assessment & Plan Note (Signed)
 Complicated by morbid obesity Will check lipid profile at annual exam Encouraged her to consume a low-fat diet Continue atorvastatin  20 mg daily

## 2024-09-28 NOTE — Assessment & Plan Note (Signed)
 She will follow-up with hand surgery as needed

## 2025-04-04 ENCOUNTER — Encounter: Admitting: Internal Medicine
